# Patient Record
Sex: Female | Born: 1990 | Race: White | Hispanic: No | Marital: Married | Smoking: Former smoker
Health system: Southern US, Community
[De-identification: ages and names within clinical notes are randomized; demographics above are authoritative.]

## PROBLEM LIST (undated history)

## (undated) ENCOUNTER — Inpatient Hospital Stay: Payer: Self-pay

## (undated) DIAGNOSIS — F419 Anxiety disorder, unspecified: Secondary | ICD-10-CM

## (undated) DIAGNOSIS — E079 Disorder of thyroid, unspecified: Secondary | ICD-10-CM

## (undated) DIAGNOSIS — F32A Depression, unspecified: Secondary | ICD-10-CM

## (undated) DIAGNOSIS — O1490 Unspecified pre-eclampsia, unspecified trimester: Secondary | ICD-10-CM

## (undated) DIAGNOSIS — F329 Major depressive disorder, single episode, unspecified: Secondary | ICD-10-CM

## (undated) HISTORY — DX: Depression, unspecified: F32.A

---
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
## 2009-01-09 ENCOUNTER — Emergency Department: Payer: Self-pay | Admitting: Emergency Medicine

## 2011-04-07 ENCOUNTER — Emergency Department (HOSPITAL_COMMUNITY): Payer: Self-pay

## 2011-04-07 ENCOUNTER — Emergency Department (HOSPITAL_COMMUNITY)
Admission: EM | Admit: 2011-04-07 | Discharge: 2011-04-07 | Disposition: A | Discharge status: Home / Self Care | Payer: Self-pay | Attending: Emergency Medicine | Admitting: Emergency Medicine

## 2011-04-07 DIAGNOSIS — M25519 Pain in unspecified shoulder: Secondary | ICD-10-CM | POA: Insufficient documentation

## 2011-04-07 DIAGNOSIS — M542 Cervicalgia: Secondary | ICD-10-CM | POA: Insufficient documentation

## 2011-04-07 DIAGNOSIS — W07XXXA Fall from chair, initial encounter: Secondary | ICD-10-CM | POA: Insufficient documentation

## 2011-04-07 DIAGNOSIS — S40029A Contusion of unspecified upper arm, initial encounter: Secondary | ICD-10-CM | POA: Insufficient documentation

## 2011-04-07 DIAGNOSIS — M79609 Pain in unspecified limb: Secondary | ICD-10-CM | POA: Insufficient documentation

## 2011-04-07 DIAGNOSIS — IMO0002 Reserved for concepts with insufficient information to code with codable children: Secondary | ICD-10-CM | POA: Insufficient documentation

## 2011-04-07 DIAGNOSIS — Y929 Unspecified place or not applicable: Secondary | ICD-10-CM | POA: Insufficient documentation

## 2011-04-07 DIAGNOSIS — F17200 Nicotine dependence, unspecified, uncomplicated: Secondary | ICD-10-CM | POA: Insufficient documentation

## 2012-06-24 ENCOUNTER — Observation Stay: Payer: Self-pay | Admitting: Obstetrics and Gynecology

## 2012-06-24 LAB — URINALYSIS, COMPLETE
Bacteria: NONE SEEN
Bacteria: NONE SEEN
Bilirubin,UR: NEGATIVE
Blood: NEGATIVE
Glucose,UR: NEGATIVE mg/dL (ref 0–75)
Glucose,UR: >=500 mg/dL (ref 0–75)
Leukocyte Esterase: NEGATIVE
Leukocyte Esterase: NEGATIVE
Nitrite: NEGATIVE
Ph: 8 (ref 4.5–8.0)
Ph: 8 (ref 4.5–8.0)
RBC,UR: <1 /HPF (ref 0–5)
Specific Gravity: 1.012 (ref 1.003–1.030)
Squamous Epithelial: NONE SEEN
WBC UR: 1 /HPF (ref 0–5)

## 2012-06-24 LAB — BASIC METABOLIC PANEL
Anion Gap: 10 (ref 7–16)
Calcium, Total: 8.5 mg/dL (ref 8.5–10.1)
Co2: 21 mmol/L (ref 21–32)
EGFR (African American): >60
Osmolality: 273 (ref 275–301)
Sodium: 139 mmol/L (ref 136–145)

## 2012-08-26 ENCOUNTER — Observation Stay: Payer: Self-pay | Admitting: Obstetrics and Gynecology

## 2012-08-26 LAB — URINALYSIS, COMPLETE
Glucose,UR: 50 mg/dL (ref 0–75)
Nitrite: NEGATIVE
RBC,UR: 1 /HPF (ref 0–5)
Specific Gravity: 1.005 (ref 1.003–1.030)
WBC UR: 8 /HPF (ref 0–5)

## 2012-08-26 LAB — CBC WITH DIFFERENTIAL/PLATELET
Basophil %: 0.1 %
Eosinophil #: 0.2 10*3/uL (ref 0.0–0.7)
Eosinophil %: 1.5 %
MCH: 29.2 pg (ref 26.0–34.0)
MCHC: 34.1 g/dL (ref 32.0–36.0)
MCV: 86 fL (ref 80–100)
Neutrophil #: 7.7 10*3/uL — ABNORMAL HIGH (ref 1.4–6.5)
Neutrophil %: 69.8 %
Platelet: 343 10*3/uL (ref 150–440)
RBC: 3.7 10*6/uL — ABNORMAL LOW (ref 3.80–5.20)
RDW: 14.7 % — ABNORMAL HIGH (ref 11.5–14.5)

## 2012-10-01 ENCOUNTER — Observation Stay: Payer: Self-pay

## 2012-10-02 ENCOUNTER — Inpatient Hospital Stay: Payer: Self-pay | Admitting: Obstetrics and Gynecology

## 2012-10-02 LAB — CBC WITH DIFFERENTIAL/PLATELET
Eosinophil %: 1.2 %
HCT: 40.3 % (ref 35.0–47.0)
HGB: 13.9 g/dL (ref 12.0–16.0)
Lymphocyte #: 3 10*3/uL (ref 1.0–3.6)
MCH: 29.7 pg (ref 26.0–34.0)
MCV: 86 fL (ref 80–100)
Monocyte #: 1.2 x10 3/mm — ABNORMAL HIGH (ref 0.2–0.9)
Monocyte %: 8 %
Neutrophil #: 10 10*3/uL — ABNORMAL HIGH (ref 1.4–6.5)
Platelet: 332 10*3/uL (ref 150–440)
RBC: 4.69 10*6/uL (ref 3.80–5.20)
RDW: 15.3 % — ABNORMAL HIGH (ref 11.5–14.5)
WBC: 14.3 10*3/uL — ABNORMAL HIGH (ref 3.6–11.0)

## 2012-10-03 LAB — HEMATOCRIT: HCT: 33.2 % — ABNORMAL LOW (ref 35.0–47.0)

## 2012-12-28 ENCOUNTER — Emergency Department: Payer: Self-pay | Admitting: Internal Medicine

## 2015-05-10 NOTE — H&P (Signed)
L&D Evaluation:  History:   HPI 24 yo G1P0 @ 23.6wks EDC 10/16/12 by LMP presents from the office with nausea, vomiting and diarrhea for 2 days.  Does not note an inciting event.  No sick contacts.  Has not been able to tolerate po for 2 days.  Ketonuria in the office.  Has not vomited since admission.  Has been receiving antiemetics and fluids since admission.  Has had 2 episodes of diarrhea.  Pregnancy otherwise uncomplicated.    Presents with nausea/vomiting, diarrhea    Patient's Medical History No Chronic Illness    Patient's Surgical History Wisdom teeth    Medications Pre Natal Vitamins    Allergies NKDA    Social History none    Family History DM   ROS:   ROS per HPI, all others neg   Exam:   Vital Signs stable    Urine Protein Neg on UA    General no apparent distress    Mental Status clear    Chest clear    Heart normal sinus rhythm    Abdomen gravid, non-tender    Pelvic Deferred    Mebranes Intact    FHT 150s baseline    Ucx absent    Skin dry   Impression:   Impression 23.6wk IUP, probable viral gastroenteritis   Plan:   Plan fluids    Comments 1)  Antiemetics.  Awaiting collection of another UA for evaluation of ketones.  Will po challenge.  If able to tolerate and ketones cleared, will d/c home with antiemetics. 2)  Pt understands that only time with viral illnesses will make symptoms better.   Electronic Signatures: Senaida LangeWeaver-Lee, Kammie Scioli (MD)  (Signed 25-Jun-13 20:32)  Authored: L&D Evaluation   Last Updated: 25-Jun-13 20:32 by Senaida LangeWeaver-Lee, Sosaia Pittinger (MD)

## 2015-05-10 NOTE — H&P (Signed)
L&D Evaluation:  History:   HPI 24 yo G1P0 at 37 weeks 6 days presents to L&D with c/o ctxs. EDD 10/16/12. Had intercourse last night and ctx's started after that. No LOF, [facility], no significant events during pregnancy.    Presents with contractions    Patient's Medical History No Chronic Illness    Patient's Surgical History none    Medications Pre Natal Vitamins    Allergies NKDA    Social History none    Family History Non-Contributory   ROS:   ROS All systems were reviewed.  HEENT, CNS, GI, GU, Respiratory, CV, Renal and Musculoskeletal systems were found to be normal.   Exam:   Vital Signs stable    General no apparent distress    Mental Status clear    Abdomen gravid, tender with contractions    Estimated Fetal Weight Average for gestational age    Pelvic no external lesions, 1/50/-3    Mebranes Intact    FHT normal rate with no decels    Ucx regular, 2-5   Impression:   Impression contractions at 37+ weeks   Plan:   Plan EFM/NST, monitor contractions and for cervical change, discharge, IM demerol and phenergan, rest, dc home    Comments cervix unchanged after several checks, pt tearful and in pain, had discussion about labor ctx's vs false labor. pt verbalized understanding. Will give pain meds and phenergan to go home with. has appt in 2 days at office   Electronic Signatures: Shella Maximutnam, Shyam Dawson (CNM)  (Signed 03-Oct-13 05:45)  Authored: L&D Evaluation   Last Updated: 03-Oct-13 05:45 by Shella MaximPutnam, Hinata Diener (CNM)

## 2015-05-10 NOTE — H&P (Signed)
L&D Evaluation:  History:   HPI 24 yo G1P0 @ 32wks 5d days gestation by LMP derived EDC of 10/16/12 presents with contractions and abdominal pain.  No fevers, no chills, no nausea, no emesis. +FM, no LOF, no VB    Presents with contractions    Patient's Medical History No Chronic Illness    Patient's Surgical History Wisdom teeth    Medications Pre Natal Vitamins    Allergies NKDA    Social History none    Family History DM   Exam:   Vital Signs stable    Urine Protein trace    General no apparent distress    Mental Status clear    Heart normal sinus rhythm    Estimated Fetal Weight Average for gestational age    Back no CVAT    Edema no edema    Reflexes 1+    Pelvic no external lesions, cervix closed and thick    Mebranes Intact    FHT normal rate with no decels    FHT Description irritability    Ucx absent   Impression:   Impression Preterm contraction   Plan:   Plan EFM/NST, monitor contractions and for cervical change    Comments - reactive NST - No change over prolonged monitoring - UA and CBC negative - Pain improved after IV hydration - Has follow up tomorrow    Follow Up Appointment already scheduled   Electronic Signatures: Lorrene ReidStaebler, Rhenda Oregon M (MD)  (Signed 27-Aug-13 17:45)  Authored: L&D Evaluation   Last Updated: 27-Aug-13 17:45 by Lorrene ReidStaebler, Lukisha Procida M (MD)

## 2015-05-10 NOTE — H&P (Signed)
L&D Evaluation:  History:   HPI 24 yo G1P0 at 2538 weeks presents to L&D with SROM. EDD 10/16/12. Has had ctx's off an on for a few days, was here yesterday morning with no cervical change, rec'd pain meds and sent home to rest.  [facility], no significant events during pregnancy.    Presents with contractions    Patient's Medical History No Chronic Illness    Patient's Surgical History none    Medications Pre Natal Vitamins    Allergies NKDA    Social History none    Family History Non-Contributory   ROS:   ROS All systems were reviewed.  HEENT, CNS, GI, GU, Respiratory, CV, Renal and Musculoskeletal systems were found to be normal.   Exam:   Vital Signs stable    General no apparent distress    Mental Status clear    Abdomen gravid, tender with contractions    Estimated Fetal Weight Average for gestational age    Pelvic no external lesions, 1.5/50/-2 per RN    Mebranes Ruptured    Description clear    FHT normal rate with no decels    Ucx regular, 2-5   Impression:   Impression early labor, SROM 38 weeks   Plan:   Plan EFM/NST, antibiotics for GBBS prophylaxis, admit for labor, will check for cervical change in a few hours, may need Pitocin   Electronic Signatures: Shella Maximutnam, Jyrah Blye (CNM)  (Signed 03-Oct-13 05:44)  Authored: L&D Evaluation   Last Updated: 03-Oct-13 05:44 by Shella MaximPutnam, Lydiah Pong (CNM)

## 2015-08-11 ENCOUNTER — Encounter (HOSPITAL_COMMUNITY): Payer: Self-pay | Admitting: Emergency Medicine

## 2015-08-11 ENCOUNTER — Emergency Department (HOSPITAL_COMMUNITY): Payer: Self-pay

## 2015-08-11 ENCOUNTER — Emergency Department (HOSPITAL_COMMUNITY)
Admission: EM | Admit: 2015-08-11 | Discharge: 2015-08-11 | Disposition: A | Discharge status: Home / Self Care | Payer: Self-pay | Attending: Emergency Medicine | Admitting: Emergency Medicine

## 2015-08-11 DIAGNOSIS — M549 Dorsalgia, unspecified: Secondary | ICD-10-CM | POA: Insufficient documentation

## 2015-08-11 DIAGNOSIS — S2232XA Fracture of one rib, left side, initial encounter for closed fracture: Secondary | ICD-10-CM | POA: Insufficient documentation

## 2015-08-11 DIAGNOSIS — R51 Headache: Secondary | ICD-10-CM | POA: Insufficient documentation

## 2015-08-11 DIAGNOSIS — X58XXXA Exposure to other specified factors, initial encounter: Secondary | ICD-10-CM | POA: Insufficient documentation

## 2015-08-11 DIAGNOSIS — M542 Cervicalgia: Secondary | ICD-10-CM | POA: Insufficient documentation

## 2015-08-11 DIAGNOSIS — R110 Nausea: Secondary | ICD-10-CM | POA: Insufficient documentation

## 2015-08-11 DIAGNOSIS — Y9389 Activity, other specified: Secondary | ICD-10-CM | POA: Insufficient documentation

## 2015-08-11 DIAGNOSIS — Y9289 Other specified places as the place of occurrence of the external cause: Secondary | ICD-10-CM | POA: Insufficient documentation

## 2015-08-11 DIAGNOSIS — M25512 Pain in left shoulder: Secondary | ICD-10-CM | POA: Insufficient documentation

## 2015-08-11 DIAGNOSIS — Y999 Unspecified external cause status: Secondary | ICD-10-CM | POA: Insufficient documentation

## 2015-08-11 DIAGNOSIS — R630 Anorexia: Secondary | ICD-10-CM | POA: Insufficient documentation

## 2015-08-11 DIAGNOSIS — R0602 Shortness of breath: Secondary | ICD-10-CM | POA: Insufficient documentation

## 2015-08-11 DIAGNOSIS — R0781 Pleurodynia: Secondary | ICD-10-CM

## 2015-08-11 DIAGNOSIS — R05 Cough: Secondary | ICD-10-CM | POA: Insufficient documentation

## 2015-08-11 MED ORDER — METHOCARBAMOL 500 MG PO TABS: 500.0000 mg | ORAL_TABLET | Freq: Two times a day (BID) | ORAL | Status: DC | PRN

## 2015-08-11 MED ORDER — OXYCODONE-ACETAMINOPHEN 5-325 MG PO TABS: 2.0000 | ORAL_TABLET | ORAL | Status: DC | PRN

## 2015-08-11 MED ORDER — IBUPROFEN 800 MG PO TABS: 800.0000 mg | ORAL_TABLET | Freq: Three times a day (TID) | ORAL | Status: DC

## 2015-08-11 MED ORDER — OXYCODONE-ACETAMINOPHEN 5-325 MG PO TABS
2.0000 | ORAL_TABLET | Freq: Once | ORAL | Status: AC
  Administered 2015-08-11: 2 via ORAL
  Filled 2015-08-11: qty 2

## 2015-08-11 NOTE — ED Notes (Signed)
Transported to X-ray

## 2015-08-11 NOTE — ED Provider Notes (Signed)
CSN: [id number]     Arrival date & time 08/11/15  4098 History   First MD Initiated Contact with Patient 08/11/15 0735     Chief Complaint  Patient presents with  . Rib Injury    The history is provided by the patient. No language interpreter was used.     Carolyn Bennett is a 24 y.o. female with no significant PMH who presents to the ED with left rib pain. She reports she was at the gym lifting weights last Wednesday when she felt "a popping sensation" in her left ribcage. She states her pain is constant and has progressively worsened over the past week. She reports her pain radiates to her neck, back, and chest. She states movement and deep inspiration exacerbate her pain. She has tried heat, ice, and ibuprofen with no symptom relief. She states she has not been able to eat or sleep due to pain. She reports shortness of breath and states she has developed a cough productive of dark sputum. She denies recent illness. She denies fever, chills, palpitations, leg swelling, recent travel or immobility, history of DVT, history of malignancy, estrogen use, tobacco use.    History reviewed. No pertinent past medical history. History reviewed. No pertinent past surgical history. No family history on file. Social History  Substance Use Topics  . Smoking status: Never Smoker   . Smokeless tobacco: None  . Alcohol Use: No   OB History    No data available      Review of Systems  Constitutional: Positive for activity change and appetite change. Negative for fever and chills.       Reports she has been unable to eat and sleep due to pain.  HENT: Negative for congestion and rhinorrhea.   Respiratory: Positive for cough and shortness of breath.        Reports cough productive of dark sputum.  Cardiovascular: Positive for chest pain. Negative for palpitations and leg swelling.       Reports left sided chest wall pain.  Gastrointestinal: Positive for nausea. Negative for vomiting, abdominal  pain, diarrhea, constipation and abdominal distention.       Reports nausea, which she attributes to pain.  Genitourinary: Negative for dysuria, urgency and frequency.  Musculoskeletal: Positive for myalgias, back pain, arthralgias and neck pain.       Reports left neck pain, left back pain, left rib pain.  Skin: Negative for color change, pallor, rash and wound.  Neurological: Positive for headaches. Negative for dizziness, weakness, light-headedness and numbness.      Allergies  Review of patient's allergies indicates no known allergies.  Home Medications   Prior to Admission medications   Medication Sig Start Date End Date Taking? Authorizing Provider  ibuprofen (ADVIL,MOTRIN) 800 MG tablet Take 1 tablet (800 mg total) by mouth 3 (three) times daily. 08/11/15   Mady Gemma, PA-C  methocarbamol (ROBAXIN) 500 MG tablet Take 1 tablet (500 mg total) by mouth 2 (two) times daily as needed for muscle spasms. 08/11/15   Mady Gemma, PA-C  oxyCODONE-acetaminophen (PERCOCET/ROXICET) 5-325 MG per tablet Take 2 tablets by mouth every 4 (four) hours as needed for severe pain. 08/11/15   Mady Gemma, PA-C    BP 131/61 mmHg  Pulse 70  Temp(Src) 98.1 F (36.7 C) (Oral)  Resp 18  SpO2 97%  LMP 07/21/2015 (Exact Date) Physical Exam  Constitutional: She is oriented to person, place, and time. She appears well-developed and well-nourished.  In moderate distress  due to pain.  HENT:  Head: Normocephalic and atraumatic.  Right Ear: External ear normal.  Left Ear: External ear normal.  Mouth/Throat: Oropharynx is clear and moist and mucous membranes are normal.  Tenderness to palpation over left trapezius. No midline tenderness, stepoff, or deformity.   Eyes: Conjunctivae are normal. Pupils are equal, round, and reactive to light.  Neck: Normal range of motion. Neck supple. Muscular tenderness present. No spinous process tenderness present.  Cardiovascular: Normal rate,  regular rhythm, normal heart sounds and intact distal pulses.   Pulmonary/Chest: Effort normal and breath sounds normal. No respiratory distress. She has no wheezes. She has no rales. She exhibits tenderness and bony tenderness. She exhibits no crepitus and no deformity.  Tenderness to palpation of left chest wall and left lower ribs.   Abdominal: Soft. She exhibits no distension and no mass. There is no tenderness. There is no rebound and no guarding.  Musculoskeletal: She exhibits tenderness. She exhibits no edema.  Tenderness to palpation over left thoracic paraspinal muscles. No midline tenderness, stepoff, or deformity. Decreased range of motion of left upper extremity due to pain in left chest wall.  Neurological: She is alert and oriented to person, place, and time. No cranial nerve deficit.  Skin: Skin is warm and dry. No rash noted. No erythema. No pallor.  Psychiatric: She has a normal mood and affect. Her behavior is normal. Judgment and thought content normal.  Nursing note and vitals reviewed.   ED Course  Procedures (including critical care time)  Labs Review Labs Reviewed - No data to display  Imaging Review Dg Ribs Unilateral W/chest Left  08/11/2015   CLINICAL DATA:  Left rib pain after an injury lifting weights.  EXAM: LEFT RIBS AND CHEST - 3+ VIEW  COMPARISON:  None.  FINDINGS: There is a slightly displaced fracture anterior aspect of the left seventh rib just lateral to the costochondral junction. The other ribs are normal. No pneumothorax or lung contusion. The lungs are clear. Heart size and vascularity are normal.  IMPRESSION: Acute fracture of the anterior aspect of the left seventh rib.   Electronically Signed   By: Francene Boyers M.D.   On: 08/11/2015 08:43     EKG Interpretation None      MDM   Final diagnoses:  Rib pain on left side  Left rib fracture, closed, initial encounter    24 year old female presents with left rib pain since last Wednesday, which  occurred after lifting weights at the gym and feeling a "popping sensation" in her left lower ribs.   Patient reports radiation of pain to neck and back. Left trapezius and left thoracic paraspinal muscles tender to palpation. No midline tenderness, stepoff, or deformity. Decreased range of motion of left upper extremity due to pain. Patient is able to ambulate, though states is painful. No bowel or bladder incontinence or saddle anesthesia. No focal neuro deficits. No evidence for cauda equina. No fever, h/o malignancy, IVDU.   Patient reports headache to the left side of her head. She is afebrile and has no nuchal rigidity or changes in vision. No focal neuro deficits. No evidence of acute intracranial process or meningitis.   Chest pain reproducible with palpation of left chest wall. Reports shortness of breath and cough productive of dark sputum. Patient is afebrile. No tachypnea noted in the ED. O2 sat 97% on RA. PERC negative. No evidence of PE. Chest x-ray negative for infection. Doubt pneumonia.   Pain controlled in the  ED with percocet. Chest x-ray demonstrates slightly displaced acute fracture of the anterior aspect of the left seventh rib, likely due to trauma from lifting weights. No pneumothorax or lung contusion. No evidence of pathologic fracture. Neck and back pain likely related to muscle spasm due to patient's significant rib pain. Patient to be discharged home with ibuprofen, robaxin, and percocet, and can continue to use heat and ice for symptom relief. Instructed on incentive spirometer use. Return precautions discussed and work note with heavy lifting precautions given. Patient to follow-up with PCP this week.  BP 131/61 mmHg  Pulse 70  Temp(Src) 98.1 F (36.7 C) (Oral)  Resp 18  SpO2 97%  LMP 07/21/2015 (Exact Date)   Mady Gemma, PA-C 08/11/15 4540  Mady Gemma, PA-C 08/11/15 1208  Laurence Spates, MD 08/11/15 [phone]

## 2015-08-11 NOTE — Discharge Instructions (Signed)
1. Medications: percocet, ibuprofen, robaxin, usual home medications 2. Treatment: rest, drink plenty of fluids, use incentive spirometer 3. Follow Up: please followup with your primary doctor this week for discussion of your diagnoses and further evaluation after today's visit; if you do not have a primary care doctor use the resource guide provided to find one; please return to the ER for fever, chills, worsening pain, worsening shortness of breath  Rib Fracture A rib fracture is a break or crack in one of the bones of the ribs. The ribs are a group of long, curved bones that wrap around your chest and attach to your spine. They protect your lungs and other organs in the chest cavity. A broken or cracked rib is often painful, but most do not cause other problems. Most rib fractures heal on their own over time. However, rib fractures can be more serious if multiple ribs are broken or if broken ribs move out of place and push against other structures. CAUSES   A direct blow to the chest. For example, this could happen during contact sports, a car accident, or a fall against a hard object.  Repetitive movements with high force, such as pitching a baseball or having severe coughing spells. SYMPTOMS   Pain when you breathe in or cough.  Pain when someone presses on the injured area. DIAGNOSIS  Your caregiver will perform a physical exam. Various imaging tests may be ordered to confirm the diagnosis and to look for related injuries. These tests may include a chest X-ray, computed tomography (CT), magnetic resonance imaging (MRI), or a bone scan. TREATMENT  Rib fractures usually heal on their own in 1-3 months. The longer healing period is often associated with a continued cough or other aggravating activities. During the healing period, pain control is very important. Medication is usually given to control pain. Hospitalization or surgery may be needed for more severe injuries, such as those in which  multiple ribs are broken or the ribs have moved out of place.  HOME CARE INSTRUCTIONS   Avoid strenuous activity and any activities or movements that cause pain. Be careful during activities and avoid bumping the injured rib.  Gradually increase activity as directed by your caregiver.  Only take over-the-counter or prescription medications as directed by your caregiver. Do not take other medications without asking your caregiver first.  Apply ice to the injured area for the first 1-2 days after you have been treated or as directed by your caregiver. Applying ice helps to reduce inflammation and pain.  Put ice in a plastic bag.  Place a towel between your skin and the bag.   Leave the ice on for 15-20 minutes at a time, every 2 hours while you are awake.  Perform deep breathing as directed by your caregiver. This will help prevent pneumonia, which is a common complication of a broken rib. Your caregiver may instruct you to:  Take deep breaths several times a day.  Try to cough several times a day, holding a pillow against the injured area.  Use a device called an incentive spirometer to practice deep breathing several times a day.  Drink enough fluids to keep your urine clear or pale yellow. This will help you avoid constipation.   Do not wear a rib belt or binder. These restrict breathing, which can lead to pneumonia.  SEEK IMMEDIATE MEDICAL CARE IF:   You have a fever.   You have difficulty breathing or shortness of breath.   You develop  a continual cough, or you cough up thick or bloody sputum.  You feel sick to your stomach (nausea), throw up (vomit), or have abdominal pain.   You have worsening pain not controlled with medications.  MAKE SURE YOU:  Understand these instructions.  Will watch your condition.  Will get help right away if you are not doing well or get worse. Document Released: 12/17/2005 Document Revised: 08/19/2013 Document Reviewed:  02/18/2013 [facility] Patient Information 2015 [place], [place]. This information is not intended to replace advice given to you by your health care provider. Make sure you discuss any questions you have with your health care provider.   Emergency Department Resource Guide 1) Find a Doctor and Pay Out of Pocket Although you won't have to find out who is covered by your insurance plan, it is a good idea to ask around and get recommendations. You will then need to call the office and see if the doctor you have chosen will accept you as a new patient and what types of options they offer for patients who are self-pay. Some doctors offer discounts or will set up payment plans for their patients who do not have insurance, but you will need to ask so you aren't surprised when you get to your appointment.  2) Contact Your Local Health Department Not all health departments have doctors that can see patients for sick visits, but many do, so it is worth a call to see if yours does. If you don't know where your local health department is, you can check in your phone book. The CDC also has a tool to help you locate your state's health department, and many state websites also have listings of all of their local health departments.  3) Find a Walk-in Clinic If your illness is not likely to be very severe or complicated, you may want to try a walk in clinic. These are popping up all over the country in pharmacies, drugstores, and shopping centers. They're usually staffed by nurse practitioners or physician assistants that have been trained to treat common illnesses and complaints. They're usually fairly quick and inexpensive. However, if you have serious medical issues or chronic medical problems, these are probably not your best option.  No Primary Care Doctor: - Call Health Connect at  [phone] - they can help you locate a primary care doctor that  accepts your insurance, provides certain services, etc. - Physician  Referral Service- [phone]  Chronic Pain Problems: Organization         Address  Phone   Notes  [facility]  [phone] Patients need to be referred by their primary care doctor.   Medication Assistance: Organization         Address  Phone   Notes  [facility] Medication [facility] [address] [place] [place], [place] [address] [phone] --Must be a resident of [facility] -- Must have NO insurance coverage whatsoever (no Medicaid/ Medicare, etc.) -- The pt. MUST have a primary care doctor that directs their care regularly and follows them in the community   [facility]ist  [phone]   [employer]  [phone]    Agencies that provide inexpensive medical care: Organization         Address  Phone   Notes  [facility]  [phone]   [facility]    [phone]   [facility] [address] [place], [place] [address] [phone]  [phone]   [facility] [address] [place] [phone]   Planned Parenthood    [phone]   [facility]    [phone]   [facility]  [address], [place] Phone:  [phone], Fax:  [phone] Hours of Operation:  9 am - 6 pm, M-F.  Also accepts Medicaid/Medicare and self-pay.  [facility]  [address], [place] Phone: [phone], Fax: [phone]. Hours of Operation:  8:30 am - 5:30 pm, M-F.  Also accepts Medicaid and self-pay.  [facility] [place] [address], [place] Phone: [phone]   [facility] [address] Natasha Bence [place], [place] [phone], Ext. [phone] Mondays & Thursdays: 7-9 AM.  First 15 patients are seen on a first come, first serve basis.    Medicaid-accepting [facility] Providers:  Organization         Address  Phone   Notes  [facility] [address], [place] [phone] Also accepts self-pay patients.  [facility] [address] Laurell Josephs [place], [place]  [phone]   [facility] [address], [place] [phone]   [facility] [address], [place] [phone]   Renaye Rakers [address], [place]   [phone] Only accepts [place] Access [place] patients after they have their name applied to their card.   Self-Pay (no insurance) in [facility]:  Organization         Address  Phone   Notes  Sickle Cell Patients, [facility] Internal Medicine [address] [place], [place] [phone]   [facility] [address] [place], [place] [phone]   [facility] [place]  [address], [place] [phone]   [facility]/Dr. Osei-Bonsu  [address], [place] or [address], [place] [phone] Phone number for both [place] and [place] locations is the same.  [facility]gent [facility] and [facility] [place] [facility] [address] [place] [phone]   [facility] [address] [place] or [address] [phone]   [facility] [address] [place] [phone], phone; [phone], fax Sees patients 1st and 3rd Saturday of every month.  Must not qualify for public or private insurance (i.e. Medicaid, Medicare, [place] Health Choice, Veterans' Benefits)  Household income should be no more than 200% of the poverty level The clinic cannot treat you if you are pregnant or think you are pregnant  Sexually transmitted diseases are not treated at the clinic.    Dental Care: Organization         Address  Phone  Notes  [facility] Department of [facility] [address] [place], [place] [phone] Accepts children up to age 61 who are enrolled  in [place] or [place] Health Choice; pregnant women with a Medicaid card; and children who have applied for Medicaid or [place] Health Choice, but were declined, whose parents can pay a reduced fee at time of service.  [facility] Department of [facility]  [address], [place] [phone] Accepts children up to age 70 who are enrolled in [place] or [place] Health Choice; pregnant women with a  Medicaid card; and children who have applied for Medicaid or [place] Health Choice, but were declined, whose parents can pay a reduced fee at time of service.  [facility]  [address] [place], [place] [phone] Patients are seen by appointment only. Walk-ins are not accepted. [facility] will see patients 13 years of age and older. Monday - Tuesday (8am-5pm) Most Wednesdays (8:30-5pm) $30 per visit, cash only  [facility]  [address], [facility] [phone] Patients are seen by appointment only. Walk-ins are not accepted. [facility] will see patients 14 years of age and older. One Wednesday Evening (Monthly: Volunteer Based).  $30 per visit, cash only  [employer]  [phone] for adults; Children under age 73, call Graduate Pediatric Dentistry at [phone]. Children aged 74-14, please call [phone] to request a pediatric application.  Dental services are provided in all areas of dental care including fillings, crowns and bridges, complete and partial dentures, implants, gum treatment, root canals, and extractions. Preventive care is also provided. Treatment is provided to both adults and children. Patients are selected via a lottery and there is often a waiting list.   [facility] [address] [place]  [phone] [email or web address]   [facility] [address] [place], [place] [phone], Ext. [phone] Second and Fourth Thursday of each month, opens at  6:30 AM; Clinic ends at 9 AM.  Patients are seen on a first-come first-served basis, and a limited number are seen during each clinic.   [facility]  [address] Ether Griffins [place], [place] [phone]   Eligibility Requirements You must have lived in [place], [place], or [place] counties for at least the last three months.   You cannot be eligible for state or federal sponsored [place], including [employer], [place], or [employer].   You generally cannot be eligible for healthcare insurance through your employer.    How to apply: Eligibility screenings are held every Tuesday and Wednesday afternoon from 1:00 pm until 4:00 pm. You do not need an appointment for the interview!  [facility] [address] [place], [place] [phone]   [facility] Health Department  [phone]   [facility] Health Department  [phone]   [facility] Health Department  [phone]    Behavioral Health Resources in the Community: Intensive Outpatient Programs Organization         Address  Phone  Notes  [facility] Services [address], [place], [place] [phone]   [facility] [address], [place], [place] [phone]   [facility] [address], [place], [place]  [phone]   [facility] Mental Health [address],  [place], [place] [phone] or [phone]   Substance Abuse Resources Organization         Address  Phone  Notes  [facility] Drug [facility]  [phone]   [facility]  [phone]   [facility] [place]  [phone]   Floydene Flock  [phone]   Residential & Outpatient Substance Abuse Program  [phone]   Psychological Services Organization         Address  Phone  Notes  [facility] Behavior[facility] Health  [phone]   [facility]  [phone]   [facility] Mental Health [address] [place]  [phone] or [phone]    Mobile Crisis Teams Organization  Address  Phone  Notes  [facility], Mobile Crisis Care Unit  [phone]   [facility]  [address] [place], [place]   [facility] [address], [place] [phone]    Self-Help/Support Groups Organization         Address  Phone             Notes  [place]. of [place] - variety of support groups  [place] Call for more information  Narcotics Anonymous (NA), [facility] [address], [employer] [place]  2 meetings at this location   Special educational needs teacher         Address  Phone  Notes  [facility] [place],    [place]  [phone]   [facility]  [address] [place] [phone], [place], [place]   [place], [place] [phone] Admissions: 8am-3pm M-F  Incentives Substance [place] [phone],    [place], [place] [phone]   [facility] [address] [place], [place], [place]   [facility] [address]  [place], [place]   [facility] - Intensive Outpatient [address] Kristeen Mans [address], [place], [place]   [facility] [place]  [employer] [place], [place] [phone] or [phone]   [facility] ([facility]) [address] [place], [place] Accepts Medicaid  [facility] [address]  [place] [phone])[phone] Substance Abuse/Addiction Treatment   [facility] Organization         Address  Phone  Notes  [facility]  [phone]   Domenic Schwab, PhD [address] Arlis [place], [place]   [phone] or [phone]   [place], [place] [phone]   [facility]  [address], [place], [place] [phone] Insurance/Medicaid/sponsorship through [facility] and Families [address]                                    [place], [place] [phone] [place] [address], [place] [phone]    Dr. Adele Schilder  [phone]   [facility] Dept. 1) [address] [place] 2) [place] 3)  [place] [address], [place] [phone]  [phone]   [place] [phone] or [phone] (After Hours)

## 2015-08-11 NOTE — ED Notes (Signed)
Pt reports that while lifting weights last week she felt a pop in her left lower ribcage. Pt reports the pain has increased in the past two days and she has been having more pain with breathing. Pt also reports coughing up dark sputum. Pt alert x4. NAD at this time.

## 2016-01-26 DIAGNOSIS — J3501 Chronic tonsillitis: Secondary | ICD-10-CM | POA: Insufficient documentation

## 2016-10-29 ENCOUNTER — Emergency Department
Admission: EM | Admit: 2016-10-29 | Discharge: 2016-10-29 | Disposition: A | Discharge status: Home / Self Care | Payer: Medicaid Other | Attending: Emergency Medicine | Admitting: Emergency Medicine

## 2016-10-29 ENCOUNTER — Encounter: Payer: Self-pay | Admitting: Emergency Medicine

## 2016-10-29 ENCOUNTER — Emergency Department: Payer: Medicaid Other

## 2016-10-29 DIAGNOSIS — R1011 Right upper quadrant pain: Secondary | ICD-10-CM

## 2016-10-29 DIAGNOSIS — E876 Hypokalemia: Secondary | ICD-10-CM | POA: Diagnosis not present

## 2016-10-29 DIAGNOSIS — O99281 Endocrine, nutritional and metabolic diseases complicating pregnancy, first trimester: Secondary | ICD-10-CM | POA: Insufficient documentation

## 2016-10-29 DIAGNOSIS — R1013 Epigastric pain: Secondary | ICD-10-CM | POA: Insufficient documentation

## 2016-10-29 DIAGNOSIS — Z3A08 8 weeks gestation of pregnancy: Secondary | ICD-10-CM | POA: Insufficient documentation

## 2016-10-29 DIAGNOSIS — R197 Diarrhea, unspecified: Secondary | ICD-10-CM | POA: Diagnosis not present

## 2016-10-29 DIAGNOSIS — O26891 Other specified pregnancy related conditions, first trimester: Secondary | ICD-10-CM | POA: Diagnosis not present

## 2016-10-29 DIAGNOSIS — Z791 Long term (current) use of non-steroidal anti-inflammatories (NSAID): Secondary | ICD-10-CM | POA: Diagnosis not present

## 2016-10-29 DIAGNOSIS — O99611 Diseases of the digestive system complicating pregnancy, first trimester: Secondary | ICD-10-CM | POA: Diagnosis not present

## 2016-10-29 DIAGNOSIS — E871 Hypo-osmolality and hyponatremia: Secondary | ICD-10-CM | POA: Insufficient documentation

## 2016-10-29 DIAGNOSIS — O219 Vomiting of pregnancy, unspecified: Secondary | ICD-10-CM | POA: Diagnosis present

## 2016-10-29 DIAGNOSIS — N898 Other specified noninflammatory disorders of vagina: Secondary | ICD-10-CM | POA: Insufficient documentation

## 2016-10-29 DIAGNOSIS — O210 Mild hyperemesis gravidarum: Secondary | ICD-10-CM | POA: Insufficient documentation

## 2016-10-29 LAB — URINALYSIS COMPLETE WITH MICROSCOPIC (ARMC ONLY)
BILIRUBIN URINE: NEGATIVE
Bacteria, UA: NONE SEEN
GLUCOSE, UA: NEGATIVE mg/dL
HGB URINE DIPSTICK: NEGATIVE
LEUKOCYTES UA: NEGATIVE
Nitrite: NEGATIVE
Protein, ur: NEGATIVE mg/dL
Specific Gravity, Urine: 1.014 (ref 1.005–1.030)
pH: 6 (ref 5.0–8.0)

## 2016-10-29 LAB — BASIC METABOLIC PANEL
ANION GAP: 9 (ref 5–15)
BUN: <5 mg/dL — ABNORMAL LOW (ref 6–20)
CALCIUM: 9.4 mg/dL (ref 8.9–10.3)
CHLORIDE: 101 mmol/L (ref 101–111)
CO2: 21 mmol/L — AB (ref 22–32)
Creatinine, Ser: 0.58 mg/dL (ref 0.44–1.00)
GFR calc non Af Amer: >60 mL/min (ref >60)
GLUCOSE: 103 mg/dL — AB (ref 65–99)
POTASSIUM: 3.1 mmol/L — AB (ref 3.5–5.1)
Sodium: 131 mmol/L — ABNORMAL LOW (ref 135–145)

## 2016-10-29 LAB — OB RESULTS CONSOLE VARICELLA ZOSTER ANTIBODY, IGG: Varicella: IMMUNE

## 2016-10-29 LAB — CBC WITH DIFFERENTIAL/PLATELET
BASOS ABS: 0 10*3/uL (ref 0–0.1)
BASOS PCT: 0 %
Eosinophils Absolute: 0.1 10*3/uL (ref 0–0.7)
Eosinophils Relative: 1 %
HEMATOCRIT: 40.3 % (ref 35.0–47.0)
HEMOGLOBIN: 13.9 g/dL (ref 12.0–16.0)
LYMPHS PCT: 17 %
Lymphs Abs: 1.8 10*3/uL (ref 1.0–3.6)
MCH: 29.7 pg (ref 26.0–34.0)
MCHC: 34.5 g/dL (ref 32.0–36.0)
MCV: 86 fL (ref 80.0–100.0)
MONO ABS: 0.6 10*3/uL (ref 0.2–0.9)
Monocytes Relative: 6 %
NEUTROS ABS: 7.9 10*3/uL — AB (ref 1.4–6.5)
NEUTROS PCT: 76 %
Platelets: 288 10*3/uL (ref 150–440)
RBC: 4.68 MIL/uL (ref 3.80–5.20)
RDW: 12.4 % (ref 11.5–14.5)
WBC: 10.4 10*3/uL (ref 3.6–11.0)

## 2016-10-29 LAB — INFLUENZA PANEL BY PCR (TYPE A & B)
H1N1FLUPCR: NOT DETECTED
INFLAPCR: NEGATIVE
INFLBPCR: NEGATIVE

## 2016-10-29 LAB — CHLAMYDIA/NGC RT PCR (ARMC ONLY)
CHLAMYDIA TR: NOT DETECTED
N GONORRHOEAE: NOT DETECTED

## 2016-10-29 LAB — OB RESULTS CONSOLE RUBELLA ANTIBODY, IGM: RUBELLA: IMMUNE

## 2016-10-29 LAB — OB RESULTS CONSOLE HEPATITIS B SURFACE ANTIGEN: Hepatitis B Surface Ag: NEGATIVE

## 2016-10-29 LAB — HCG, QUANTITATIVE, PREGNANCY: hCG, Beta Chain, Quant, S: 136299 m[IU]/mL — ABNORMAL HIGH (ref <5)

## 2016-10-29 LAB — WET PREP, GENITAL
CLUE CELLS WET PREP: NONE SEEN
Sperm: NONE SEEN
Trich, Wet Prep: NONE SEEN
Yeast Wet Prep HPF POC: NONE SEEN

## 2016-10-29 LAB — OB RESULTS CONSOLE HIV ANTIBODY (ROUTINE TESTING): HIV: NONREACTIVE

## 2016-10-29 MED ORDER — POTASSIUM CHLORIDE CRYS ER 20 MEQ PO TBCR
40.0000 meq | EXTENDED_RELEASE_TABLET | Freq: Once | ORAL | Status: AC
  Administered 2016-10-29: 40 meq via ORAL

## 2016-10-29 MED ORDER — GI COCKTAIL ~~LOC~~
30.0000 mL | Freq: Once | ORAL | Status: AC
  Administered 2016-10-29: 30 mL via ORAL

## 2016-10-29 MED ORDER — SODIUM CHLORIDE 0.9 % IV BOLUS (SEPSIS)
1000.0000 mL | Freq: Once | INTRAVENOUS | Status: AC
  Administered 2016-10-29: 1000 mL via INTRAVENOUS

## 2016-10-29 MED ORDER — PROMETHAZINE HCL 25 MG RE SUPP: 25.0000 mg | Freq: Four times a day (QID) | RECTAL | 0 refills | Status: DC | PRN

## 2016-10-29 MED ORDER — SODIUM CHLORIDE 0.9 % IV BOLUS (SEPSIS): 1000.0000 mL | Freq: Once | INTRAVENOUS | Status: DC

## 2016-10-29 MED ORDER — GI COCKTAIL ~~LOC~~
ORAL | Status: AC
  Administered 2016-10-29: 30 mL via ORAL
  Filled 2016-10-29: qty 30

## 2016-10-29 MED ORDER — ONDANSETRON HCL 4 MG/2ML IJ SOLN
INTRAMUSCULAR | Status: DC
Start: 2016-10-29 — End: 2016-10-30
  Filled 2016-10-29: qty 2

## 2016-10-29 MED ORDER — ONDANSETRON HCL 4 MG/2ML IJ SOLN
4.0000 mg | Freq: Once | INTRAMUSCULAR | Status: AC
  Administered 2016-10-29: 4 mg via INTRAVENOUS

## 2016-10-29 MED ORDER — POTASSIUM CHLORIDE CRYS ER 20 MEQ PO TBCR
EXTENDED_RELEASE_TABLET | ORAL | Status: AC
  Administered 2016-10-29: 40 meq via ORAL
  Filled 2016-10-29: qty 2

## 2016-10-29 MED ORDER — ONDANSETRON 4 MG PO TBDP: 4.0000 mg | ORAL_TABLET | Freq: Three times a day (TID) | ORAL | 0 refills | Status: DC | PRN

## 2016-10-29 NOTE — ED Triage Notes (Signed)
C/O emesis x 2-3 weeks.  This morning c/o diarrhea and abdominal cramping.  Patient is [redacted] weeks pregnant.  Denies vaginal bleeding.  P2 G 1

## 2016-10-29 NOTE — ED Notes (Signed)
Pt offered oral fluid for PO challenge.

## 2016-10-29 NOTE — ED Provider Notes (Addendum)
[facility] Emergency Department Provider Note  ____________________________________________  Time seen: Approximately 8:36 PM  I have reviewed the triage vital signs and the nursing notes.   HISTORY  Chief Complaint Emesis During Pregnancy    HPI Carolyn Bennett is a 25 y.o. female G2P1 approximately [redacted] weeks pregnant presenting with diarrhea. The patient reports that for the entirety of the pregnancy she has had nausea and vomiting, which has been worse over the last several days. She is unable to keep down any food. She is not seen an obstetrician, had an ultrasound, or tried any antiemetics. This morning she woke up with epigastric discomfort as well as multiple episodes of nonbloody diarrhea. She also reports an increase in her vaginal discharge. No fevers, chills, known sick contacts.   History reviewed. No pertinent past medical history.  There are no active problems to display for this patient.   History reviewed. No pertinent surgical history.  Current Outpatient Rx  . Order #: [id number] Class: Print  . Order #: [id number] Class: Print  . Order #: [id number] Class: Print  . Order #: [id number] Class: Print  . Order #: [id number] Class: Print    Allergies Review of patient's allergies indicates no known allergies.  No family history on file.  Social History Social History  Substance Use Topics  . Smoking status: Never Smoker  . Smokeless tobacco: Never Used  . Alcohol use No    Review of Systems Constitutional: No fever/chills.No lightheadedness or syncope. Eyes: No visual changes. ENT: No sore throat. No congestion or rhinorrhea. Cardiovascular: Denies chest pain. Denies palpitations. Respiratory: Denies shortness of breath.  No cough. Gastrointestinal: As of epigastric abdominal pain.  Positive nausea, positive vomiting.  Positive diarrhea.  No constipation. Genitourinary: Negative for dysuria. No vaginal bleeding. Positive increase in  vaginal discharge. Musculoskeletal: Negative for back pain. Skin: Negative for rash. Neurological: Negative for headaches. No focal numbness, tingling or weakness.   10-point ROS otherwise negative.  ____________________________________________   PHYSICAL EXAM:  VITAL SIGNS: ED Triage Vitals  Enc Vitals Group     BP 10/29/16 1642 (!) 152/85     Pulse Rate 10/29/16 1642 98     Resp 10/29/16 1641 15     Temp 10/29/16 1641 98.8 F (37.1 C)     Temp Source 10/29/16 1641 Oral     SpO2 10/29/16 1642 100 %     Weight 10/29/16 1642 160 lb (72.6 kg)     Height 10/29/16 1642 5\' 4"  (1.626 m)     Head Circumference --      Peak Flow --      Pain Score 10/29/16 1642 4     Pain Loc --      Pain Edu? --      Excl. in GC? --     Constitutional: Alert and oriented. Well appearing and in no acute distress. Answers questions appropriately. Eyes: Conjunctivae are normal.  EOMI. No scleral icterus. Head: Atraumatic. Nose: No congestion/rhinnorhea. Mouth/Throat: Mucous membranes are Dry.  Neck: No stridor.  Supple.   Cardiovascular: Normal rate, regular rhythm. No murmurs, rubs or gallops.  Respiratory: Normal respiratory effort.  No accessory muscle use or retractions. Lungs CTAB.  No wheezes, rales or ronchi. Gastrointestinal: Soft and nondistended.  To palpation in the epigastrium greater than the right upper quadrant. Negative Murphy sign. No guarding or rebound.  No peritoneal signs. Genitourinary: Normal-appearing external genitalia without lesions. Normal vaginal exam with physiologic discharge, normal-appearing cervix, normal vaginal wall tissue. Bimanual exam is negative for  CMT, adnexal tenderness to palpation, no palpable masses. Musculoskeletal: No LE edema.  Neurologic:  A&Ox3.  Speech is clear.  Face and smile are symmetric.  EOMI.  Moves all extremities well. Skin:  Skin is warm, dry and intact. No rash noted. Psychiatric: Mood and affect are normal. Speech and behavior are  normal.  Normal judgement.  ____________________________________________   LABS (all labs ordered are listed, but only abnormal results are displayed)  Labs Reviewed  WET PREP, GENITAL - Abnormal; Notable for the following:       Result Value   WBC, Wet Prep HPF POC RARE (*)    All other components within normal limits  CBC WITH DIFFERENTIAL/PLATELET - Abnormal; Notable for the following:    Neutro Abs 7.9 (*)    All other components within normal limits  BASIC METABOLIC PANEL - Abnormal; Notable for the following:    Sodium 131 (*)    Potassium 3.1 (*)    CO2 21 (*)    Glucose, Bld 103 (*)    BUN <5 (*)    All other components within normal limits  HCG, QUANTITATIVE, PREGNANCY - Abnormal; Notable for the following:    hCG, Beta Chain, Quant, S 136,299 (*)    All other components within normal limits  CHLAMYDIA/NGC RT PCR ([facility] ONLY)  INFLUENZA PANEL BY PCR (TYPE A & B, H1N1)  URINALYSIS COMPLETEWITH MICROSCOPIC ([facility] ONLY)   ____________________________________________  EKG  Not indicated ____________________________________________  RADIOLOGY  US Ob Comp Less 14 Wks  Result Date: 10/29/2016 CLINICAL DATA:  General abdominal pain with cramping, nausea and vomiting EXAM: OBSTETRIC <14 WK [place] AND TRANSVAGINAL OB US TECHNIQUE: Both transabdominal and transvaginal ultrasound examinations were performed for complete evaluation of the gestation as well as the maternal uterus, adnexal regions, and pelvic cul-de-sac. Transvaginal technique was performed to assess early pregnancy. COMPARISON:  None. FINDINGS: Intrauterine gestational sac: Single gestational sac identified. Yolk sac:  Visualized Embryo:  Visualized Cardiac Activity: Visualized Heart Rate: 175  bpm CRL:  2.0 cm 8 w   4 d                  [place] EDC: 06/06/2017 Subchorionic hemorrhage: Small to moderate subchorionic hemorrhage along the inferior portion of the gestational sac. Maternal uterus/adnexae: Uterus  otherwise unremarkable. The uterus measures 5.4 by 8.6 x 6.8 cm. The bilateral ovaries are unremarkable. The right ovary measures 2.8 x 1.5 by 2.2 cm. The left ovary measures 3.6 x 2.1 by 3.3 cm. There is a probable corpus luteal cyst in the left ovary measuring 2.6 cm. Trace amount of free fluid in the cul-de-sac. IMPRESSION: 1. Single intrauterine gestation with fetal cardiac activity. 2. Small to moderate subchorionic hemorrhage along the inferior portion of the gestational sac. 3. Probable left ovarian corpus luteal cyst 4. Trace amount of free fluid in the cul-de-sac Electronically Signed   By: Jasmine Pang M.D.   On: 10/29/2016 19:05   US Ob Transvaginal  Result Date: 10/29/2016 CLINICAL DATA:  General abdominal pain with cramping, nausea and vomiting EXAM: OBSTETRIC <14 WK [place] AND TRANSVAGINAL OB US TECHNIQUE: Both transabdominal and transvaginal ultrasound examinations were performed for complete evaluation of the gestation as well as the maternal uterus, adnexal regions, and pelvic cul-de-sac. Transvaginal technique was performed to assess early pregnancy. COMPARISON:  None. FINDINGS: Intrauterine gestational sac: Single gestational sac identified. Yolk sac:  Visualized Embryo:  Visualized Cardiac Activity: Visualized Heart Rate: 175  bpm CRL:  2.0 cm 8 w  4 d                  [place] EDC: 06/06/2017 Subchorionic hemorrhage: Small to moderate subchorionic hemorrhage along the inferior portion of the gestational sac. Maternal uterus/adnexae: Uterus otherwise unremarkable. The uterus measures 5.4 by 8.6 x 6.8 cm. The bilateral ovaries are unremarkable. The right ovary measures 2.8 x 1.5 by 2.2 cm. The left ovary measures 3.6 x 2.1 by 3.3 cm. There is a probable corpus luteal cyst in the left ovary measuring 2.6 cm. Trace amount of free fluid in the cul-de-sac. IMPRESSION: 1. Single intrauterine gestation with fetal cardiac activity. 2. Small to moderate subchorionic hemorrhage along the inferior portion of  the gestational sac. 3. Probable left ovarian corpus luteal cyst 4. Trace amount of free fluid in the cul-de-sac Electronically Signed   By: Jasmine Pang M.D.   On: 10/29/2016 19:05   US Abdomen Limited Ruq  Result Date: 10/29/2016 CLINICAL DATA:  Right upper quadrant pain with nausea and vomiting EXAM: US ABDOMEN LIMITED - RIGHT UPPER QUADRANT COMPARISON:  None. FINDINGS: Gallbladder: No gallstones or wall thickening visualized. No sonographic Murphy sign noted by sonographer. Common bile duct: Diameter: 1.8 mm Liver: No focal lesion identified. Within normal limits in parenchymal echogenicity. IMPRESSION: Negative right upper quadrant abdominal ultrasound Electronically Signed   By: Jasmine Pang M.D.   On: 10/29/2016 22:14    ____________________________________________   PROCEDURES  Procedure(s) performed: None  Procedures  Critical Care performed: No ____________________________________________   INITIAL IMPRESSION / ASSESSMENT AND PLAN / ED COURSE  Pertinent labs & imaging results that were available during my care of the patient were reviewed by me and considered in my medical decision making (see chart for details).  25 y.o. G2P1 approximately [redacted] weeks pregnant presenting with multiple weeks of nausea and vomiting, worse over the last several days, now with epigastric pain and multiple episodes of diarrhea. Overall, the patient is nontoxic but does have dry mucous membranes and lab studies showing hyponatremia and hypokalemia which are consistent with her nausea vomiting and diarrhea. Ultrasound from triage shows a normal IUP with good fetal heart tones. No plan to evaluate the patient for a vaginal infection, and treat her epigastric pain which may be from recurrent nausea and vomiting, irritation of the gastric lining. The patient does have some mild pain in the right upper quadrant, so I'll also get an ultrasound to evaluate for gallbladder disease. Plan to reevaluate the patient  for final disposition.  ----------------------------------------- 10:34 PM on 10/29/2016 -----------------------------------------  The patient's workup in the emergency department is consistent with hyperemesis gravidarum, epigastric pain, and diarrhea. The patient's pelvic ultrasound shows a intrauterine pregnancy with normal fetal heart tones, and her right upper quadrant AND does not show any gallbladder disease. She is mildly hyponatremic and hypokalemic, which she will have rechecked with her OB/GYN. She does not have any evidence of infection in her vagina.  We are currently doing a by mouth challenge with liquids, and if she is able to tolerate liquid, plan to discharge her home. She will call the [facility] office tomorrow morning for follow-up appointment. She and her husband understand return precautions as well as follow-up instructions.   ____________________________________________  FINAL CLINICAL IMPRESSION(S) / ED DIAGNOSES  Final diagnoses:  Right upper quadrant pain  Hyperemesis gravidarum  Diarrhea, unspecified type  Epigastric abdominal pain  Hyponatremia  Hypokalemia    Clinical Course      NEW MEDICATIONS STARTED DURING THIS VISIT:  New Prescriptions  ONDANSETRON (ZOFRAN ODT) 4 MG DISINTEGRATING TABLET    Take 1 tablet (4 mg total) by mouth every 8 (eight) hours as needed for nausea or vomiting.   PROMETHAZINE (PHENERGAN) 25 MG SUPPOSITORY    Place 1 suppository (25 mg total) rectally every 6 (six) hours as needed for nausea.      Rockne MenghiniAnne-Caroline Merelyn Klump, MD 10/29/16 [id number]    Rockne MenghiniAnne-Caroline Mariadel Mruk, MD 10/29/16 [id number]

## 2016-10-29 NOTE — Discharge Instructions (Addendum)
Please take a clear liquid diet for the next 24 hours, then advance to a bland BRAT diet as tolerated. You may take Zofran or Phenergan for nausea and vomiting.  Please make an appointment to establish an OB/GYN; please continue your prenatal vitamins. Please have your doctor follow up the results of your urine test today.  Return to the emergency department if you develop lightheadedness or fainting, fever, severe pain, vaginal bleeding, inability to keep down fluids, or any other symptoms concerning to you.

## 2017-03-27 ENCOUNTER — Encounter: Payer: Self-pay | Admitting: *Deleted

## 2017-03-27 ENCOUNTER — Observation Stay
Admission: EM | Admit: 2017-03-27 | Discharge: 2017-03-27 | Disposition: A | Discharge status: Home / Self Care | Payer: Medicaid Other | Attending: Obstetrics and Gynecology | Admitting: Obstetrics and Gynecology

## 2017-03-27 DIAGNOSIS — O26899 Other specified pregnancy related conditions, unspecified trimester: Secondary | ICD-10-CM | POA: Diagnosis present

## 2017-03-27 DIAGNOSIS — Z3483 Encounter for supervision of other normal pregnancy, third trimester: Principal | ICD-10-CM | POA: Insufficient documentation

## 2017-03-27 DIAGNOSIS — R102 Pelvic and perineal pain: Secondary | ICD-10-CM | POA: Diagnosis not present

## 2017-03-27 NOTE — Progress Notes (Signed)
Patient ID: Carolyn Bennett, female   DOB: 03/14/1991, 26 y.o.   MRN: [id number] Carolyn Bennett 10/25/1991 G2 P1 5072w3d presents for pelvic pressure  noLOF , no vaginal bleeding , O;BP 132/66 (BP Location: Left Arm)   Pulse 94   Temp 98 F (36.7 C) (Oral)   Resp 18   LMP 08/19/2016 (Approximate)  ABDsoft NT  CX closed/ 50% NSTreactive NST, no ctx . Mild uterine irritability  Labs: ua neg A: pelvic pressure , reassuring fetal monitoring . No evidence of PTL  P:d/c home with precautions

## 2017-03-27 NOTE — Discharge Summary (Signed)
Pt d/c'd to home in stable condition. Given preterm labor precautions, verbalized understanding. Follow up care reviewed.

## 2017-03-27 NOTE — OB Triage Note (Signed)
G2P1 [facility] pressure which began Monday and has stayed consistent. Provider sent her over to evaluate contractions and monitor baby. No LOF, VB, +FM.

## 2017-03-27 NOTE — Discharge Summary (Signed)
  Suzy Bouchardhomas J Khayman Kirsch, MD  Obstetrics    [] Hide copied text [] Hover for attribution information Patient ID: Teresa CoombsCrystal R Holleman, female   DOB: 09/23/1991, 26 y.o.   MRN: [id number] Paylin R Holleman 12/13/1991 G2 P1 4458w3d presents for pelvic pressure  noLOF , no vaginal bleeding , O;BP 132/66 (BP Location: Left Arm)   Pulse 94   Temp 98 F (36.7 C) (Oral)   Resp 18   LMP 08/19/2016 (Approximate)  ABDsoft NT  CX closed/ 50% NSTreactive NST, no ctx . Mild uterine irritability  Labs: ua neg A: pelvic pressure , reassuring fetal monitoring . No evidence of PTL  P:d/c home with precautions      Electronically signed by Suzy Bouchardhomas J Bernardine Langworthy, MD at 03/27/2017 12:02 PM

## 2017-04-29 ENCOUNTER — Observation Stay
Admission: EM | Admit: 2017-04-29 | Discharge: 2017-04-29 | Disposition: A | Discharge status: Home / Self Care | Payer: Medicaid Other | Attending: Obstetrics and Gynecology | Admitting: Obstetrics and Gynecology

## 2017-04-29 DIAGNOSIS — O26893 Other specified pregnancy related conditions, third trimester: Secondary | ICD-10-CM | POA: Diagnosis not present

## 2017-04-29 DIAGNOSIS — R51 Headache: Secondary | ICD-10-CM | POA: Diagnosis not present

## 2017-04-29 DIAGNOSIS — R600 Localized edema: Secondary | ICD-10-CM | POA: Diagnosis not present

## 2017-04-29 DIAGNOSIS — Z3A35 35 weeks gestation of pregnancy: Secondary | ICD-10-CM | POA: Diagnosis not present

## 2017-04-29 DIAGNOSIS — M545 Low back pain: Secondary | ICD-10-CM | POA: Diagnosis not present

## 2017-04-29 LAB — COMPREHENSIVE METABOLIC PANEL
ALT: 12 U/L — ABNORMAL LOW (ref 14–54)
AST: 18 U/L (ref 15–41)
Albumin: 2.8 g/dL — ABNORMAL LOW (ref 3.5–5.0)
Alkaline Phosphatase: 108 U/L (ref 38–126)
Anion gap: 12 (ref 5–15)
BILIRUBIN TOTAL: 0.6 mg/dL (ref 0.3–1.2)
CO2: 19 mmol/L — ABNORMAL LOW (ref 22–32)
CREATININE: 0.39 mg/dL — AB (ref 0.44–1.00)
Calcium: 8.9 mg/dL (ref 8.9–10.3)
Chloride: 113 mmol/L — ABNORMAL HIGH (ref 101–111)
GFR calc Af Amer: >60 mL/min (ref >60)
Glucose, Bld: 81 mg/dL (ref 65–99)
POTASSIUM: 3.3 mmol/L — AB (ref 3.5–5.1)
Sodium: 144 mmol/L (ref 135–145)
TOTAL PROTEIN: 6.3 g/dL — AB (ref 6.5–8.1)

## 2017-04-29 LAB — CBC
HEMATOCRIT: 35.9 % (ref 35.0–47.0)
Hemoglobin: 12 g/dL (ref 12.0–16.0)
MCH: 28.3 pg (ref 26.0–34.0)
MCHC: 33.4 g/dL (ref 32.0–36.0)
MCV: 84.9 fL (ref 80.0–100.0)
PLATELETS: 284 10*3/uL (ref 150–440)
RBC: 4.23 MIL/uL (ref 3.80–5.20)
RDW: 14.3 % (ref 11.5–14.5)
WBC: 10.7 10*3/uL (ref 3.6–11.0)

## 2017-04-29 LAB — PROTEIN / CREATININE RATIO, URINE
Creatinine, Urine: 101 mg/dL
Protein Creatinine Ratio: 0.2 mg/mg{Cre} — ABNORMAL HIGH (ref 0.00–0.15)
TOTAL PROTEIN, URINE: 20 mg/dL

## 2017-04-29 MED ORDER — ACETAMINOPHEN 325 MG PO TABS
325.0000 mg | ORAL_TABLET | Freq: Four times a day (QID) | ORAL | Status: DC | PRN
  Administered 2017-04-29: 325 mg via ORAL

## 2017-04-29 MED ORDER — ACETAMINOPHEN 325 MG PO TABS
ORAL_TABLET | ORAL | Status: AC
  Filled 2017-04-29: qty 1

## 2017-04-29 NOTE — OB Triage Provider Note (Addendum)
TRIAGE VISIT with NST   Melaine R Holleman is a 26 y.o. G2P1001. She is at [redacted]w[redacted]d gestation.  Indication: headache and lower extr swelling, low back pain, wrist pain in right hand.  S: Resting comfortably. no CTX, no VB. Active fetal movement. Denies  SOB, RUQ pain, visual changes. O:  BP 123/68   Pulse 94   Temp 98.2 F (36.8 C) (Oral)   Resp 18   Ht  (1.6 m)   Wt 203 lb (92.1 kg)   LMP 08/19/2016 (Approximate)   BMI 35.96 kg/m  Results for orders placed or performed during the hospital encounter of 04/29/17 (from the past 48 hour(s))  Comprehensive metabolic panel   Collection Time: 04/29/17  4:06 PM  Result Value Ref Range   Sodium 144 135 - 145 mmol/L   Potassium 3.3 (L) 3.5 - 5.1 mmol/L   Chloride 113 (H) 101 - 111 mmol/L   CO2 19 (L) 22 - 32 mmol/L   Glucose, Bld 81 65 - 99 mg/dL   BUN <5 (L) 6 - 20 mg/dL   Creatinine, Ser 1.61 (L) 0.44 - 1.00 mg/dL   Calcium 8.9 8.9 - 09.6 mg/dL   Total Protein 6.3 (L) 6.5 - 8.1 g/dL   Albumin 2.8 (L) 3.5 - 5.0 g/dL   AST 18 15 - 41 U/L   ALT 12 (L) 14 - 54 U/L   Alkaline Phosphatase 108 38 - 126 U/L   Total Bilirubin 0.6 0.3 - 1.2 mg/dL   GFR calc non Af Amer >60 >60 mL/min   GFR calc Af Amer >60 >60 mL/min   Anion gap 12 5 - 15  CBC   Collection Time: 04/29/17  4:06 PM  Result Value Ref Range   WBC 10.7 3.6 - 11.0 K/uL   RBC 4.23 3.80 - 5.20 MIL/uL   Hemoglobin 12.0 12.0 - 16.0 g/dL   HCT 04.5 40.9 - 81.1 %   MCV 84.9 80.0 - 100.0 fL   MCH 28.3 26.0 - 34.0 pg   MCHC 33.4 32.0 - 36.0 g/dL   RDW 91.4 78.2 - 95.6 %   Platelets 284 150 - 440 K/uL  Protein / creatinine ratio, urine   Collection Time: 04/29/17  5:47 PM  Result Value Ref Range   Creatinine, Urine 101 mg/dL   Total Protein, Urine 20 mg/dL   Protein Creatinine Ratio 0.20 (H) 0.00 - 0.15 mg/mg[Cre]     Gen: NAD, AAOx3      Abd: FNTTP      Ext: Non-tender, Nonedmeatous   FHT: 140, mod var, +accels, no decels TOCO: quiet SVE:   deferred  NST:  Category I strip, see detailed evaluation above  A/P:  26 y.o. G2P1001 [redacted]w[redacted]d with headache and swelling, new hand numbness and pain.                        Labor: not present.   R/o PreEclampsia: labs normal and BP negative  Likely dx carpal tunnel; behavioral modifications recommended  Discomforts of pregnancy: behavioral modifications recommended  Fetal Wellbeing: Reassuring Cat 1 tracing.  D/c home stable, precautions reviewed, follow-up as scheduled.

## 2017-04-29 NOTE — OB Triage Note (Signed)
Pt G2P1 [redacted]w[redacted]d complains of swelling in both feet for two days. Pt states headache nd rates pain 8/10. Pt states Tylenol has helped with previous headaches, but she ran out of Tylenol. Pt states her right hand is hurting/numb. Pt states pain 10/10. Pt states it started 3 days ago and the pain is keeping her up at night. Pt states nothing relieves pain in hand. Pt denies ctx, leaking of fluid, and states + FM. VSS. Monitors applied and assessing.

## 2017-05-02 NOTE — Discharge Summary (Signed)
TRIAGE VISIT with NST   Carolyn Bennett is a 26 y.o. G2P1001. She is at [redacted]w[redacted]d gestation.  Indication: headache and lower extr swelling, low back pain, wrist pain in right hand.  S: Resting comfortably. no CTX, no VB. Active fetal movement. Denies  SOB, RUQ pain, visual changes. O:  BP 123/68   Pulse 94   Temp 98.2 F (36.8 C) (Oral)   Resp 18   Ht 5' 3" (1.6 m)   Wt 203 lb (92.1 kg)   LMP 08/19/2016 (Approximate)   BMI 35.96 kg/m  Results for orders placed or performed during the hospital encounter of 04/29/17 (from the past 48 hour(s))  Comprehensive metabolic panel   Collection Time: 04/29/17  4:06 PM  Result Value Ref Range   Sodium 144 135 - 145 mmol/L   Potassium 3.3 (L) 3.5 - 5.1 mmol/L   Chloride 113 (H) 101 - 111 mmol/L   CO2 19 (L) 22 - 32 mmol/L   Glucose, Bld 81 65 - 99 mg/dL   BUN <5 (L) 6 - 20 mg/dL   Creatinine, Ser 0.39 (L) 0.44 - 1.00 mg/dL   Calcium 8.9 8.9 - 10.3 mg/dL   Total Protein 6.3 (L) 6.5 - 8.1 g/dL   Albumin 2.8 (L) 3.5 - 5.0 g/dL   AST 18 15 - 41 U/L   ALT 12 (L) 14 - 54 U/L   Alkaline Phosphatase 108 38 - 126 U/L   Total Bilirubin 0.6 0.3 - 1.2 mg/dL   GFR calc non Af Amer >60 >60 mL/min   GFR calc Af Amer >60 >60 mL/min   Anion gap 12 5 - 15  CBC   Collection Time: 04/29/17  4:06 PM  Result Value Ref Range   WBC 10.7 3.6 - 11.0 K/uL   RBC 4.23 3.80 - 5.20 MIL/uL   Hemoglobin 12.0 12.0 - 16.0 g/dL   HCT 35.9 35.0 - 47.0 %   MCV 84.9 80.0 - 100.0 fL   MCH 28.3 26.0 - 34.0 pg   MCHC 33.4 32.0 - 36.0 g/dL   RDW 14.3 11.5 - 14.5 %   Platelets 284 150 - 440 K/uL  Protein / creatinine ratio, urine   Collection Time: 04/29/17  5:47 PM  Result Value Ref Range   Creatinine, Urine 101 mg/dL   Total Protein, Urine 20 mg/dL   Protein Creatinine Ratio 0.20 (H) 0.00 - 0.15 mg/mg[Cre]     Gen: NAD, AAOx3      Abd: FNTTP      Ext: Non-tender, Nonedmeatous   FHT: 140, mod var, +accels, no decels TOCO: quiet SVE:   deferred  NST:  Category I strip, see detailed evaluation above  A/P:  26 y.o. G2P1001 [redacted]w[redacted]d with headache and swelling, new hand numbness and pain.                        Labor: not present.   R/o PreEclampsia: labs normal and BP negative  Likely dx carpal tunnel; behavioral modifications recommended  Discomforts of pregnancy: behavioral modifications recommended  Fetal Wellbeing: Reassuring Cat 1 tracing.  D/c home stable, precautions reviewed, follow-up as scheduled.       labs normal and BP negative  Likely dx carpal tunnel; behavioral modifications recommended  Discomforts of pregnancy: behavioral modifications recommended  Fetal Wellbeing: Reassuring Cat 1 tracing.  D/c home stable, precautions reviewed, follow-up as scheduled.

## 2017-05-08 LAB — OB RESULTS CONSOLE GC/CHLAMYDIA
Chlamydia: NEGATIVE
GC PROBE AMP, GENITAL: NEGATIVE

## 2017-05-08 LAB — OB RESULTS CONSOLE GBS: STREP GROUP B AG: NEGATIVE

## 2017-05-20 ENCOUNTER — Inpatient Hospital Stay
Admission: EM | Admit: 2017-05-20 | Discharge: 2017-05-25 | DRG: 765 | Disposition: A | Discharge status: Home / Self Care | Payer: Medicaid Other | Attending: Obstetrics & Gynecology | Admitting: Obstetrics & Gynecology

## 2017-05-20 DIAGNOSIS — O9081 Anemia of the puerperium: Secondary | ICD-10-CM | POA: Diagnosis not present

## 2017-05-20 DIAGNOSIS — O1494 Unspecified pre-eclampsia, complicating childbirth: Secondary | ICD-10-CM | POA: Diagnosis present

## 2017-05-20 DIAGNOSIS — O99214 Obesity complicating childbirth: Secondary | ICD-10-CM | POA: Diagnosis present

## 2017-05-20 DIAGNOSIS — E669 Obesity, unspecified: Secondary | ICD-10-CM | POA: Diagnosis present

## 2017-05-20 DIAGNOSIS — O1404 Mild to moderate pre-eclampsia, complicating childbirth: Principal | ICD-10-CM | POA: Diagnosis present

## 2017-05-20 DIAGNOSIS — Z3A38 38 weeks gestation of pregnancy: Secondary | ICD-10-CM | POA: Diagnosis not present

## 2017-05-20 DIAGNOSIS — Z6832 Body mass index (BMI) 32.0-32.9, adult: Secondary | ICD-10-CM | POA: Diagnosis not present

## 2017-05-20 DIAGNOSIS — O358XX0 Maternal care for other (suspected) fetal abnormality and damage, not applicable or unspecified: Secondary | ICD-10-CM | POA: Diagnosis present

## 2017-05-20 DIAGNOSIS — O1490 Unspecified pre-eclampsia, unspecified trimester: Secondary | ICD-10-CM | POA: Diagnosis present

## 2017-05-20 DIAGNOSIS — Z6837 Body mass index (BMI) 37.0-37.9, adult: Secondary | ICD-10-CM

## 2017-05-20 LAB — TYPE AND SCREEN
ABO/RH(D): O POS
Antibody Screen: NEGATIVE

## 2017-05-20 LAB — CBC
HEMATOCRIT: 36.5 % (ref 35.0–47.0)
Hemoglobin: 12.4 g/dL (ref 12.0–16.0)
MCH: 29.1 pg (ref 26.0–34.0)
MCHC: 34.1 g/dL (ref 32.0–36.0)
MCV: 85.3 fL (ref 80.0–100.0)
Platelets: 276 10*3/uL (ref 150–440)
RBC: 4.28 MIL/uL (ref 3.80–5.20)
RDW: 15.3 % — AB (ref 11.5–14.5)
WBC: 11.2 10*3/uL — AB (ref 3.6–11.0)

## 2017-05-20 LAB — PROTEIN / CREATININE RATIO, URINE
Creatinine, Urine: 24 mg/dL
Protein Creatinine Ratio: 0.42 mg/mg{Cre} — ABNORMAL HIGH (ref 0.00–0.15)
Total Protein, Urine: 10 mg/dL

## 2017-05-20 MED ORDER — ACETAMINOPHEN 325 MG PO TABS: 650.0000 mg | ORAL_TABLET | ORAL | Status: DC | PRN

## 2017-05-20 MED ORDER — SOD CITRATE-CITRIC ACID 500-334 MG/5ML PO SOLN: 30.0000 mL | ORAL | Status: DC | PRN

## 2017-05-20 MED ORDER — AMMONIA AROMATIC IN INHA
RESPIRATORY_TRACT | Status: AC
  Filled 2017-05-20: qty 10

## 2017-05-20 MED ORDER — MISOPROSTOL 25 MCG QUARTER TABLET
25.0000 ug | ORAL_TABLET | Freq: Once | ORAL | Status: AC
  Administered 2017-05-20: 25 ug via VAGINAL

## 2017-05-20 MED ORDER — LACTATED RINGERS IV SOLN
INTRAVENOUS | Status: DC
  Administered 2017-05-20 – 2017-05-21 (×3): via INTRAVENOUS

## 2017-05-20 MED ORDER — MISOPROSTOL 200 MCG PO TABS
ORAL_TABLET | ORAL | Status: AC
  Filled 2017-05-20: qty 4

## 2017-05-20 MED ORDER — METOCLOPRAMIDE HCL 5 MG/ML IJ SOLN
10.0000 mg | Freq: Four times a day (QID) | INTRAMUSCULAR | Status: DC | PRN
  Administered 2017-05-21 (×2): 10 mg via INTRAVENOUS
  Filled 2017-05-20 (×2): qty 2

## 2017-05-20 MED ORDER — FAMOTIDINE IN NACL 20-0.9 MG/50ML-% IV SOLN: 20.0000 mg | Freq: Two times a day (BID) | INTRAVENOUS | Status: DC

## 2017-05-20 MED ORDER — OXYTOCIN 40 UNITS IN LACTATED RINGERS INFUSION - SIMPLE MED
2.5000 [IU]/h | INTRAVENOUS | Status: DC
  Filled 2017-05-20: qty 1000

## 2017-05-20 MED ORDER — MAGNESIUM SULFATE BOLUS VIA INFUSION
4.0000 g | Freq: Once | INTRAVENOUS | Status: DC
  Filled 2017-05-20: qty 500

## 2017-05-20 MED ORDER — LACTATED RINGERS IV SOLN: 500.0000 mL | INTRAVENOUS | Status: DC | PRN

## 2017-05-20 MED ORDER — ONDANSETRON HCL 4 MG/2ML IJ SOLN
4.0000 mg | Freq: Four times a day (QID) | INTRAMUSCULAR | Status: DC | PRN
  Administered 2017-05-21 – 2017-05-22 (×2): 4 mg via INTRAVENOUS
  Filled 2017-05-20 (×2): qty 2

## 2017-05-20 MED ORDER — OXYTOCIN BOLUS FROM INFUSION: 500.0000 mL | Freq: Once | INTRAVENOUS | Status: DC

## 2017-05-20 MED ORDER — HYDRALAZINE HCL 20 MG/ML IJ SOLN
10.0000 mg | Freq: Once | INTRAMUSCULAR | Status: DC | PRN
  Filled 2017-05-20: qty 0.5

## 2017-05-20 MED ORDER — MAGNESIUM SULFATE 50 % IJ SOLN: 2.0000 g/h | INTRAVENOUS | Status: DC

## 2017-05-20 MED ORDER — FAMOTIDINE 20 MG PO TABS
20.0000 mg | ORAL_TABLET | Freq: Two times a day (BID) | ORAL | Status: DC
  Administered 2017-05-20 – 2017-05-21 (×2): 20 mg via ORAL
  Filled 2017-05-20: qty 1

## 2017-05-20 MED ORDER — BUTORPHANOL TARTRATE 2 MG/ML IJ SOLN
1.0000 mg | INTRAMUSCULAR | Status: DC | PRN
  Administered 2017-05-20: 1 mg via INTRAVENOUS
  Filled 2017-05-20: qty 2

## 2017-05-20 MED ORDER — TERBUTALINE SULFATE 1 MG/ML IJ SOLN
0.2500 mg | Freq: Once | INTRAMUSCULAR | Status: DC | PRN
  Filled 2017-05-20: qty 1

## 2017-05-20 MED ORDER — FAMOTIDINE 20 MG PO TABS
ORAL_TABLET | ORAL | Status: AC
  Administered 2017-05-20: 20 mg via ORAL
  Filled 2017-05-20: qty 1

## 2017-05-20 MED ORDER — LIDOCAINE HCL (PF) 1 % IJ SOLN
INTRAMUSCULAR | Status: AC
  Filled 2017-05-20: qty 30

## 2017-05-20 MED ORDER — MISOPROSTOL 25 MCG QUARTER TABLET
25.0000 ug | ORAL_TABLET | ORAL | Status: DC | PRN
  Administered 2017-05-20 – 2017-05-21 (×3): 25 ug via ORAL
  Filled 2017-05-20: qty 2
  Filled 2017-05-20 (×2): qty 1
  Filled 2017-05-20: qty 2

## 2017-05-20 MED ORDER — OXYTOCIN 40 UNITS IN LACTATED RINGERS INFUSION - SIMPLE MED
1.0000 m[IU]/min | INTRAVENOUS | Status: DC
  Administered 2017-05-21: 2 m[IU]/min via INTRAVENOUS

## 2017-05-20 MED ORDER — ACETAMINOPHEN 500 MG PO TABS
1000.0000 mg | ORAL_TABLET | Freq: Four times a day (QID) | ORAL | Status: DC | PRN
  Administered 2017-05-21: 1000 mg via ORAL
  Filled 2017-05-20 (×3): qty 2

## 2017-05-20 MED ORDER — SODIUM CHLORIDE FLUSH 0.9 % IV SOLN
INTRAVENOUS | Status: AC
  Filled 2017-05-20: qty 10

## 2017-05-20 MED ORDER — LIDOCAINE HCL (PF) 1 % IJ SOLN
30.0000 mL | INTRAMUSCULAR | Status: AC | PRN
  Administered 2017-05-21: 4 mL via SUBCUTANEOUS

## 2017-05-20 MED ORDER — LABETALOL HCL 5 MG/ML IV SOLN
20.0000 mg | INTRAVENOUS | Status: DC | PRN
  Filled 2017-05-20: qty 16

## 2017-05-20 NOTE — H&P (Signed)
OB History & Physical   History of Present Illness:  Chief Complaint:   HPI:  Carolyn Bennett is a 26 y.o. G2P1001 female at 11w1ddated by LMP of 08/26/16 consistent with 19wk [place] 06/01/17.  She presents to L&D from the office after being diagnosed with preeclampsia.  +FM, no CTX, no LOF, no VB Denies: visual changes, SOB, or RUQ/epigastric pain Admits to headache which DOES respond to tylenol, has one currently last tylenol dose was at 8am.  Pregnancy Issues: 1. Preeclampsia at term 2. Obesity BMI 32 3. Fetal Pericardial effusion s/p workup and fetal echo with negative findings by MFM. 4. Resolved low-lying placenta  Maternal Medical History:  History reviewed. No pertinent past medical history.  denies  History reviewed. No pertinent surgical history.  Wisdom Teeth  No Known Allergies  Prior to Admission medications   Medication Sig Start Date End Date Taking? Authorizing Provider  acetaminophen (TYLENOL) 500 MG tablet Take 500 mg by mouth every 6 (six) hours as needed.    [provider]  ibuprofen (ADVIL,MOTRIN) 800 MG tablet Take 1 tablet (800 mg total) by mouth 3 (three) times daily. Patient not taking: Reported on 04/29/2017 08/11/15   WMarella Chimes PA-C  methocarbamol (ROBAXIN) 500 MG tablet Take 1 tablet (500 mg total) by mouth 2 (two) times daily as needed for muscle spasms. Patient not taking: Reported on 04/29/2017 08/11/15   WMarella Chimes PA-C  ondansetron (ZOFRAN ODT) 4 MG disintegrating tablet Take 1 tablet (4 mg total) by mouth every 8 (eight) hours as needed for nausea or vomiting. Patient not taking: Reported on 04/29/2017 10/29/16   NEula Listen MD  oxyCODONE-acetaminophen (PERCOCET/ROXICET) 5-325 MG per tablet Take 2 tablets by mouth every 4 (four) hours as needed for severe pain. Patient not taking: Reported on 04/29/2017 08/11/15   WMarella Chimes PA-C  Prenatal Vit-Fe Fumarate-FA (MULTIVITAMIN-PRENATAL) 27-0.8  MG TABS tablet Take 1 tablet by mouth daily at 12 noon.    [provider]  promethazine (PHENERGAN) 25 MG suppository Place 1 suppository (25 mg total) rectally every 6 (six) hours as needed for nausea. Patient not taking: Reported on 04/29/2017 10/29/16 10/29/17  NEula Listen MD     Prenatal care site: [facility]   Social History: She  reports that she has never smoked. She has never used smokeless tobacco. She reports that she does not drink alcohol or use drugs.  Family History: family history includes Cancer in her father; Diabetes in her father. uncle melantoma  Review of Systems: A full review of systems was performed and negative except as noted in the HPI.     Physical Exam:  Vital Signs: BP 128/80   Pulse (!) 103   Temp 98.3 F (36.8 C) (Oral)   Resp 18   Ht 5' 3"  (1.6 m)   Wt 95.3 kg (210 lb)   LMP 08/19/2016 (Approximate)   BMI 37.20 kg/m  General: no acute distress.  HEENT: normocephalic, atraumatic Heart: regular rate & rhythm.  No murmurs/rubs/gallops Lungs: clear to auscultation bilaterally, normal respiratory effort Abdomen: soft, gravid, non-tender;  EFW: 7.4 Pelvic:   External: Normal external female genitalia  Cervix: Dilation: 3 / Effacement (%): 40 / Station: -3    Extremities: non-tender, symmetric, 1+ edema bilaterally.  DTRs: 2+ Neurologic: Alert & oriented x 3.    Results for orders placed or performed during the hospital encounter of 05/20/17 (from the past 24 hour(s))  CBC  Status: Abnormal   Collection Time: 05/20/17  7:06 PM  Result Value Ref Range   WBC 11.2 (H) 3.6 - 11.0 K/uL   RBC 4.28 3.80 - 5.20 MIL/uL   Hemoglobin 12.4 12.0 - 16.0 g/dL   HCT 36.5 35.0 - 47.0 %   MCV 85.3 80.0 - 100.0 fL   MCH 29.1 26.0 - 34.0 pg   MCHC 34.1 32.0 - 36.0 g/dL   RDW 15.3 (H) 11.5 - 14.5 %   Platelets 276 150 - 440 K/uL  Type and screen Luke     Status: None (Preliminary  result)   Collection Time: 05/20/17  7:06 PM  Result Value Ref Range   ABO/RH(D) PENDING    Antibody Screen PENDING    Sample Expiration 05/23/2017     Pertinent Results:  Prenatal Labs: Blood type/Rh O+  Antibody screen neg  Rubella Immune  Varicella Immune  RPR NR  HBsAg Neg  HIV NR  GC neg  Chlamydia neg  Genetic screening negative  1 hour GTT 102  3 hour GTT --  GBS negative   FHT:  160 mod no accels no decels TOCO: q2-84mn SVE:  Dilation: 3 / Effacement (%): 448/ Station: -3    Cephalic by leopolds    Assessment:  Carolyn Bennett is a 26y.o. G2P1001 female at 380w1dith newly diagnosed preeclapmsia.   Plan:  1. Admit to Labor & Delivery 2. CBC, T&S, Clrs, IVF 3. GBS  Negative - no ppx indicated 4. Consents obtained. 5. Continuous efm/toco 6. Will check cervix and assess for need for cytotec vs pitocin vs AROM.  7. Preeclampsia:  Met criteria with BP + >300 protein creatinine ratio.  Has mild pressures intermittently.  Will NOT start magnesium sulfate unless has persistent severe-range pressures OR develops symptoms.  Tylenol then PRN Reglan for HA.     ----- ChLarey DaysMD Attending Obstetrician and Gynecologist [facility]

## 2017-05-21 ENCOUNTER — Inpatient Hospital Stay: Payer: Medicaid Other | Admitting: Anesthesiology

## 2017-05-21 MED ORDER — SODIUM CHLORIDE 0.9 % IV SOLN
INTRAVENOUS | Status: DC | PRN
  Administered 2017-05-21 – 2017-05-22 (×7): 5 mL via EPIDURAL

## 2017-05-21 MED ORDER — MISOPROSTOL 25 MCG QUARTER TABLET
50.0000 ug | ORAL_TABLET | Freq: Once | ORAL | Status: AC
  Administered 2017-05-21: 50 ug via ORAL

## 2017-05-21 MED ORDER — FENTANYL 2.5 MCG/ML W/ROPIVACAINE 0.2% IN NS 100 ML EPIDURAL INFUSION (ARMC-ANES)
EPIDURAL | Status: AC
  Filled 2017-05-21: qty 100

## 2017-05-21 MED ORDER — BUPIVACAINE HCL (PF) 0.25 % IJ SOLN
INTRAMUSCULAR | Status: DC | PRN
  Administered 2017-05-21: 5 mL via EPIDURAL
  Administered 2017-05-21 (×2): 4 mL via EPIDURAL
  Administered 2017-05-22: 3 mL via EPIDURAL
  Administered 2017-05-22: 5 mL via EPIDURAL

## 2017-05-21 MED ORDER — LIDOCAINE-EPINEPHRINE (PF) 1.5 %-1:200000 IJ SOLN
INTRAMUSCULAR | Status: DC | PRN
  Administered 2017-05-21: 4 mL via EPIDURAL
  Administered 2017-05-21: 3 mL via PERINEURAL
  Administered 2017-05-21: 5 mL via EPIDURAL
  Administered 2017-05-22: 3 mL via PERINEURAL

## 2017-05-21 MED ORDER — LACTATED RINGERS IV SOLN: 500.0000 mL | Freq: Once | INTRAVENOUS | Status: DC

## 2017-05-21 MED ORDER — FENTANYL 2.5 MCG/ML W/ROPIVACAINE 0.2% IN NS 100 ML EPIDURAL INFUSION (ARMC-ANES)
10.0000 mL/h | EPIDURAL | Status: DC
  Filled 2017-05-21 (×3): qty 100

## 2017-05-21 MED ORDER — EPHEDRINE 5 MG/ML INJ: 10.0000 mg | INTRAVENOUS | Status: DC | PRN

## 2017-05-21 MED ORDER — LIDOCAINE HCL (PF) 2 % IJ SOLN
INTRAMUSCULAR | Status: DC | PRN
  Administered 2017-05-21: 3 mL
  Administered 2017-05-21: 8 mL via INTRADERMAL
  Administered 2017-05-21 – 2017-05-22 (×2): 1 mL via INTRADERMAL

## 2017-05-21 MED ORDER — DIPHENHYDRAMINE HCL 50 MG/ML IJ SOLN: 12.5000 mg | INTRAMUSCULAR | Status: DC | PRN

## 2017-05-21 MED ORDER — FENTANYL 2.5 MCG/ML W/ROPIVACAINE 0.2% IN NS 100 ML EPIDURAL INFUSION (ARMC-ANES)
EPIDURAL | Status: DC | PRN
Start: 2017-05-21 — End: 2017-05-22
  Administered 2017-05-21: 10 mL/h via EPIDURAL

## 2017-05-21 NOTE — Anesthesia Procedure Notes (Signed)
Epidural Patient location during procedure: OB Start time: 05/21/2017 8:20 PM End time: 05/21/2017 8:24 PM  Staffing Anesthesiologist: Margorie JohnPISCITELLO, Arjan Strohm K Performed: anesthesiologist   Preanesthetic Checklist Completed: patient identified, site marked, surgical consent, pre-op evaluation, timeout performed, IV checked, risks and benefits discussed and monitors and equipment checked  Epidural Patient position: sitting Prep: Betadine Patient monitoring: heart rate, continuous pulse ox and blood pressure Approach: midline Location: L3-L4 Injection technique: LOR saline  Needle:  Needle type: Tuohy  Needle gauge: 17 G Needle length: 9 cm and 9 Needle insertion depth: 6 cm Catheter type: closed end flexible Catheter size: 19 Gauge Catheter at skin depth: 11 cm Test dose: negative and 1.5% lidocaine with Epi 1:200 K  Assessment Sensory level: T10 Events: blood not aspirated, injection not painful, no injection resistance, negative IV test and no paresthesia  Additional Notes Pt. Evaluated and documentation done after procedure finished. Patient identified. Risks/Benefits/Options discussed with patient including but not limited to bleeding, infection, nerve damage, paralysis, failed block, incomplete pain control, headache, blood pressure changes, nausea, vomiting, reactions to medication both or allergic, itching and postpartum back pain. Confirmed with bedside nurse the patient's most recent platelet count. Confirmed with patient that they are not currently taking any anticoagulation, have any bleeding history or any family history of bleeding disorders. Patient expressed understanding and wished to proceed. All questions were answered. Sterile technique was used throughout the entire procedure. Please see nursing notes for vital signs. Test dose was given through epidural catheter and negative prior to continuing to dose epidural or start infusion. Warning signs of high block given to  the patient including shortness of breath, tingling/numbness in hands, complete motor block, or any concerning symptoms with instructions to call for help. Patient was given instructions on fall risk and not to get out of bed. All questions and concerns addressed with instructions to call with any issues or inadequate analgesia.   Patient tolerated the insertion well without immediate complications.Reason for block:procedure for pain

## 2017-05-21 NOTE — Anesthesia Preprocedure Evaluation (Addendum)
Anesthesia Evaluation  Patient identified by MRN, date of birth, ID band Patient awake    Reviewed: Allergy & Precautions, H&P , NPO status , Patient's Chart, lab work & pertinent test results, reviewed documented beta blocker date and time   Airway Mallampati: II  TM Distance: >3 FB Neck ROM: full    Dental no notable dental hx. (+) Teeth Intact   Pulmonary neg pulmonary ROS, Current Smoker,    Pulmonary exam normal breath sounds clear to auscultation       Cardiovascular Exercise Tolerance: Good hypertension, On Medications negative cardio ROS   Rhythm:regular Rate:Normal     Neuro/Psych negative neurological ROS  negative psych ROS   GI/Hepatic negative GI ROS, Neg liver ROS,   Endo/Other  negative endocrine ROSdiabetes  Renal/GU      Musculoskeletal   Abdominal   Peds  Hematology negative hematology ROS (+)   Anesthesia Other Findings Patient with multiple failed epidurals, now presenting for urgent c-section.  As long as patient and baby are stable plan for epidural removal and placement of a spinal.  Reproductive/Obstetrics (+) Pregnancy                            Anesthesia Physical Anesthesia Plan  ASA: III  Anesthesia Plan: Spinal   Post-op Pain Management:    Induction:   Airway Management Planned:   Additional Equipment:   Intra-op Plan:   Post-operative Plan:   Informed Consent: I have reviewed the patients History and Physical, chart, labs and discussed the procedure including the risks, benefits and alternatives for the proposed anesthesia with the patient or authorized representative who has indicated his/her understanding and acceptance.     Plan Discussed with:   Anesthesia Plan Comments:       Anesthesia Quick Evaluation

## 2017-05-21 NOTE — Anesthesia Procedure Notes (Signed)
Epidural Patient location during procedure: OB  Staffing Performed: anesthesiologist   Preanesthetic Checklist Completed: patient identified, site marked, surgical consent, pre-op evaluation, timeout performed, IV checked, risks and benefits discussed and monitors and equipment checked  Epidural Patient position: sitting Prep: Betadine Patient monitoring: heart rate, continuous pulse ox and blood pressure Approach: midline Location: L4-L5 Injection technique: LOR saline  Needle:  Needle type: Tuohy  Needle gauge: 17 G Needle length: 9 cm and 9 Needle insertion depth: 5 cm Catheter type: closed end flexible Catheter size: 19 Gauge Catheter at skin depth: 11 cm Test dose: negative and 1.5% lidocaine with Epi 1:200 K  Assessment Sensory level: T10 Events: blood not aspirated, injection not painful, no injection resistance, negative IV test and no paresthesia  Additional Notes   Patient tolerated the insertion well without complications.-SATD -IVTD. No paresthesia. Refer to [facility] nursing for VS and dosingReason for block:procedure for pain     

## 2017-05-21 NOTE — Anesthesia Procedure Notes (Signed)
Epidural Patient location during procedure: OB Start time: 05/21/2017 2:10 PM End time: 05/21/2017 2:35 PM  Staffing Anesthesiologist: Yves DillARROLL, PAUL Resident/CRNA: Malva CoganBEANE, Matisse Roskelley Performed: resident/CRNA   Preanesthetic Checklist Completed: patient identified, site marked, surgical consent, pre-op evaluation, timeout performed, IV checked, risks and benefits discussed and monitors and equipment checked  Epidural Patient position: sitting Prep: Betadine Patient monitoring: heart rate, continuous pulse ox and blood pressure Approach: midline Location: L4-L5 Injection technique: LOR saline  Needle:  Needle type: Tuohy  Needle gauge: 17 G Needle length: 9 cm and 9 Needle insertion depth: 5 cm Catheter type: closed end flexible Catheter size: 19 Gauge Catheter at skin depth: 11 cm Test dose: negative and 1.5% lidocaine with Epi 1:200 K  Assessment Sensory level: T10 Events: blood not aspirated, injection not painful, no injection resistance, negative IV test and no paresthesia  Additional Notes Pt. Evaluated and documentation done after procedure finished. Patient identified. Risks/Benefits/Options discussed with patient including but not limited to bleeding, infection, nerve damage, paralysis, failed block, incomplete pain control, headache, blood pressure changes, nausea, vomiting, reactions to medication both or allergic, itching and postpartum back pain. Confirmed with bedside nurse the patient's most recent platelet count. Confirmed with patient that they are not currently taking any anticoagulation, have any bleeding history or any family history of bleeding disorders. Patient expressed understanding and wished to proceed. All questions were answered. Sterile technique was used throughout the entire procedure. Please see nursing notes for vital signs. Test dose was given through epidural catheter and negative prior to continuing to dose epidural or start infusion. Warning signs  of high block given to the patient including shortness of breath, tingling/numbness in hands, complete motor block, or any concerning symptoms with instructions to call for help. Patient was given instructions on fall risk and not to get out of bed. All questions and concerns addressed with instructions to call with any issues or inadequate analgesia.   Patient tolerated the insertion well without immediate complications.Reason for block:procedure for pain

## 2017-05-22 ENCOUNTER — Encounter: Payer: Self-pay | Admitting: Anesthesiology

## 2017-05-22 ENCOUNTER — Encounter
Admission: EM | Disposition: A | Discharge status: Home / Self Care | Payer: Self-pay | Source: Home / Self Care | Attending: Obstetrics & Gynecology

## 2017-05-22 HISTORY — PX: VAGINAL SEPTOPLASTY: SHX5390

## 2017-05-22 LAB — RPR: RPR Ser Ql: NONREACTIVE

## 2017-05-22 SURGERY — Surgical Case
Anesthesia: Epidural | Wound class: Clean Contaminated

## 2017-05-22 MED ORDER — MIDAZOLAM HCL 2 MG/2ML IJ SOLN
INTRAMUSCULAR | Status: DC | PRN
  Administered 2017-05-22: 2 mg via INTRAVENOUS

## 2017-05-22 MED ORDER — SIMETHICONE 80 MG PO CHEW
80.0000 mg | CHEWABLE_TABLET | ORAL | Status: DC
  Administered 2017-05-24: 80 mg via ORAL
  Filled 2017-05-22 (×2): qty 1

## 2017-05-22 MED ORDER — ALBUTEROL SULFATE HFA 108 (90 BASE) MCG/ACT IN AERS
INHALATION_SPRAY | RESPIRATORY_TRACT | Status: DC | PRN
  Administered 2017-05-22: 6 via RESPIRATORY_TRACT

## 2017-05-22 MED ORDER — SIMETHICONE 80 MG PO CHEW: 80.0000 mg | CHEWABLE_TABLET | ORAL | Status: DC | PRN

## 2017-05-22 MED ORDER — OXYTOCIN 40 UNITS IN LACTATED RINGERS INFUSION - SIMPLE MED
2.5000 [IU]/h | INTRAVENOUS | Status: AC
  Administered 2017-05-22: 2.5 [IU]/h via INTRAVENOUS
  Filled 2017-05-22: qty 1000

## 2017-05-22 MED ORDER — COCONUT OIL OIL: 1.0000 "application " | TOPICAL_OIL | Status: DC | PRN

## 2017-05-22 MED ORDER — FENTANYL CITRATE (PF) 100 MCG/2ML IJ SOLN
25.0000 ug | INTRAMUSCULAR | Status: DC | PRN
  Administered 2017-05-22 (×3): 50 ug via INTRAVENOUS

## 2017-05-22 MED ORDER — MEASLES, MUMPS & RUBELLA VAC ~~LOC~~ INJ
0.5000 mL | INJECTION | Freq: Once | SUBCUTANEOUS | Status: DC
  Filled 2017-05-22: qty 0.5

## 2017-05-22 MED ORDER — METHYLERGONOVINE MALEATE 0.2 MG/ML IJ SOLN
INTRAMUSCULAR | Status: AC
  Filled 2017-05-22: qty 1

## 2017-05-22 MED ORDER — ONDANSETRON HCL 4 MG/2ML IJ SOLN: 4.0000 mg | Freq: Once | INTRAMUSCULAR | Status: DC | PRN

## 2017-05-22 MED ORDER — FENTANYL CITRATE (PF) 100 MCG/2ML IJ SOLN
INTRAMUSCULAR | Status: DC | PRN
  Administered 2017-05-22 (×2): 25 ug via INTRAVENOUS
  Administered 2017-05-22: 100 ug via INTRAVENOUS

## 2017-05-22 MED ORDER — OXYCODONE HCL 5 MG PO TABS
5.0000 mg | ORAL_TABLET | ORAL | Status: DC | PRN
  Administered 2017-05-22 – 2017-05-23 (×5): 5 mg via ORAL
  Filled 2017-05-22 (×5): qty 1

## 2017-05-22 MED ORDER — BUPIVACAINE HCL (PF) 0.25 % IJ SOLN
INTRAMUSCULAR | Status: DC | PRN
  Administered 2017-05-22: 30 mL

## 2017-05-22 MED ORDER — HYDROMORPHONE HCL 1 MG/ML IJ SOLN
INTRAMUSCULAR | Status: AC
  Administered 2017-05-22: 1 mg via INTRAVENOUS
  Filled 2017-05-22: qty 1

## 2017-05-22 MED ORDER — SODIUM CHLORIDE 0.9 % IV SOLN
INTRAVENOUS | Status: DC | PRN
  Administered 2017-05-22: 60 mL

## 2017-05-22 MED ORDER — PROPOFOL 10 MG/ML IV BOLUS
INTRAVENOUS | Status: DC | PRN
  Administered 2017-05-22: 200 mg via INTRAVENOUS

## 2017-05-22 MED ORDER — DEXAMETHASONE SODIUM PHOSPHATE 10 MG/ML IJ SOLN
INTRAMUSCULAR | Status: DC | PRN
  Administered 2017-05-22: 10 mg via INTRAVENOUS

## 2017-05-22 MED ORDER — CARBOPROST TROMETHAMINE 250 MCG/ML IM SOLN
INTRAMUSCULAR | Status: AC
  Filled 2017-05-22: qty 1

## 2017-05-22 MED ORDER — HYDROMORPHONE HCL 1 MG/ML IJ SOLN: 1.0000 mg | INTRAMUSCULAR | Status: DC | PRN

## 2017-05-22 MED ORDER — SUCCINYLCHOLINE CHLORIDE 20 MG/ML IJ SOLN
INTRAMUSCULAR | Status: DC | PRN
  Administered 2017-05-22: 200 mg via INTRAVENOUS

## 2017-05-22 MED ORDER — FENTANYL CITRATE (PF) 100 MCG/2ML IJ SOLN
INTRAMUSCULAR | Status: AC
  Administered 2017-05-22: 50 ug via INTRAVENOUS
  Filled 2017-05-22: qty 2

## 2017-05-22 MED ORDER — NEOSTIGMINE METHYLSULFATE 10 MG/10ML IV SOLN
INTRAVENOUS | Status: DC | PRN
  Administered 2017-05-22: 3 mg via INTRAVENOUS

## 2017-05-22 MED ORDER — FENTANYL CITRATE (PF) 100 MCG/2ML IJ SOLN
50.0000 ug | Freq: Once | INTRAMUSCULAR | Status: AC
  Administered 2017-05-22: 50 ug via INTRAVENOUS

## 2017-05-22 MED ORDER — PHENYLEPHRINE 40 MCG/ML (10ML) SYRINGE FOR IV PUSH (FOR BLOOD PRESSURE SUPPORT): 80.0000 ug | PREFILLED_SYRINGE | INTRAVENOUS | Status: DC | PRN

## 2017-05-22 MED ORDER — FENTANYL CITRATE (PF) 250 MCG/5ML IJ SOLN
INTRAMUSCULAR | Status: AC
  Filled 2017-05-22: qty 5

## 2017-05-22 MED ORDER — PRENATAL MULTIVITAMIN CH
1.0000 | ORAL_TABLET | Freq: Every day | ORAL | Status: DC
  Administered 2017-05-23 – 2017-05-25 (×3): 1 via ORAL
  Filled 2017-05-22 (×5): qty 1

## 2017-05-22 MED ORDER — ROCURONIUM BROMIDE 100 MG/10ML IV SOLN
INTRAVENOUS | Status: DC | PRN
  Administered 2017-05-22: 20 mg via INTRAVENOUS

## 2017-05-22 MED ORDER — GLYCOPYRROLATE 0.2 MG/ML IJ SOLN
INTRAMUSCULAR | Status: DC | PRN
  Administered 2017-05-22: 0.4 mg via INTRAVENOUS

## 2017-05-22 MED ORDER — BUPIVACAINE LIPOSOME 1.3 % IJ SUSP
INTRAMUSCULAR | Status: AC
  Filled 2017-05-22: qty 20

## 2017-05-22 MED ORDER — DIBUCAINE 1 % RE OINT: 1.0000 "application " | TOPICAL_OINTMENT | RECTAL | Status: DC | PRN

## 2017-05-22 MED ORDER — DIPHENHYDRAMINE HCL 25 MG PO CAPS: 25.0000 mg | ORAL_CAPSULE | Freq: Four times a day (QID) | ORAL | Status: DC | PRN

## 2017-05-22 MED ORDER — BUPIVACAINE LIPOSOME 1.3 % IJ SUSP: 20.0000 mL | Freq: Once | INTRAMUSCULAR | Status: DC

## 2017-05-22 MED ORDER — FLEET ENEMA 7-19 GM/118ML RE ENEM: 1.0000 | ENEMA | Freq: Every day | RECTAL | Status: DC | PRN

## 2017-05-22 MED ORDER — SIMETHICONE 80 MG PO CHEW
80.0000 mg | CHEWABLE_TABLET | Freq: Three times a day (TID) | ORAL | Status: DC
  Administered 2017-05-22 – 2017-05-25 (×7): 80 mg via ORAL
  Filled 2017-05-22 (×7): qty 1

## 2017-05-22 MED ORDER — OXYTOCIN 40 UNITS IN LACTATED RINGERS INFUSION - SIMPLE MED
INTRAVENOUS | Status: DC | PRN
  Administered 2017-05-22: 1000 mL via INTRAVENOUS
  Administered 2017-05-22: 200 mL via INTRAVENOUS

## 2017-05-22 MED ORDER — WITCH HAZEL-GLYCERIN EX PADS: 1.0000 "application " | MEDICATED_PAD | CUTANEOUS | Status: DC | PRN

## 2017-05-22 MED ORDER — ONDANSETRON HCL 4 MG/2ML IJ SOLN
INTRAMUSCULAR | Status: DC | PRN
  Administered 2017-05-22: 4 mg via INTRAVENOUS

## 2017-05-22 MED ORDER — MIDAZOLAM HCL 2 MG/2ML IJ SOLN
INTRAMUSCULAR | Status: AC
  Filled 2017-05-22: qty 2

## 2017-05-22 MED ORDER — BISACODYL 10 MG RE SUPP
10.0000 mg | Freq: Every day | RECTAL | Status: DC | PRN
  Administered 2017-05-24: 10 mg via RECTAL
  Filled 2017-05-22: qty 1

## 2017-05-22 MED ORDER — DEXTROSE 5 % IV SOLN
500.0000 mg | INTRAVENOUS | Status: DC
  Administered 2017-05-22: 500 mg via INTRAVENOUS
  Filled 2017-05-22 (×2): qty 500

## 2017-05-22 MED ORDER — ONDANSETRON 4 MG PO TBDP: 4.0000 mg | ORAL_TABLET | Freq: Four times a day (QID) | ORAL | Status: DC | PRN

## 2017-05-22 MED ORDER — LACTATED RINGERS IV SOLN
INTRAVENOUS | Status: DC
  Administered 2017-05-23: 06:00:00 via INTRAVENOUS

## 2017-05-22 MED ORDER — HYDROMORPHONE HCL 1 MG/ML IJ SOLN
1.0000 mg | INTRAMUSCULAR | Status: DC | PRN
  Administered 2017-05-22: 1 mg via INTRAVENOUS

## 2017-05-22 MED ORDER — CEFAZOLIN SODIUM-DEXTROSE 2-4 GM/100ML-% IV SOLN
2.0000 g | Freq: Four times a day (QID) | INTRAVENOUS | Status: AC
  Administered 2017-05-22 – 2017-05-23 (×4): 2 g via INTRAVENOUS
  Filled 2017-05-22 (×5): qty 100

## 2017-05-22 MED ORDER — MENTHOL 3 MG MT LOZG
1.0000 | LOZENGE | OROMUCOSAL | Status: DC | PRN
  Administered 2017-05-23 – 2017-05-24 (×5): 3 mg via ORAL
  Filled 2017-05-22 (×5): qty 9

## 2017-05-22 MED ORDER — SENNOSIDES-DOCUSATE SODIUM 8.6-50 MG PO TABS
2.0000 | ORAL_TABLET | ORAL | Status: DC
  Administered 2017-05-22 – 2017-05-25 (×3): 2 via ORAL
  Filled 2017-05-22 (×3): qty 2

## 2017-05-22 MED ORDER — IBUPROFEN 600 MG PO TABS
600.0000 mg | ORAL_TABLET | Freq: Four times a day (QID) | ORAL | Status: AC
  Administered 2017-05-22 – 2017-05-25 (×12): 600 mg via ORAL
  Filled 2017-05-22 (×13): qty 1

## 2017-05-22 MED ORDER — LIDOCAINE HCL (PF) 1 % IJ SOLN
INTRAMUSCULAR | Status: AC
  Filled 2017-05-22: qty 30

## 2017-05-22 MED ORDER — PHENYLEPHRINE HCL 10 MG/ML IJ SOLN
INTRAMUSCULAR | Status: DC | PRN
  Administered 2017-05-22: 100 ug via INTRAVENOUS
  Administered 2017-05-22 (×3): 200 ug via INTRAVENOUS

## 2017-05-22 MED ORDER — ACETAMINOPHEN 500 MG PO TABS
1000.0000 mg | ORAL_TABLET | Freq: Three times a day (TID) | ORAL | Status: AC
  Administered 2017-05-22 – 2017-05-23 (×4): 1000 mg via ORAL
  Filled 2017-05-22 (×4): qty 2

## 2017-05-22 MED ORDER — ALBUTEROL SULFATE HFA 108 (90 BASE) MCG/ACT IN AERS
INHALATION_SPRAY | RESPIRATORY_TRACT | Status: AC
  Filled 2017-05-22: qty 6.7

## 2017-05-22 MED ORDER — SOD CITRATE-CITRIC ACID 500-334 MG/5ML PO SOLN
ORAL | Status: AC
  Administered 2017-05-22: 30 mL
  Filled 2017-05-22: qty 15

## 2017-05-22 MED ORDER — CEFAZOLIN SODIUM-DEXTROSE 2-4 GM/100ML-% IV SOLN
2.0000 g | Freq: Once | INTRAVENOUS | Status: AC
  Administered 2017-05-22: 2 g via INTRAVENOUS
  Filled 2017-05-22: qty 100

## 2017-05-22 MED ORDER — LIDOCAINE HCL (CARDIAC) 20 MG/ML IV SOLN: INTRAVENOUS | Status: DC | PRN

## 2017-05-22 MED ORDER — SODIUM CHLORIDE 0.9 % IJ SOLN
INTRAMUSCULAR | Status: AC
  Filled 2017-05-22: qty 50

## 2017-05-22 MED ORDER — BUPIVACAINE HCL (PF) 0.5 % IJ SOLN
INTRAMUSCULAR | Status: AC
  Filled 2017-05-22: qty 30

## 2017-05-22 MED ORDER — HYDROMORPHONE HCL 1 MG/ML IJ SOLN
INTRAMUSCULAR | Status: AC
  Filled 2017-05-22: qty 1

## 2017-05-22 MED ORDER — TETANUS-DIPHTH-ACELL PERTUSSIS 5-2.5-18.5 LF-MCG/0.5 IM SUSP
0.5000 mL | Freq: Once | INTRAMUSCULAR | Status: AC
  Administered 2017-05-25: 0.5 mL via INTRAMUSCULAR
  Filled 2017-05-22: qty 0.5

## 2017-05-22 SURGICAL SUPPLY — 29 items
BARRIER ADHS 3X4 INTERCEED (GAUZE/BANDAGES/DRESSINGS) ×2 IMPLANT
BRR ADH 4X3 ABS CNTRL BYND (GAUZE/BANDAGES/DRESSINGS) ×2
CANISTER SUCT 3000ML PPV (MISCELLANEOUS) ×4 IMPLANT
CATH KIT ON-Q SILVERSOAK 5 (CATHETERS) ×2 IMPLANT
CATH KIT ON-Q SILVERSOAK 5IN (CATHETERS) ×4 IMPLANT
CHLORAPREP W/TINT 26ML (MISCELLANEOUS) ×4 IMPLANT
DRSG TELFA 3X8 NADH (GAUZE/BANDAGES/DRESSINGS) ×4 IMPLANT
ELECT REM PT RETURN 9FT ADLT (ELECTROSURGICAL) ×4
ELECTRODE REM PT RTRN 9FT ADLT (ELECTROSURGICAL) ×2 IMPLANT
GAUZE SPONGE 4X4 12PLY STRL (GAUZE/BANDAGES/DRESSINGS) ×4 IMPLANT
GOWN STRL REUS W/ TWL LRG LVL3 (GOWN DISPOSABLE) ×6 IMPLANT
GOWN STRL REUS W/TWL LRG LVL3 (GOWN DISPOSABLE) ×12
NS IRRIG 1000ML POUR BTL (IV SOLUTION) ×4 IMPLANT
PAD DRESSING TELFA 3X8 NADH (GAUZE/BANDAGES/DRESSINGS) ×2 IMPLANT
PAD OB MATERNITY 4.3X12.25 (PERSONAL CARE ITEMS) ×4 IMPLANT
PAD PREP 24X41 OB/GYN DISP (PERSONAL CARE ITEMS) ×4 IMPLANT
SPONGE LAP 18X18 5 PK (GAUZE/BANDAGES/DRESSINGS) ×2 IMPLANT
SUT MNCRL 4-0 (SUTURE)
SUT MNCRL 4-0 27XMFL (SUTURE)
SUT PDS AB 1 TP1 96 (SUTURE) ×4 IMPLANT
SUT PLAIN 2 0 XLH (SUTURE) ×4 IMPLANT
SUT PLAIN GUT 2-0 30 C14 SG823 (SUTURE) ×4
SUT VIC AB 0 CT1 36 (SUTURE) ×12 IMPLANT
SUT VIC AB 0 CTX 36 (SUTURE) ×4
SUT VIC AB 0 CTX36XBRD ANBCTRL (SUTURE) ×1 IMPLANT
SUT VIC AB 3-0 SH 27 (SUTURE) ×4
SUT VIC AB 3-0 SH 27X BRD (SUTURE) ×2 IMPLANT
SUTURE MNCRL 4-0 27XMF (SUTURE) IMPLANT
SUTURE PLN GUT2-0 30 C14 SG823 (SUTURE) ×2 IMPLANT

## 2017-05-22 NOTE — Progress Notes (Deleted)
Day of Surgery Subjective: Pain is adequately controlled. No SOB or CP. Resting quietly.   Objective: Vital signs in last 24 hours: Temp:  [97.8 F (36.6 C)-101.9 F (38.8 C)] 97.8 F (36.6 C) (05/23 1926) Pulse Rate:  [64-150] 103 (05/23 1926) Resp:  [11-26] 18 (05/23 1838) BP: (97-189)/(53-112) 125/61 (05/23 1926) SpO2:  [92 %-100 %] 97 % (05/23 1838)  Intake/Output  Intake/Output Summary (Last 24 hours) at 05/22/17 1950 Last data filed at 05/22/17 1746  Gross per 24 hour  Intake             1906 ml  Output             3350 ml  Net            -1444 ml    Physical Exam:  General: Alert and oriented. CV: RRR, +flow murmur Lungs: Clear bilaterally. GI: Soft, Nondistended. Incisions: Clean and dry. Urine: Clear, Foley in place Extremities: Nontender, no erythema, no edema.  Assessment/Plan: POD# 0 s/p dx lap, TAH/BS, repair enterotomy. New heart murmur- check CBC. Vitals stable, not tachy.  1) Encourage Incentive spirometry 2) Clears overnight. General surgery direct diet. 3) Continue oral pain medication, overnight PCA, 4) D/C Foley in am if clinically indicated 5) OOB to chair for 4 hours this afternoon   [name], MD   LOS: 2 days   [name] 05/22/2017, 7:50 PM  Patient ID: Carolyn Bennett, female   DOB: 09/05/1991, 26 y.o.   MRN: [id number]

## 2017-05-22 NOTE — Progress Notes (Signed)
Intrapartum note  S:  Feeling intense pain with contractions, "i can't do this"  O: BP (!) 147/89   Pulse (!) 123   Temp 98.8 F (37.1 C) (Oral)   Resp 20   Ht 5\' 3"  (1.6 m)   Wt 95.3 kg (210 lb)   LMP 08/19/2016 (Approximate)   SpO2 96%   BMI 37.20 kg/m   SVE: 7cm, 70%, -1 TOCO: q1-243min FHT: 125 mod + accels +short decels  A/P:  26yo G2P1001 @ 38.3 with IOL for preeclampsia  1. IUP: category 2 strip due to recent shallow/short variables.   2. IOL: on pitocin, in a good pattern an progressing.  3. Has had THREE epidurals placed, which work for a short time and then stop working.  Dr. Randa NgoPiscitello has offered to place another one and patient has so far refused.  Her support family and friends have been encouraged to help her through the remaining part of labor.  Have offered spinal as well.  TBD if she decides to proceed.  ----- Ranae Plumberhelsea Ward, MD Attending Obstetrician and Gynecologist [facility], Department of OB/GYN [facility]

## 2017-05-22 NOTE — Anesthesia Post-op Follow-up Note (Cosign Needed)
Anesthesia QCDR form completed.        

## 2017-05-22 NOTE — Op Note (Addendum)
Cesarean Section Procedure Note  Date of procedure: 05/22/2017   Pre-operative Diagnosis: Intrauterine pregnancy at 123w3d; iol for mild preE; arrest in second stage of labor; Cat II strip with deep variables.  Post-operative Diagnosis: same, delivered. True knot in umbilical cord. OP positioning. Small vaginal laceration from prior attempted vacuum. Uterine atony requiring Hemabate and pitocin.  Procedure: Primary Low Transverse Cesarean Section through Pfannenstiel incision; repair of vaginal laceration  Surgeon: Christeen DouglasBethany Zakeya Junker, MD  Assistant(s):  CST; Irven EasterlyJoey Stevens, PA-S  Anesthesia: General endotracheal anesthesia. Failed epidural in labor  Anesthesiologist: No responsible provider has been recorded for the case. Anesthesiologist: Yevette EdwardsAdams, James G, MD; Yves Dillarroll, Paul, MD; Lenard SimmerKarenz, Andrew, MD; Piscitello, Cleda MccreedyJoseph K, MD CRNA: Malva CoganBeane, Catherine, CRNA; Michaele OfferSavage, Kasey, CRNA  Estimated Blood Loss:  800 ml         Drains: none         Total IV Fluids: see anesthesia report  Urine Output: 275ml         Specimens: cord gas, cord blood venous         Complications:  None; patient tolerated the procedure well.         Disposition: PACU - hemodynamically stable.         Condition: stable  Findings:  A female infant in cephalic OP presentation. Amniotic fluid - Clear  Birth weight 3629 g.  Apgars of 8 and 9 at one and five minutes respectively.  Intact placenta with a three-vessel cord.  Grossly normal uterus, tubes and ovaries bilaterally. NO intraabdominal adhesions were noted.  True umbilical cord knot at the umbilical insertion. Maternal fever to 101.9 prior to surgery.  Baby arterial ph: 7.09 Indications: failed induction, failure to progress: arrest of descent, malpresentation: OP, non-reassuring fetal status and mild PreE  Procedure Details  26yo G2P1001 at 38+3wks, iol for PreEclampsia without severe features, now on iol Day #3, fully dilated, unable to push, baby at zero  station and Cat II strip with deep variable decels. Maternal fever and tachycardia. Attempted high vacuum by Dr. Elesa MassedWard abandoned due to vaginal tissue interference with one pull and one pop off, no descent. Small but bleeding right high vaginal laceration noted in LDR and repaired after c/s.   The patient was taken to the operating room and provided with adequate anesthesia as noted above.  She was prepped and draped in the normal sterile fashion in the dorsal supine position with a leftward tilt. A time-out was taken to ensure the correct patient would have the correct procedure, that her allergies were identified, and that antibiotics had been given as listed above. A Pfannensteil skin incision was made with the scalpel and carried through to the underlying layer of fascia.  The fascia was incised in the midline and the incision extended laterally with lateral traction. The fascia was retracted in cephalad/caudad fashion bluntly.  The underlying rectus muscles were dissected off with blunt retraction.  The rectus muscles were then separated in the midline, and the peritoneum identified, and entered bluntly. The peritoneal incision was extended superiorly and inferiorly with good visualization of the bladder.  The bladder blade was then inserted and the vesicouterine peritoneum identified.   A low transverse uterine incision was made with the scalpel above the bladder reflection and extended in a cephalad/caudal fashion bluntly.  The bladder blade was removed and the infant's head delivered atraumatically followed by the shoulders and body.  The mouth and nose were suctioned with the bulb, the cord clamped and cut, and the infant  handed off to waiting pediatricians. Cord gases were sent. A large true knot was noted very close to the umbilical insertion.   The placenta was then removed with gentle traction on the cord and uterine massage.  The uterus was then exteriorized and cleared of all clots and debris.   The uterine incision was repaired with 0 Vicryll in a running locked fashion.  A second layer of the same suture was used in an  imbricating fashion to obtain hemostasis. The posterior cul-de-sac was cleared of all clots. The uterus was returned to the abdominal cavity.  The uterine incision was reinspected and found to be hemostatic. The fascia was elevated and the rectus muscles and fascial surface were found to be hemostatic.  The fascia was closed with 0-Vicryl in a running fashion.  The subcutaneous tissues were then irrigated and Bovie electrosurgery was used to complete hemostasis.   The subcutaneous tissue was closed with 2-0 vicryl in running fashion. The skin was closed with  4-0 Monocryl in a subcuticular fashion and covered with Steri-strips and routine pressure dressing.   20ml (in 30 of 0.5% bupivicaine and 50ml of NSS) of liposomal bupivicaine placed in the fascial and skin lines.  After dressing the abdominal incision, attention was turned to the vagina. A small but bleeding right high vaginal laceration was noted and was repaired hemostatically with a figure of 8 stitch. No other bleeding noted.  Sponge, lap, needle, and instrument counts were correct times two.   The patient tolerated the procedure well and was taken to the recovery room in stable condition.  Christeen Douglas, MD 05/22/2017

## 2017-05-22 NOTE — Progress Notes (Signed)
Day of Surgery pLTCS Subjective: Pain is adequately controlled. No SOB or CP. Resting quietly.   Objective: Vital signs in last 24 hours: Temp:  [97.8 F (36.6 C)-101.9 F (38.8 C)] 97.8 F (36.6 C) (05/23 1926) Pulse Rate:  [64-150] 103 (05/23 1926) Resp:  [11-26] 18 (05/23 1838) BP: (97-189)/(53-112) 125/61 (05/23 1926) SpO2:  [92 %-100 %] 97 % (05/23 1838)  Intake/Output  Intake/Output Summary (Last 24 hours) at 05/22/17 1954 Last data filed at 05/22/17 1746  Gross per 24 hour  Intake             1906 ml  Output             3350 ml  Net            -1444 ml    Physical Exam:  General: Alert and oriented. CV: RRR Lungs: Clear bilaterally. GI: Soft, Nondistended. Incisions: Clean and dry. Urine: Clear, Foley in place Extremities: Nontender, no erythema, no edema.  Assessment/Plan: POD# 0 s/p pLTCS, vaginal lac repair.  1) Encourage Incentive spirometry 2) Advance diet as tolerated, but may do clears overnight 3) Continue oral pain medication, with IV rescue as needed 4) D/C Foley in am 5) OOB to chair for 4 hours this afternoon  Christeen DouglasBethany Daejon Lich, MD   LOS: 2 days   Christeen DouglasBethany Brandice Busser 05/22/2017, 7:54 PM  Patient ID: Carolyn Coombsrystal Carolyn Bennett, female   DOB: 12/07/1991, 26 y.o.   MRN: [id number]

## 2017-05-22 NOTE — Progress Notes (Signed)
26yo G2P1001 at 38+3wks, iol for PreEclampsia without severe features, now on iol Day #3, fully dilated, unable to push, baby at zero station and Cat II strip. Maternal fever and tachycardia. Attempted high vacuum abandoned due to vaginal tissue interference with one pull and one pop off, no descent.  I have spoken with patient and given arrest in second stage, fetal tachy, recommend proceed to primary c/s. Pt requesting same. Will give azithro and ancef. Pt requesting general anesthesia as no improvement in pain control over last 2 days with anesthesia.  The risks of cesarean section discussed with the patient included but were not limited to: bleeding which may require transfusion or reoperation; infection which may require antibiotics; injury to bowel, bladder, ureters or other surrounding organs; injury to the fetus; need for additional procedures including hysterectomy in the event of a life-threatening hemorrhage; placental abnormalities wth subsequent pregnancies, incisional problems, thromboembolic phenomenon and other postoperative/anesthesia complications. The patient concurred with the proposed plan, giving informed written consent for the procedure.   Patient has been NPO since yesterday she will remain NPO for procedure. Anesthesia and OR aware. Preoperative prophylactic antibiotics and SCDs ordered on call to the OR.  To OR when ready.

## 2017-05-22 NOTE — Anesthesia Procedure Notes (Signed)
Epidural Patient location during procedure: OB Start time: 05/22/2017 3:50 AM End time: 05/22/2017 3:52 AM  Staffing Anesthesiologist: Margorie JohnPISCITELLO, JOSEPH K Performed: anesthesiologist   Preanesthetic Checklist Completed: patient identified, site marked, surgical consent, pre-op evaluation, timeout performed, IV checked, risks and benefits discussed and monitors and equipment checked  Epidural Patient position: sitting Prep: Betadine Patient monitoring: heart rate, continuous pulse ox and blood pressure Approach: midline Location: L3-L4 Injection technique: LOR air  Needle:  Needle type: Tuohy  Needle gauge: 17 G Needle length: 9 cm and 9 Needle insertion depth: 8 cm Catheter type: closed end flexible Catheter size: 19 Gauge Catheter at skin depth: 13 cm Test dose: negative and 1.5% lidocaine with Epi 1:200 K  Assessment Sensory level: T10 Events: blood not aspirated, injection not painful, no injection resistance, negative IV test and no paresthesia  Additional Notes CSE attempted but unable to advance spinal needle through epidural needle and no CSF through spinal needle, so epidural placed with normal test dose and bolus.  Pt. Evaluated and documentation done after procedure finished. Patient identified. Risks/Benefits/Options discussed with patient including but not limited to bleeding, infection, nerve damage, paralysis, failed block, incomplete pain control, headache, blood pressure changes, nausea, vomiting, reactions to medication both or allergic, itching and postpartum back pain. Confirmed with bedside nurse the patient's most recent platelet count. Confirmed with patient that they are not currently taking any anticoagulation, have any bleeding history or any family history of bleeding disorders. Patient expressed understanding and wished to proceed. All questions were answered. Sterile technique was used throughout the entire procedure. Please see nursing notes for vital  signs. Test dose was given through epidural catheter and negative prior to continuing to dose epidural or start infusion. Warning signs of high block given to the patient including shortness of breath, tingling/numbness in hands, complete motor block, or any concerning symptoms with instructions to call for help. Patient was given instructions on fall risk and not to get out of bed. All questions and concerns addressed with instructions to call with any issues or inadequate analgesia.   Patient tolerated the insertion well without immediate complications.Reason for block:procedure for pain

## 2017-05-22 NOTE — Transfer of Care (Signed)
Immediate Anesthesia Transfer of Care Note  Patient: Carolyn Bennett  Procedure(s) Performed: Procedure(s): STAT CESAREAN SECTION (N/A) VAGINAL Laceration repair  Patient Location: PACU  Anesthesia Type:General  Level of Consciousness: awake and alert   Airway & Oxygen Therapy: Patient Spontanous Breathing and Patient connected to nasal cannula oxygen  Post-op Assessment: Report given to RN and Post -op Vital signs reviewed and stable  Post vital signs: Reviewed and stable  Last Vitals:  Vitals:   05/22/17 1200 05/22/17 1230  BP:    Pulse:    Resp:    Temp: 36.6 C (!) 38.8 C    Last Pain:  Vitals:   05/22/17 1230  TempSrc: Axillary  PainSc:       Patients Stated Pain Goal: 0 (05/22/17 1200)  Complications: No apparent anesthesia complications

## 2017-05-22 NOTE — Anesthesia Procedure Notes (Signed)
Procedure Name: Intubation Performed by: Malva CoganBEANE, Takiera Mayo Pre-anesthesia Checklist: Patient identified, Patient being monitored, Timeout performed, Emergency Drugs available and Suction available Patient Re-evaluated:Patient Re-evaluated prior to inductionOxygen Delivery Method: Circle system utilized Preoxygenation: Pre-oxygenation with 100% oxygen Intubation Type: IV induction, Rapid sequence and Cricoid Pressure applied Laryngoscope Size: 3 and Glidescope Grade View: Grade I Tube type: Oral Tube size: 7.0 mm Number of attempts: 1 Airway Equipment and Method: Video-laryngoscopy and Rigid stylet Placement Confirmation: ETT inserted through vocal cords under direct vision,  positive ETCO2 and breath sounds checked- equal and bilateral Secured at: 21 cm Tube secured with: Tape Dental Injury: Teeth and Oropharynx as per pre-operative assessment

## 2017-05-23 ENCOUNTER — Encounter: Payer: Self-pay | Admitting: Obstetrics and Gynecology

## 2017-05-23 LAB — CBC
HCT: 24.1 % — ABNORMAL LOW (ref 35.0–47.0)
HEMOGLOBIN: 8.1 g/dL — AB (ref 12.0–16.0)
MCH: 28.4 pg (ref 26.0–34.0)
MCHC: 33.5 g/dL (ref 32.0–36.0)
MCV: 84.9 fL (ref 80.0–100.0)
Platelets: 200 10*3/uL (ref 150–440)
RBC: 2.84 MIL/uL — AB (ref 3.80–5.20)
RDW: 15.5 % — ABNORMAL HIGH (ref 11.5–14.5)
WBC: 14.1 10*3/uL — ABNORMAL HIGH (ref 3.6–11.0)

## 2017-05-23 MED ORDER — MENTHOL 3 MG MT LOZG
1.0000 | LOZENGE | OROMUCOSAL | Status: DC | PRN
  Administered 2017-05-24: 3 mg via ORAL

## 2017-05-23 MED ORDER — OXYCODONE-ACETAMINOPHEN 5-325 MG PO TABS
2.0000 | ORAL_TABLET | ORAL | Status: DC | PRN
  Administered 2017-05-23 – 2017-05-25 (×11): 2 via ORAL
  Filled 2017-05-23 (×11): qty 2

## 2017-05-23 NOTE — Progress Notes (Addendum)
  Subjective:   "I feel fine"   Objective:  Blood pressure 120/63, pulse 99, temperature 98.2 F (36.8 C), temperature source Oral, resp. rate 18, height 5\' 3"  (1.6 m), weight 210 lb (95.3 kg), last menstrual period 08/19/2016, SpO2 98 %, unknown if currently breastfeeding.  General: NAD, A,A&O x 3 Heart: S1S2, RRR. No M/R/G. Pulmonary: no increased work of breathing, Resp reg and non-labored.  Abdomen: non-distended, non-tender, fundus firm at level of umbilicus -2 Incision: C/D/I Extremities: no edema, no erythema, no tenderness  Results for orders placed or performed during the hospital encounter of 05/20/17 (from the past 72 hour(s))  CBC     Status: Abnormal   Collection Time: 05/20/17  7:06 PM  Result Value Ref Range   WBC 11.2 (H) 3.6 - 11.0 K/uL   RBC 4.28 3.80 - 5.20 MIL/uL   Hemoglobin 12.4 12.0 - 16.0 g/dL   HCT 40.936.5 81.135.0 - 91.447.0 %   MCV 85.3 80.0 - 100.0 fL   MCH 29.1 26.0 - 34.0 pg   MCHC 34.1 32.0 - 36.0 g/dL   RDW 78.215.3 (H) 95.611.5 - 21.314.5 %   Platelets 276 150 - 440 K/uL  Type and screen [facility]     Status: None   Collection Time: 05/20/17  7:06 PM  Result Value Ref Range   ABO/RH(D) O POS    Antibody Screen NEG    Sample Expiration 05/23/2017   RPR     Status: None   Collection Time: 05/20/17  7:06 PM  Result Value Ref Range   RPR Ser Ql Non Reactive Non Reactive    Comment: (NOTE) Performed At: [facility] [address] [place], [place] [id number] Mila HomerHancock William F MD [id number]   Protein / creatinine ratio, urine     Status: Abnormal   Collection Time: 05/20/17  7:06 PM  Result Value Ref Range   Creatinine, Urine 24 mg/dL   Total Protein, Urine 10 mg/dL    Comment: NO NORMAL RANGE ESTABLISHED FOR THIS TEST   Protein Creatinine Ratio 0.42 (H) 0.00 - 0.15 mg/mg[Cre]  CBC     Status: Abnormal   Collection Time: 05/23/17  5:41 AM  Result Value Ref Range   WBC 14.1 (H) 3.6 - 11.0 K/uL   RBC 2.84 (L) 3.80 - 5.20 MIL/uL    Hemoglobin 8.1 (L) 12.0 - 16.0 g/dL   HCT 01.024.1 (L) 27.235.0 - 53.647.0 %   MCV 84.9 80.0 - 100.0 fL   MCH 28.4 26.0 - 34.0 pg   MCHC 33.5 32.0 - 36.0 g/dL   RDW 64.415.5 (H) 03.411.5 - 74.214.5 %   Platelets 200 150 - 440 K/uL     Assessment:   26 y.o. G2P2001 postoperativeday # 1   Plan:  1) Acute blood loss anemia - hemodynamically stable and asymptomatic - po ferrous sulfate  2) --/--/O POS (05/21 1906) / Immune (10/30 0000) / Varicella Immune  3) TDAP status: not found in records at 27 or 29 weeks as given   4) Disposition: continue PP care  5) Continue antibiotics till 2pm today and then dc IV

## 2017-05-23 NOTE — Anesthesia Postprocedure Evaluation (Addendum)
Anesthesia Post Note  Patient: Langston R Holleman  Procedure(s) Performed: Procedure(s) (LRB): STAT CESAREAN SECTION (N/A) VAGINAL Laceration repair  Patient location during evaluation: Mother Baby Anesthesia Type: General Level of consciousness: awake and alert and oriented Pain management: pain level controlled Vital Signs Assessment: post-procedure vital signs reviewed and stable Respiratory status: respiratory function stable Cardiovascular status: blood pressure returned to baseline Postop Assessment: no headache, no backache, epidural receding, patient able to bend at knees, no signs of nausea or vomiting and adequate PO intake Anesthetic complications: no     Last Vitals:  Vitals:   05/22/17 2353 05/23/17 0317  BP: 140/71 (!) 114/52  Pulse: (!) 106 100  Resp:  18  Temp: 37 C 36.7 C    Last Pain:  Vitals:   05/23/17 0420  TempSrc:   PainSc: Asleep                 Clydene PughBeane, Harini Dearmond D

## 2017-05-23 NOTE — Addendum Note (Signed)
Addendum  created 05/23/17 0725 by Malva CoganBeane, Dalayla Aldredge, CRNA   Sign clinical note

## 2017-05-24 NOTE — Progress Notes (Signed)
Subjective: Postpartum Day 2: Cesarean Delivery Patient reports no problems   Objective: Vital signs in last 24 hours: Temp:  [97.9 F (36.6 C)-98.5 F (36.9 C)] 97.9 F (36.6 C) (05/25 0801) Pulse Rate:  [87-109] 87 (05/25 0801) Resp:  [14-20] 14 (05/24 1939) BP: (103-132)/(53-73) 113/63 (05/25 0801) SpO2:  [98 %] 98 % (05/24 1939)  Physical Exam:  General: alert and cooperative Lochia: appropriate Uterine Fundus: firm Incision: healing well DVT Evaluation: No evidence of DVT seen on physical exam.   Recent Labs  05/23/17 0541  HGB 8.1*  HCT 24.1*    Assessment/Plan: Status post Cesarean section. Doing well postoperatively.  Continue current care.  Ihor Austinhomas J Merrit Friesen 05/24/2017, 8:36 AM

## 2017-05-24 NOTE — Care Management Note (Signed)
Case Management Note  Patient Details  Name: Carolyn CoombsCrystal R Bennett MRN: [id number] Date of Birth: 12/08/1991  Subjective/Objective:       Delivered per C/section 05/22/17  At [redacted] weeks gestation. Medicaid is listed as insurance.  Requested information about WIC.            Action/Plan:  Will give hard copy for contact information for [facility].  WIC is arranged through [facility]   Expected Discharge Date:                   Expected Discharge Plan:     In-House Referral:   yes  Discharge planning Services     Post Acute Care Choice:    Choice offered to:     DME Arranged:    DME Agency:     HH Arranged:    HH Agency:     Status of Service:     If discussed at [employer] Meetings, dates discussed:    Additional Comments:  Gwenette GreetBrenda S Shadi Larner, RN MSN CCM Care management [phone] 05/24/2017, 1:33 PM

## 2017-05-24 NOTE — Plan of Care (Signed)
Problem: Activity: Goal: Risk for activity intolerance will decrease Outcome: Progressing Up adlib in room, encourage to walk in hall  Problem: Bowel/Gastric: Goal: Will not experience complications related to bowel motility Outcome: Progressing Enc to walk in hall. Suppository given

## 2017-05-25 MED ORDER — MEDROXYPROGESTERONE ACETATE 150 MG/ML IM SUSP: 150.0000 mg | INTRAMUSCULAR | 3 refills | Status: DC

## 2017-05-25 MED ORDER — OXYCODONE-ACETAMINOPHEN 5-325 MG PO TABS: 2.0000 | ORAL_TABLET | ORAL | 0 refills | Status: DC | PRN

## 2017-05-25 MED ORDER — MEDROXYPROGESTERONE ACETATE 150 MG/ML IM SUSP
150.0000 mg | INTRAMUSCULAR | Status: DC
  Administered 2017-05-25: 150 mg via INTRAMUSCULAR
  Filled 2017-05-25: qty 1

## 2017-05-25 NOTE — Progress Notes (Signed)
Dc instructions given, follow up given. rx given. Verbalizes understanding. Dc home

## 2017-05-25 NOTE — Discharge Summary (Signed)
Obstetric Discharge Summary Reason for Admission: preeclampsia  Prenatal Procedures: fetal pericardial effusion  Intrapartum Procedures: cesarean: low cervical, transverse Postpartum Procedures: none Complications-Operative and Postpartum: none Hemoglobin  Date Value Ref Range Status  05/23/2017 8.1 (L) 12.0 - 16.0 g/dL Final   HGB  Date Value Ref Range Status  10/02/2012 13.9 12.0 - 16.0 g/dL Final   HCT  Date Value Ref Range Status  05/23/2017 24.1 (L) 35.0 - 47.0 % Final  10/03/2012 33.2 (L) 35.0 - 47.0 % Final    Physical Exam:  General: alert and cooperative Lochia: appropriate Uterine Fundus: firm Incision: healing well DVT Evaluation: No evidence of DVT seen on physical exam.  Discharge Diagnoses: Term Pregnancy-delivered and Preelampsia  Discharge Information: Date: 05/25/2017 Activity: pelvic rest Diet: routine Medications: Ibuprofen and Percocet, depo provera  Condition: stable Instructions: refer to practice specific booklet Discharge to: home   Newborn Data: Live born female  Birth Weight: 8 lb (3629 g) APGAR: 8, 9  Home with mother.  Ihor Austinhomas J Nikaya Nasby 05/25/2017, 1:12 PM

## 2017-05-30 NOTE — Addendum Note (Signed)
Addendum  created 05/30/17 1642 by Malva CoganBeane, Ramere Downs, CRNA   Anesthesia Event edited

## 2018-10-14 DIAGNOSIS — R739 Hyperglycemia, unspecified: Secondary | ICD-10-CM | POA: Insufficient documentation

## 2018-10-14 DIAGNOSIS — E785 Hyperlipidemia, unspecified: Secondary | ICD-10-CM | POA: Insufficient documentation

## 2018-10-14 DIAGNOSIS — E039 Hypothyroidism, unspecified: Secondary | ICD-10-CM | POA: Insufficient documentation

## 2020-01-19 ENCOUNTER — Encounter: Payer: Self-pay | Admitting: Emergency Medicine

## 2020-01-19 ENCOUNTER — Emergency Department
Admission: EM | Admit: 2020-01-19 | Discharge: 2020-01-19 | Disposition: A | Discharge status: Home / Self Care | Payer: Medicaid Other | Attending: Student | Admitting: Student

## 2020-01-19 ENCOUNTER — Other Ambulatory Visit: Payer: Self-pay

## 2020-01-19 ENCOUNTER — Emergency Department: Payer: Medicaid Other

## 2020-01-19 DIAGNOSIS — R0789 Other chest pain: Secondary | ICD-10-CM | POA: Insufficient documentation

## 2020-01-19 DIAGNOSIS — L749 Eccrine sweat disorder, unspecified: Secondary | ICD-10-CM | POA: Insufficient documentation

## 2020-01-19 DIAGNOSIS — F439 Reaction to severe stress, unspecified: Secondary | ICD-10-CM | POA: Insufficient documentation

## 2020-01-19 DIAGNOSIS — R079 Chest pain, unspecified: Secondary | ICD-10-CM | POA: Diagnosis present

## 2020-01-19 DIAGNOSIS — R61 Generalized hyperhidrosis: Secondary | ICD-10-CM

## 2020-01-19 HISTORY — DX: Disorder of thyroid, unspecified: E07.9

## 2020-01-19 HISTORY — DX: Anxiety disorder, unspecified: F41.9

## 2020-01-19 HISTORY — DX: Unspecified pre-eclampsia, unspecified trimester: O14.90

## 2020-01-19 LAB — BASIC METABOLIC PANEL
Anion gap: 12 (ref 5–15)
BUN: 8 mg/dL (ref 6–20)
CO2: 20 mmol/L — ABNORMAL LOW (ref 22–32)
Calcium: 9.5 mg/dL (ref 8.9–10.3)
Chloride: 108 mmol/L (ref 98–111)
Creatinine, Ser: 0.68 mg/dL (ref 0.44–1.00)
GFR calc Af Amer: >60 mL/min (ref >60)
GFR calc non Af Amer: >60 mL/min (ref >60)
Glucose, Bld: 103 mg/dL — ABNORMAL HIGH (ref 70–99)
Potassium: 4 mmol/L (ref 3.5–5.1)
Sodium: 140 mmol/L (ref 135–145)

## 2020-01-19 LAB — TROPONIN I (HIGH SENSITIVITY): Troponin I (High Sensitivity): <2 ng/L (ref <18)

## 2020-01-19 LAB — CBC
HCT: 44.1 % (ref 36.0–46.0)
Hemoglobin: 14.4 g/dL (ref 12.0–15.0)
MCH: 28.8 pg (ref 26.0–34.0)
MCHC: 32.7 g/dL (ref 30.0–36.0)
MCV: 88.2 fL (ref 80.0–100.0)
Platelets: 343 10*3/uL (ref 150–400)
RBC: 5 MIL/uL (ref 3.87–5.11)
RDW: 11.9 % (ref 11.5–15.5)
WBC: 6.9 10*3/uL (ref 4.0–10.5)
nRBC: 0 % (ref 0.0–0.2)

## 2020-01-19 LAB — POCT PREGNANCY, URINE: Preg Test, Ur: NEGATIVE

## 2020-01-19 MED ORDER — KETOROLAC TROMETHAMINE 30 MG/ML IJ SOLN
30.0000 mg | Freq: Once | INTRAMUSCULAR | Status: AC
  Administered 2020-01-19: 15:00:00 30 mg via INTRAMUSCULAR
  Filled 2020-01-19: qty 1

## 2020-01-19 MED ORDER — LORAZEPAM 0.5 MG PO TABS
0.5000 mg | ORAL_TABLET | Freq: Once | ORAL | Status: AC
  Administered 2020-01-19: 0.5 mg via ORAL
  Filled 2020-01-19: qty 1

## 2020-01-19 NOTE — ED Triage Notes (Addendum)
Patient reports intermittent chest pain for the past several days. States today pain became worse and more consistent. Patient reports sharp pain and heaviness to center of chest accompanied by sweating and blurred vision. Denies SOB or dizziness. Reports history of anxiety but states this pain is different.   Pt tearful in triage. Reports she has had worsening symptoms due to stress in her life for the last year.

## 2020-01-19 NOTE — ED Provider Notes (Signed)
[facility] Emergency Department Provider Note  ____________________________________________   First MD Initiated Contact with Patient 01/19/20 1424     (approximate)  I have reviewed the triage vital signs and the nursing notes.  History  Chief Complaint Chest Pain    HPI Carolyn Bennett is a 29 y.o. female with history of anxiety, thyroid disease who presents to the emergency department for chest discomfort, sweatiness, blurred vision, headache, anxiety.   Patient states that she has been arguing with her husband for about a month or so, but today it escalated to a more significant verbal disagreement.  Shortly after this argument she began to feel chest discomfort.  She describes this as feeling as though someone is punching her in the center of her chest.  Currently 3/10 in severity, without radiation, alleviating or aggravating components.  She states she is experienced associated blurry vision (now resolved), sweatiness, headache.  Since being in the waiting room she has been able to calm herself down, which has improved her symptoms.  She reports a history of anxiety with similar type symptoms, but never this severe.  She has a PCP appointment scheduled for next week to establish care and discuss some of the symptoms, but is not currently on any anxiety medications.   Past Medical Hx Past Medical History:  Diagnosis Date  . Anxiety   . Preeclampsia   . Thyroid disease     Problem List Patient Active Problem List   Diagnosis Date Noted  . Preeclampsia 05/20/2017  . Indication for care in labor and delivery, antepartum 04/29/2017  . Pelvic pressure in pregnancy 03/27/2017    Past Surgical Hx Past Surgical History:  Procedure Laterality Date  . CESAREAN SECTION N/A 05/22/2017   Procedure: STAT CESAREAN SECTION;  Surgeon: Benjaman Kindler, MD;  Location: [facility] ORS;  Service: Obstetrics;  Laterality: N/A;  . VAGINAL SEPTOPLASTY  05/22/2017   Procedure: VAGINAL Laceration repair;  Surgeon: Benjaman Kindler, MD;  Location: [facility] ORS;  Service: Obstetrics;;    Medications Prior to Admission medications   Medication Sig Start Date End Date Taking? Authorizing Provider  medroxyPROGESTERone (DEPO-PROVERA) 150 MG/ML injection Inject 1 mL (150 mg total) into the muscle every 3 (three) months. 05/25/17   Schermerhorn, Gwen Her, MD    Allergies Patient has no known allergies.  Family Hx Family History  Problem Relation Age of Onset  . Diabetes Father   . Cancer Father     Social Hx Social History   Tobacco Use  . Smoking status: Never Smoker  . Smokeless tobacco: Never Used  Substance Use Topics  . Alcohol use: No  . Drug use: No     Review of Systems  Constitutional: Negative for fever, chills. Eyes: Negative for visual changes. ENT: Negative for sore throat. Cardiovascular: + for chest pain. Respiratory: Negative for shortness of breath. Gastrointestinal: Negative for nausea, vomiting.  Genitourinary: Negative for dysuria. Musculoskeletal: Negative for leg swelling. Skin: Negative for rash. + sweatiness Neurological: + for for headaches.   Physical Exam  Vital Signs: ED Triage Vitals  Enc Vitals Group     BP 01/19/20 1145 (!) 164/81     Pulse Rate 01/19/20 1145 100     Resp 01/19/20 1145 (!) 22     Temp 01/19/20 1145 98.4 F (36.9 C)     Temp Source 01/19/20 1145 Oral     SpO2 01/19/20 1145 100 %     Weight 01/19/20 1146 170 lb (77.1 kg)  Height 01/19/20 1146 5\' 4"  (1.626 m)     Head Circumference --      Peak Flow --      Pain Score 01/19/20 1145 3     Pain Loc --      Pain Edu? --      Excl. in GC? --     Constitutional: Alert and oriented.  Head: Normocephalic. Atraumatic. Eyes: Conjunctivae clear. Sclera anicteric. Nose: No congestion. No rhinorrhea. Mouth/Throat: Wearing mask.  Neck: No stridor.   Cardiovascular: Normal rate, regular rhythm. Extremities well perfused. Respiratory:  Normal respiratory effort.  Lungs CTAB. Gastrointestinal: Soft. Non-tender. Non-distended.  Musculoskeletal: No lower extremity edema. No deformities. Neurologic:  Normal speech and language. No gross focal neurologic deficits are appreciated.  Skin: Skin is warm, dry and intact. No rash noted. Psychiatric: Mood and affect are appropriate for situation.  EKG  Personally reviewed.   Rate: 103 Rhythm: sinus Axis: normal, borderline leftward Intervals: WNL No acute ischemic changes No evidence of WPW, Brugada, prolonged QTC No STEMI    Radiology  CXR: IMPRESSION:  No acute abnormality of the lungs.    Procedures  Procedure(s) performed (including critical care):  Procedures   Initial Impression / Assessment and Plan / ED Course  29 y.o. female who presents to the ED for symptoms as above, including chest discomfort, sweatiness, headache, in setting of high stress/anxiety.  Ddx: stress response, anxiety vs atypical ACS vs electrolyte abnormality vs pregnancy vs thyroid disease  Will obtain basic labs, EKG, symptom control and reassess.  Labs without actionable derangements.  EKG without acute ischemic changes, troponin negative.  Negative pregnancy.  Suspect likely anxiety/stress response.  Advised outpatient follow-up, patient states she has scheduled an appointment for next week to establish with a PCP and further discuss her symptoms, she will have her thyroid check at this appointment.  Advised adherence with this.  Given return precautions.  Patient is comfortable with plan and discharge at this time.  Final Clinical Impression(s) / ED Diagnosis  Final diagnoses:  Stress  Chest discomfort  Sweaty skin       Note:  This document was prepared using Dragon voice recognition software and may include unintentional dictation errors.   37., MD 01/19/20 [phone]

## 2020-01-19 NOTE — Discharge Instructions (Addendum)
Your CBC, BMP, troponin, and EKG here were reassuring and did not indicate that you need to stay in the hospital at this time.   Please follow up with your general doctor next week to review your ER visit and follow up on your symptoms.   Please return to the ER for any new or worsening symptoms.

## 2020-01-19 NOTE — ED Notes (Signed)
See triage note  Presents with several day hx of intermittent chest   States pian became worse today

## 2020-01-25 NOTE — Progress Notes (Signed)
Patient: Carolyn Bennett, Female    DOB: 04/28/1991, 29 y.o.   MRN: [id number] Visit Date: 01/25/2020  Today's Provider: Jairo BenMichelle Smith Flinchum, FNP   Chief Complaint  Patient presents with  . New Patient (Initial Visit)   Subjective:    New Patient Carolyn Bennett is a 29 y.o. female who presents today for health maintenance and establish care, patient was a previous patient at [facility] and states that her [facility] is at [facility] last pap was done in 2018 and patient reports was normal.  She feels fairly well, patient states that she was recently seen in the ED on 1/19 with complaints of chest pain. Patient reports that she has been under emotional stress and financial stress that has been affecting her daily life. Patient reports that in the past year she has had changed jobs twice, she reports that she suffered a loss ion her family when she lost her grandfather this past year. Patient reports since the loss of her grandfather she has noticed difficulty sleeping at night and has noticed at times her heart beats faster than normal when she is stressed or worried.   Patient reports that she has a history of hyperthyroid and has been off medication for over 1 year patient states that last time thyroid was checked her value was very low.    Patient also has concerns today of exposure to Influenza B, patient states that two weeks ago her son was diagnosed and she noticed between the dates of 01/22/20-01/24/20 her temperature at home ranged from 99.6-100.8, patient also reports recent onset of headaches. Patient states that she tested negative for Covid 19 on 01/22/20.  She reports she is not exercising. She reports she is sleeping poorly, patient reports difficulty falling and staying asleep and states that she has been sweating more at night time and is unsure if its related to her thyroid Patient reports that she has been taking otc melatonin with no  relief, patient lives with her grandmother and states that her grandmother reported same issues at night time and believes that it could be a sign of depression/anxiety since the loss of patients grandfather over 1 year ago.  She has no suicidal / homicidal thoughts or intents and denies any in the past.    She has a 30 year old son and 2 year daughter.She reports her husband is supportive.   She is on DepoVera regularly. Denies any chance of pregnancy. ER urine pregnancy   She stopped her thyroid medication years ago. She ran out of medication due to losing insurance. She has medicaid now.   She has [facility] a counselor at [facility] in past. She has never taken medication in the past. She has had increased stress.  She lost her grandpa in February 2020, and reports everything has gone down hill from there. She reports increased stressors. 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  Son has been sick with flu.  1/22 100.8 temp, and 1/23 and 1/24.     [facility] she went and covid test on 01/22/2020 was negative. She reports during middle of visit  after seen in ER for stress she had headache and then developed sore throat, low grade fever, sweating night. She is afebrile today. She reports she has congestion and tightness in her chest that has since the day after her emergency room visit. She had  chest pains that day at ER and  Ativan was given and she reports her symptoms resolved. Since that time congestion in chest has increased.  She has had no exposure covid she reports however her 34 year old son tested positive for Flu ( does not know strain) around 3 weeks ago was tested at pediatrician. Covid was negative for her son.   She has no suicidal / homicidal thoughts or intents and denies any in the past.    33 year old son and 2 year daughter.She reports her husband is supportive.   She is on DepoVera regularly.   She stopped her thyroid  medication years ago. She ran out of medication due to losing insurance. She has medicaid now.   Denies any cardiac family history.   Patient  denies any rash, no chest pain today , shortness of breath, nausea, vomiting, or diarrhea.   -----------------------------------------------------------------   Review of Systems  Constitutional: Positive for activity change, appetite change, chills, diaphoresis, fatigue and fever (none today). Negative for unexpected weight change.  HENT: Positive for congestion, postnasal drip, rhinorrhea and sore throat. Negative for dental problem, drooling, ear discharge, ear pain, facial swelling, hearing loss, mouth sores, nosebleeds, sinus pressure, sinus pain, sneezing, tinnitus, trouble swallowing and voice change.   Eyes: Negative.   Respiratory: Positive for cough (occasional ), chest tightness and wheezing. Negative for apnea, choking, shortness of breath and stridor.   Cardiovascular: Negative for chest pain, palpitations and leg swelling.  Gastrointestinal: Negative.   Endocrine: Negative for cold intolerance, heat intolerance, polydipsia, polyphagia and polyuria.       Stopped thyroid medication year ago almost 2 years ago due to loss of insurance.   Genitourinary: Negative.   Musculoskeletal: Negative.   Allergic/Immunologic: Negative for environmental allergies, food allergies and immunocompromised state.  Neurological: Positive for light-headedness and headaches.  Hematological: Negative for adenopathy. Does not bruise/bleed easily.  Psychiatric/Behavioral: Positive for decreased concentration and sleep disturbance. Negative for agitation, behavioral problems, confusion, dysphoric mood, hallucinations, self-injury and suicidal ideas. The patient is nervous/anxious. The patient is not hyperactive.   All other systems reviewed and are negative.   Social History She  reports that she has never smoked. She has never used smokeless tobacco. She  reports that she does not drink alcohol or use drugs. Social History   Socioeconomic History  . Marital status: Married    Spouse name: Not on file  . Number of children: Not on file  . Years of education: Not on file  . Highest education level: Not on file  Occupational History  . Not on file  Tobacco Use  . Smoking status: Never Smoker  . Smokeless tobacco: Never Used  Substance and Sexual Activity  . Alcohol use: No  . Drug use: No  . Sexual activity: Yes    Birth control/protection: Injection  Other Topics Concern  . Not on file  Social History Narrative  . Not on file   Social Determinants of Health   Financial Resource Strain:   . Difficulty of Paying Living Expenses: Not on file  Food Insecurity:   . Worried About Programme researcher, broadcasting/film/video in the Last Year: Not on file  . Ran Out of Food in the Last Year: Not on file  Transportation Needs:   . Lack of Transportation (Medical): Not on file  . Lack of Transportation (Non-Medical): Not on file  Physical Activity:   . Days of Exercise per Week: Not on file  . Minutes of  Exercise per Session: Not on file  Stress:   . Feeling of Stress : Not on file  Social Connections:   . Frequency of Communication with Friends and Family: Not on file  . Frequency of Social Gatherings with Friends and Family: Not on file  . Attends Religious Services: Not on file  . Active Member of Clubs or Organizations: Not on file  . Attends Club or Organization Meetings: Not on file  . BankerMarital Status: Not on file    Patient Active Problem List   Diagnosis Date Noted  . Preeclampsia 05/20/2017  . Indication for care in labor and delivery, antepartum 04/29/2017  . Pelvic pressure in pregnancy 03/27/2017    Past Surgical History:  Procedure Laterality Date  . CESAREAN SECTION N/A 05/22/2017   Procedure: STAT CESAREAN SECTION;  Surgeon: Christeen DouglasBeasley, Bethany, MD;  Location: [facility] ORS;  Service: Obstetrics;  Laterality: N/A;  . VAGINAL SEPTOPLASTY   05/22/2017   Procedure: VAGINAL Laceration repair;  Surgeon: Christeen DouglasBeasley, Bethany, MD;  Location: [facility] ORS;  Service: Obstetrics;;    Family History  Family Status  Relation Name Status  . Father  (Not Specified)   Her family history includes Cancer in her father; Diabetes in her father.     No Known Allergies  Previous Medications   MEDROXYPROGESTERONE (DEPO-PROVERA) 150 MG/ML INJECTION    Inject 1 mL (150 mg total) into the muscle every 3 (three) months.    Patient Care Team: Patient, No Pcp Per as PCP - General (General Practice)      Objective:   Vitals: Blood pressure 140/80, pulse 83, temperature (!) 96.8 F (36 C), temperature source temporal , resp. rate 16, height 5\' 4"  (1.626 m), weight 161 lb 6.4 oz (73.2 kg), last menstrual period 11/01/2019, SpO2 99 %, not currently breastfeeding.  Physical Exam Constitutional:      General: She is not in acute distress.    Appearance: Normal appearance. She is normal weight. She is not ill-appearing, toxic-appearing or diaphoretic.  HENT:     Head: Normocephalic and atraumatic.     Jaw: There is normal jaw occlusion.     Right Ear: Hearing, tympanic membrane, ear canal and external ear normal.     Left Ear: Hearing, tympanic membrane, ear canal and external ear normal.     Nose: Mucosal edema, congestion and rhinorrhea present. No nasal tenderness.     Mouth/Throat:     Mouth: Mucous membranes are moist.     Tongue: Tongue does not deviate from midline.     Pharynx: Posterior oropharyngeal erythema present. No pharyngeal swelling, oropharyngeal exudate or uvula swelling.     Tonsils: No tonsillar exudate or tonsillar abscesses.  Eyes:     Pupils: Pupils are equal, round, and reactive to light.  Neck:     Vascular: No carotid bruit.  Cardiovascular:     Rate and Rhythm: Normal rate and regular rhythm.     Pulses: Normal pulses.     Heart sounds: Normal heart sounds. No murmur. No friction rub. No gallop.   Pulmonary:      Effort: Pulmonary effort is normal. No respiratory distress.     Breath sounds: No stridor. Examination of the right-upper field reveals wheezing. Examination of the left-upper field reveals wheezing. Wheezing present. No decreased breath sounds, rhonchi or rales.     Comments: Bronchial congestion noted in room and on exam  Chest:     Chest wall: No mass, swelling, tenderness or crepitus.  Abdominal:  General: Bowel sounds are normal. There is no distension.     Palpations: Abdomen is soft. There is no mass.     Tenderness: There is no abdominal tenderness. There is no right CVA tenderness, left CVA tenderness, guarding or rebound.     Hernia: No hernia is present.  Musculoskeletal:        General: No swelling, tenderness, deformity or signs of injury. Normal range of motion.     Cervical back: Normal range of motion and neck supple. No rigidity or tenderness.     Right lower leg: No edema.     Left lower leg: No edema.  Lymphadenopathy:     Cervical: No cervical adenopathy.  Skin:    General: Skin is warm and dry.     Capillary Refill: Capillary refill takes less than 2 seconds.     Findings: No erythema or rash.  Neurological:     General: No focal deficit present.     Mental Status: She is alert and oriented to person, place, and time.     Cranial Nerves: No cranial nerve deficit.     Sensory: No sensory deficit.     Motor: No weakness.     Coordination: Coordination normal.     Gait: Gait normal.     Deep Tendon Reflexes: Reflexes normal.  Psychiatric:        Attention and Perception: Attention and perception normal.        Mood and Affect: Mood is anxious. Mood is not depressed or elated. Affect is not labile, blunt, flat, angry, tearful or inappropriate.        Speech: Speech normal.        Behavior: Behavior normal. Behavior is cooperative.        Thought Content: Thought content normal.        Cognition and Memory: Cognition normal.        Judgment: Judgment normal.        Depression Screen Depression screen [facility] 2/9 01/26/2020  Decreased Interest 2  Down, Depressed, Hopeless 2  PHQ - 2 Score 4  Altered sleeping 2  Tired, decreased energy 2  Change in appetite 0  Feeling bad or failure about yourself  0  Trouble concentrating 0  Moving slowly or fidgety/restless 0  Suicidal thoughts 0  PHQ-9 Score 8  Difficult doing work/chores Very difficult   GAD 7 : Generalized Anxiety Score 01/26/2020  Nervous, Anxious, on Edge 3  Control/stop worrying 3  Worry too much - different things 3  Trouble relaxing 3  Restless 3  Easily annoyed or irritable 3  Afraid - awful might happen 3  Total GAD 7 Score 21  Anxiety Difficulty Very difficult       Assessment & Plan:     Routine Health Maintenance and Physical Exam  Exercise Activities and Dietary recommendations Goals   None     Immunization History  Administered Date(s) Administered  . Tdap 05/25/2017    Discussed health benefits of physical activity, and encouraged her to engage in regular exercise appropriate for her age and condition.     1. Routine health maintenance Healthy lifestyle and exercise  discussed. Relaxation recommended.  - POCT urinalysis dipstick  2. Stress She has been under an increased amount of stress, with COVID-19 pandemic, and job insecurities.  She is in agreement to start Lexapro 10 mg daily, discussed side effects and black box warning and when to seek emergency medical attention or reach out to the office if  nonurgent.  She is advised that her underlying thyroid disease could be playing into some of her anxiety as well we will check that level today.  She stopped her thyroid medication 1 year ago she reports possibly almost 2 years ago.  - TSH - Comprehensive Metabolic Panel (CMET) - CBC with Differential/Platelet - VITAMIN D 25 Hydroxy (Vit-D Deficiency, Fractures) - Lipid Panel w/o Chol/HDL Ratio - Ambulatory referral to Psychiatry  3. Depression  with anxiety Given information in her handout for psychiatric resources, psychiatric referral for counseling was also given.  Walk-in information for [facility] clinic also given if needed.  We will also need to repeat GAD-7 and PHQ-9 at next visit since she will be starting Lexapro. - Ambulatory referral to Psychiatry  4. Wheezing Patient came to the office feeling to report respiratory symptoms to the staff, she is wheezing mildly on exam.  She has chest congestion. Prednisone Dosepak given for chest congestion and chest tightness.  She can use over-the-counter Mucinex.  Inhaler was also given and side effects were discussed and not to overuse.  Side effects of prednisone as well.  - DG Chest 2 View; Future  5. Fever and chills Fever and chills have resolved per patient over the last 2 days.  Check chest x-ray to rule out any pneumonia. - DG Chest 2 View; Future  6. Routine adult health maintenance Healthy lifestyle and diet commended, routine labs.  Review immunizations at next visit.  7. Upper respiratory tract infection, unspecified type Possible influenza given recent exposure to her son over the last 3 weeks, however she has had symptoms past the.  She received Tamiflu.  She will also go for repeat Covid testing today at [facility], was scheduled at 11 AM.  8. Exposure to influenza No flu testing available due to COVID-19 pandemic.  9. Suspected COVID-19 virus infection We will have testing today at 11 AM.  [facility] office protocol with patient and that we are not seeing respiratory symptoms in the clinic, visit follow-up will have to be virtual if she is still having respiratory symptoms.  Patient verbalized understanding.  Due to respiratory symptoms she will have labs and chest x ray done at [facility] today as well covid testing. Wear mask and inform staff of symptoms.   Meds ordered this encounter  Medications  . predniSONE (STERAPRED UNI-PAK 21 TAB) 10 MG (21) TBPK  tablet    Sig: PO: Take 6 tablets on day 1:Take 5 tablets day 2:Take 4 tablets day 3: Take 3 tablets day 4:Take 2 tablets day five: 5 Take 1 tablet day 6    Dispense:  21 tablet    Refill:  0  . albuterol (VENTOLIN HFA) 108 (90 Base) MCG/ACT inhaler    Sig: Inhale 1-2 puffs into the lungs every 6 (six) hours as needed for wheezing or shortness of breath.    Dispense:  18 g    Refill:  0  . escitalopram (LEXAPRO) 10 MG tablet    Sig: Take 1 tablet (10 mg total) by mouth daily.    Dispense:  30 tablet    Refill:  0   Orders Placed This Encounter  Procedures  . DG Chest 2 View  . TSH  . Comprehensive Metabolic Panel (CMET)  . CBC with Differential/Platelet  . VITAMIN D 25 Hydroxy (Vit-D Deficiency, Fractures)  . Lipid Panel w/o Chol/HDL Ratio  . Ambulatory referral to Psychiatry  . POCT urinalysis dipstick   Advised patient call the office or your  primary care doctor for an appointment if no improvement within 72 hours or if any symptoms change or worsen at any time  Advised ER or urgent Care if after hours or on weekend. Call 911 for emergency symptoms at any time.Patinet verbalized understanding of all instructions given/reviewed and treatment plan and has no further questions or concerns at this time.    Anxiety/depression follow-up in 1 month, sooner if needed.  The entirety of the information documented in the History of Present Illness, Review of Systems and Physical Exam were personally obtained by me. Portions of this information were initially documented by the  Certified Medical Assistant whose name is documented in [facility] and reviewed by me for thoroughness and accuracy.  I have personally performed the exam and reviewed the chart and it is accurate to the best of my knowledge.  Haematologist has been used and any errors in dictation or transcription are unintentional.  Kelby Aline. [facility]  [facility]   --------------------------------------------------------------------

## 2020-01-26 ENCOUNTER — Ambulatory Visit: Payer: Medicaid Other | Attending: Internal Medicine

## 2020-01-26 ENCOUNTER — Ambulatory Visit (INDEPENDENT_AMBULATORY_CARE_PROVIDER_SITE_OTHER): Payer: Medicaid Other | Admitting: Adult Health

## 2020-01-26 ENCOUNTER — Other Ambulatory Visit
Admission: RE | Admit: 2020-01-26 | Discharge: 2020-01-26 | Disposition: A | Discharge status: Home / Self Care | Payer: Medicaid Other | Source: Ambulatory Visit | Attending: Adult Health | Admitting: Adult Health

## 2020-01-26 ENCOUNTER — Telehealth: Payer: Self-pay | Admitting: Adult Health

## 2020-01-26 ENCOUNTER — Ambulatory Visit
Admission: RE | Admit: 2020-01-26 | Discharge: 2020-01-26 | Disposition: A | Discharge status: Home / Self Care | Payer: Medicaid Other | Source: Ambulatory Visit | Attending: Adult Health | Admitting: Adult Health

## 2020-01-26 ENCOUNTER — Encounter: Payer: Self-pay | Admitting: Adult Health

## 2020-01-26 ENCOUNTER — Other Ambulatory Visit: Payer: Self-pay

## 2020-01-26 VITALS — BP 140/80 | HR 83 | Temp 96.8°F | Resp 16 | Ht 64.0 in | Wt 161.4 lb

## 2020-01-26 DIAGNOSIS — Z0000 Encounter for general adult medical examination without abnormal findings: Secondary | ICD-10-CM | POA: Diagnosis not present

## 2020-01-26 DIAGNOSIS — F418 Other specified anxiety disorders: Secondary | ICD-10-CM | POA: Diagnosis not present

## 2020-01-26 DIAGNOSIS — R062 Wheezing: Secondary | ICD-10-CM | POA: Insufficient documentation

## 2020-01-26 DIAGNOSIS — J069 Acute upper respiratory infection, unspecified: Secondary | ICD-10-CM | POA: Insufficient documentation

## 2020-01-26 DIAGNOSIS — E039 Hypothyroidism, unspecified: Secondary | ICD-10-CM

## 2020-01-26 DIAGNOSIS — R509 Fever, unspecified: Secondary | ICD-10-CM | POA: Diagnosis present

## 2020-01-26 DIAGNOSIS — F439 Reaction to severe stress, unspecified: Secondary | ICD-10-CM

## 2020-01-26 DIAGNOSIS — F419 Anxiety disorder, unspecified: Secondary | ICD-10-CM | POA: Insufficient documentation

## 2020-01-26 DIAGNOSIS — Z20828 Contact with and (suspected) exposure to other viral communicable diseases: Secondary | ICD-10-CM | POA: Insufficient documentation

## 2020-01-26 DIAGNOSIS — Z20822 Contact with and (suspected) exposure to covid-19: Secondary | ICD-10-CM

## 2020-01-26 DIAGNOSIS — F329 Major depressive disorder, single episode, unspecified: Secondary | ICD-10-CM | POA: Insufficient documentation

## 2020-01-26 DIAGNOSIS — F32A Depression, unspecified: Secondary | ICD-10-CM | POA: Insufficient documentation

## 2020-01-26 DIAGNOSIS — Z8639 Personal history of other endocrine, nutritional and metabolic disease: Secondary | ICD-10-CM | POA: Insufficient documentation

## 2020-01-26 DIAGNOSIS — Z309 Encounter for contraceptive management, unspecified: Secondary | ICD-10-CM | POA: Insufficient documentation

## 2020-01-26 LAB — COMPREHENSIVE METABOLIC PANEL
ALT: 24 U/L (ref 0–44)
AST: 14 U/L — ABNORMAL LOW (ref 15–41)
Albumin: 4.9 g/dL (ref 3.5–5.0)
Alkaline Phosphatase: 71 U/L (ref 38–126)
Anion gap: 10 (ref 5–15)
BUN: 10 mg/dL (ref 6–20)
CO2: 21 mmol/L — ABNORMAL LOW (ref 22–32)
Calcium: 9.6 mg/dL (ref 8.9–10.3)
Chloride: 107 mmol/L (ref 98–111)
Creatinine, Ser: 0.69 mg/dL (ref 0.44–1.00)
GFR calc Af Amer: >60 mL/min (ref >60)
GFR calc non Af Amer: >60 mL/min (ref >60)
Glucose, Bld: 99 mg/dL (ref 70–99)
Potassium: 4.3 mmol/L (ref 3.5–5.1)
Sodium: 138 mmol/L (ref 135–145)
Total Bilirubin: 0.5 mg/dL (ref 0.3–1.2)
Total Protein: 8.2 g/dL — ABNORMAL HIGH (ref 6.5–8.1)

## 2020-01-26 LAB — CBC WITH DIFFERENTIAL/PLATELET
Abs Immature Granulocytes: 0.03 10*3/uL (ref 0.00–0.07)
Basophils Absolute: 0 10*3/uL (ref 0.0–0.1)
Basophils Relative: 1 %
Eosinophils Absolute: 0.1 10*3/uL (ref 0.0–0.5)
Eosinophils Relative: 1 %
HCT: 42.8 % (ref 36.0–46.0)
Hemoglobin: 13.9 g/dL (ref 12.0–15.0)
Immature Granulocytes: 1 %
Lymphocytes Relative: 41 %
Lymphs Abs: 2.7 10*3/uL (ref 0.7–4.0)
MCH: 28.3 pg (ref 26.0–34.0)
MCHC: 32.5 g/dL (ref 30.0–36.0)
MCV: 87 fL (ref 80.0–100.0)
Monocytes Absolute: 0.4 10*3/uL (ref 0.1–1.0)
Monocytes Relative: 6 %
Neutro Abs: 3.2 10*3/uL (ref 1.7–7.7)
Neutrophils Relative %: 50 %
Platelets: 268 10*3/uL (ref 150–400)
RBC: 4.92 MIL/uL (ref 3.87–5.11)
RDW: 11.9 % (ref 11.5–15.5)
WBC: 6.4 10*3/uL (ref 4.0–10.5)
nRBC: 0 % (ref 0.0–0.2)

## 2020-01-26 LAB — POCT URINALYSIS DIPSTICK
Bilirubin, UA: NEGATIVE
Blood, UA: NEGATIVE
Glucose, UA: NEGATIVE
Ketones, UA: NEGATIVE
Leukocytes, UA: NEGATIVE
Nitrite, UA: NEGATIVE
Protein, UA: NEGATIVE
Spec Grav, UA: <=1.005 — AB (ref 1.010–1.025)
Urobilinogen, UA: 0.2 E.U./dL
pH, UA: 7 (ref 5.0–8.0)

## 2020-01-26 LAB — LIPID PANEL
Cholesterol: 200 mg/dL (ref 0–200)
HDL: 39 mg/dL — ABNORMAL LOW (ref >40)
LDL Cholesterol: 147 mg/dL — ABNORMAL HIGH (ref 0–99)
Total CHOL/HDL Ratio: 5.1 RATIO
Triglycerides: 71 mg/dL (ref <150)
VLDL: 14 mg/dL (ref 0–40)

## 2020-01-26 LAB — VITAMIN D 25 HYDROXY (VIT D DEFICIENCY, FRACTURES): Vit D, 25-Hydroxy: 21.09 ng/mL — ABNORMAL LOW (ref 30–100)

## 2020-01-26 LAB — TSH: TSH: 7.271 u[IU]/mL — ABNORMAL HIGH (ref 0.350–4.500)

## 2020-01-26 MED ORDER — LEVOTHYROXINE SODIUM 50 MCG PO TABS: 50.0000 ug | ORAL_TABLET | Freq: Every day | ORAL | 0 refills | Status: DC

## 2020-01-26 MED ORDER — ESCITALOPRAM OXALATE 10 MG PO TABS: 10.0000 mg | ORAL_TABLET | Freq: Every day | ORAL | 0 refills | Status: DC

## 2020-01-26 MED ORDER — PREDNISONE 10 MG (21) PO TBPK: ORAL_TABLET | ORAL | 0 refills | Status: DC

## 2020-01-26 MED ORDER — ALBUTEROL SULFATE HFA 108 (90 BASE) MCG/ACT IN AERS: 1.0000 | INHALATION_SPRAY | Freq: Four times a day (QID) | RESPIRATORY_TRACT | 0 refills | Status: DC | PRN

## 2020-01-26 NOTE — Telephone Encounter (Signed)
Patient has been advised and states that she has reviewed over labs in [place]. KW

## 2020-01-26 NOTE — Telephone Encounter (Signed)
Meds ordered this encounter  ?Medications  ? levothyroxine (SYNTHROID) 50 MCG tablet  ?  Sig: Take 1 tablet (50 mcg total) by mouth daily.  ?  Dispense:  90 tablet  ?  Refill:  0  ? ? ?

## 2020-01-26 NOTE — Progress Notes (Signed)
Sent message to patient regarding low vitamin D level.

## 2020-01-26 NOTE — Patient Instructions (Addendum)
Psychiatric/Counseling Resources Discussed As Follows:  If Emergency please seek Emergency Room Care Immediately or Call 911.   [facility] Address:  [address] [place], [place] [address] Phone: [phone] Website : [email or web address]   [facility] Address:  [address] [place], [place] [address] Phone: [phone] Fax: [phone] Website: [email or web address] How To Access Our Services Because our main goal is to meet the needs of our consumers, [facility] operates on a walk-in basis! To access services, there are just 3 easy steps: 1) Walk in any Monday, Wednesday or Friday between 8:00 am and 3:00 pm and complete our consumer paperwork 2) A Comprehensive Clinical Assessment (CCA) will be completed and appropriate service recommendations will be provided 3) Recommendations are sent to [facility] team members and the appropriate staff will call you within days. Advanced Access Open M - F, 8:00 am - 8:00 pm  Mental health crisis services for all age groups  Triage  Psychiatric Evaluations  Involuntary Commitments  [facility]  Address: [address] [place], [place] [address] Website : [email or web address] Walk in's accepted see web site or call for more information Phone : [phone] Also has [facility] [phone]  Psychology Today Find a therapist by searching online in your area or specialist by your diagnosis Website:  [email or web address]  Covid Testing Instructions for [place]:  If you/your practice has a patient that needs an appointment, please have the patient text "COVID" to [phone], OR they can log on to [email or web address] to easily make an on-line appointment. Please note:  If you have a patient who does not have access to a smart phone or PC, you or the patient can call [phone] to get assistance.   Escitalopram  tablets What is this medicine? ESCITALOPRAM (es sye TAL oh pram) is used to treat depression and certain types of anxiety. This medicine may be used for other purposes; ask your health care provider or pharmacist if you have questions. COMMON BRAND NAME(S): Lexapro What should I tell my health care provider before I take this medicine? They need to know if you have any of these conditions:  bipolar disorder or a family history of bipolar disorder  diabetes  glaucoma  heart disease  kidney or liver disease  receiving electroconvulsive therapy  seizures (convulsions)  suicidal thoughts, plans, or attempt by you or a family member  an unusual or allergic reaction to escitalopram, the related drug citalopram, other medicines, foods, dyes, or preservatives  pregnant or trying to become pregnant  breast-feeding How should I use this medicine? Take this medicine by mouth with a glass of water. Follow the directions on the prescription label. You can take it with or without food. If it upsets your stomach, take it with food. Take your medicine at regular intervals. Do not take it more often than directed. Do not stop taking this medicine suddenly except upon the advice of your doctor. Stopping this medicine too quickly may cause serious side effects or your condition may worsen. A special MedGuide will be given to you by the pharmacist with each prescription and refill. Be sure to read this information carefully each time. Talk to your pediatrician regarding the use of this medicine in children. Special care may be needed. Overdosage: If you think you have taken too much of this medicine contact a poison control center or emergency room at once. NOTE: This medicine is only for you. Do not share this medicine with others. What if  I miss a dose? If you miss a dose, take it as soon as you can. If it is almost time for your next dose, take only that dose. Do not take double or extra  doses. What may interact with this medicine? Do not take this medicine with any of the following medications:  certain medicines for fungal infections like fluconazole, itraconazole, ketoconazole, posaconazole, voriconazole  cisapride  citalopram  dronedarone  linezolid  MAOIs like Carbex, Eldepryl, Marplan, Nardil, and Parnate  methylene blue (injected into a vein)  pimozide  thioridazine This medicine may also interact with the following medications:  alcohol  amphetamines  aspirin and aspirin-like medicines  carbamazepine  certain medicines for depression, anxiety, or psychotic disturbances  certain medicines for migraine headache like almotriptan, eletriptan, frovatriptan, naratriptan, rizatriptan, sumatriptan, zolmitriptan  certain medicines for sleep  certain medicines that treat or prevent blood clots like warfarin, enoxaparin, dalteparin  cimetidine  diuretics  dofetilide  fentanyl  furazolidone  isoniazid  lithium  metoprolol  NSAIDs, medicines for pain and inflammation, like ibuprofen or naproxen  other medicines that prolong the QT interval (cause an abnormal heart rhythm)  procarbazine  rasagiline  supplements like St. John's wort, kava kava, valerian  tramadol  tryptophan  ziprasidone This list may not describe all possible interactions. Give your health care provider a list of all the medicines, herbs, non-prescription drugs, or dietary supplements you use. Also tell them if you smoke, drink alcohol, or use illegal drugs. Some items may interact with your medicine. What should I watch for while using this medicine? Tell your doctor if your symptoms do not get better or if they get worse. Visit your doctor or health care professional for regular checks on your progress. Because it may take several weeks to see the full effects of this medicine, it is important to continue your treatment as prescribed by your doctor. Patients and  their families should watch out for new or worsening thoughts of suicide or depression. Also watch out for sudden changes in feelings such as feeling anxious, agitated, panicky, irritable, hostile, aggressive, impulsive, severely restless, overly excited and hyperactive, or not being able to sleep. If this happens, especially at the beginning of treatment or after a change in dose, call your health care professional. Dennis Bast may get drowsy or dizzy. Do not drive, use machinery, or do anything that needs mental alertness until you know how this medicine affects you. Do not stand or sit up quickly, especially if you are an older patient. This reduces the risk of dizzy or fainting spells. Alcohol may interfere with the effect of this medicine. Avoid alcoholic drinks. Your mouth may get dry. Chewing sugarless gum or sucking hard candy, and drinking plenty of water may help. Contact your doctor if the problem does not go away or is severe. What side effects may I notice from receiving this medicine? Side effects that you should report to your doctor or health care professional as soon as possible:  allergic reactions like skin rash, itching or hives, swelling of the face, lips, or tongue  anxious  black, tarry stools  changes in vision  confusion  elevated mood, decreased need for sleep, racing thoughts, impulsive behavior  eye pain  fast, irregular heartbeat  feeling faint or lightheaded, falls  feeling agitated, angry, or irritable  hallucination, loss of contact with reality  loss of balance or coordination  loss of memory  painful or prolonged erections  restlessness, pacing, inability to keep still  seizures  stiff muscles  suicidal thoughts or other mood changes  trouble sleeping  unusual bleeding or bruising  unusually weak or tired  vomiting Side effects that usually do not require medical attention (report to your doctor or health care professional if they continue or  are bothersome):  changes in appetite  change in sex drive or performance  headache  increased sweating  indigestion, nausea  tremors This list may not describe all possible side effects. Call your doctor for medical advice about side effects. You may report side effects to FDA at 1-800-FDA-1088. Where should I keep my medicine? Keep out of reach of children. Store at room temperature between 15 and 30 degrees C (59 and 86 degrees F). Throw away any unused medicine after the expiration date. NOTE: This sheet is a summary. It may not cover all possible information. If you have questions about this medicine, talk to your doctor, pharmacist, or health care provider.  2020 Elsevier/Gold Standard (2018-12-08 11:21:44)  Managing Anxiety, Adult After being diagnosed with an anxiety disorder, you may be relieved to know why you have felt or behaved a certain way. You may also feel overwhelmed about the treatment ahead and what it will mean for your life. With care and support, you can manage this condition and recover from it. How to manage lifestyle changes Managing stress and anxiety  Stress is your body's reaction to life changes and events, both good and bad. Most stress will last just a few hours, but stress can be ongoing and can lead to more than just stress. Although stress can play a major role in anxiety, it is not the same as anxiety. Stress is usually caused by something external, such as a deadline, test, or competition. Stress normally passes after the triggering event has ended.  Anxiety is caused by something internal, such as imagining a terrible outcome or worrying that something will go wrong that will devastate you. Anxiety often does not go away even after the triggering event is over, and it can become long-term (chronic) worry. It is important to understand the differences between stress and anxiety and to manage your stress effectively so that it does not lead to an anxious  response. Talk with your health care provider or a counselor to learn more about reducing anxiety and stress. He or she may suggest tension reduction techniques, such as:  Music therapy. This can include creating or listening to music that you enjoy and that inspires you.  Mindfulness-based meditation. This involves being aware of your normal breaths while not trying to control your breathing. It can be done while sitting or walking.  Centering prayer. This involves focusing on a word, phrase, or sacred image that means something to you and brings you peace.  Deep breathing. To do this, expand your stomach and inhale slowly through your nose. Hold your breath for 3-5 seconds. Then exhale slowly, letting your stomach muscles relax.  Self-talk. This involves identifying thought patterns that lead to anxiety reactions and changing those patterns.  Muscle relaxation. This involves tensing muscles and then relaxing them. Choose a tension reduction technique that suits your lifestyle and personality. These techniques take time and practice. Set aside 5-15 minutes a day to do them. Therapists can offer counseling and training in these techniques. The training to help with anxiety may be covered by some insurance plans. Other things you can do to manage stress and anxiety include:  Keeping a stress/anxiety diary. This can help you learn what triggers your  reaction and then learn ways to manage your response.  Thinking about how you react to certain situations. You may not be able to control everything, but you can control your response.  Making time for activities that help you relax and not feeling guilty about spending your time in this way.  Visual imagery and yoga can help you stay calm and relax.  Medicines Medicines can help ease symptoms. Medicines for anxiety include:  Anti-anxiety drugs.  Antidepressants. Medicines are often used as a primary treatment for anxiety disorder. Medicines  will be prescribed by a health care provider. When used together, medicines, psychotherapy, and tension reduction techniques may be the most effective treatment. Relationships Relationships can play a big part in helping you recover. Try to spend more time connecting with trusted friends and family members. Consider going to couples counseling, taking family education classes, or going to family therapy. Therapy can help you and others better understand your condition. How to recognize changes in your anxiety Everyone responds differently to treatment for anxiety. Recovery from anxiety happens when symptoms decrease and stop interfering with your daily activities at home or work. This may mean that you will start to:  Have better concentration and focus. Worry will interfere less in your daily thinking.  Sleep better.  Be less irritable.  Have more energy.  Have improved memory. It is important to recognize when your condition is getting worse. Contact your health care provider if your symptoms interfere with home or work and you feel like your condition is not improving. Follow these instructions at home: Activity  Exercise. Most adults should do the following: ? Exercise for at least 150 minutes each week. The exercise should increase your heart rate and make you sweat (moderate-intensity exercise). ? Strengthening exercises at least twice a week.  Get the right amount and quality of sleep. Most adults need 7-9 hours of sleep each night. Lifestyle   Eat a healthy diet that includes plenty of vegetables, fruits, whole grains, low-fat dairy products, and lean protein. Do not eat a lot of foods that are high in solid fats, added sugars, or salt.  Make choices that simplify your life.  Do not use any products that contain nicotine or tobacco, such as cigarettes, e-cigarettes, and chewing tobacco. If you need help quitting, ask your health care provider.  Avoid caffeine, alcohol, and  certain over-the-counter cold medicines. These may make you feel worse. Ask your pharmacist which medicines to avoid. General instructions  Take over-the-counter and prescription medicines only as told by your health care provider.  Keep all follow-up visits as told by your health care provider. This is important. Where to find support You can get help and support from these sources:  Self-help groups.  Online and [employer]community organizations.  A trusted spiritual leader.  Couples counseling.  Family education classes.  Family therapy. Where to find more information You may find that joining a support group helps you deal with your anxiety. The following sources can help you locate counselors or support groups near you:  Mental Health America: www.mentalhealthamerica.net  Anxiety and Depression Association of [place] (ADAA): ProgramCam.dewww.adaa.org  The [employer] Alliance on Mental Illness (NAMI): www.nami.org Contact a health care provider if you:  Have a hard time staying focused or finishing daily tasks.  Spend many hours a day feeling worried about everyday life.  Become exhausted by worry.  Start to have headaches, feel tense, or have nausea.  Urinate more than normal.  Have diarrhea. Get help right away  if you have:  A racing heart and shortness of breath.  Thoughts of hurting yourself or others. If you ever feel like you may hurt yourself or others, or have thoughts about taking your own life, get help right away. You can go to your nearest emergency department or call:  Your local emergency services (911 in the [place]  A suicide crisis helpline, such as the National Suicide Prevention Lifeline at 586-555-8928. This is open 24 hours a day. Summary  Taking steps to learn and use tension reduction techniques can help calm you and help prevent triggering an anxiety reaction.  When used together, medicines, psychotherapy, and tension reduction techniques may be the most  effective treatment.  Family, friends, and partners can play a big part in helping you recover from an anxiety disorder. This information is not intended to replace advice given to you by your health care provider. Make sure you discuss any questions you have with your health care provider. Document Revised: 05/19/2019 Document Reviewed: 05/19/2019 Elsevier Patient Education  2020 [employer].

## 2020-01-26 NOTE — Progress Notes (Signed)
Patient needs to start Synthroid for hypothyroidism as lab shows elevation 7.271. I will send in Synthroid 50 mcg to take po qd. ( she previously took ),   CBC is within normal, still no signs of infection of the blood, no anemia.   Minor irregularities with CMP, no significant concern, and likely related to her current viral infection. Will recheck in 1-2 months. Chest x ray is normal and reassuring. Sent for Covid testing today and given prednisone and inhaler for wheezing/ chest tightness.  LDL elevated.  Discuss lifestyle modification with patient e.g. increase exercise, fiber, fruits, vegetables, lean meat, and omega 3/fish intake and decrease saturated fat.   Follow up as discussed at this mornings visit and sooner if needed.

## 2020-01-27 ENCOUNTER — Encounter: Payer: Self-pay | Admitting: Adult Health

## 2020-01-27 LAB — NOVEL CORONAVIRUS, NAA: SARS-CoV-2, NAA: NOT DETECTED

## 2020-01-28 ENCOUNTER — Other Ambulatory Visit: Payer: Self-pay | Admitting: Adult Health

## 2020-01-28 DIAGNOSIS — E559 Vitamin D deficiency, unspecified: Secondary | ICD-10-CM

## 2020-01-28 MED ORDER — VITAMIN D (ERGOCALCIFEROL) 1.25 MG (50000 UNIT) PO CAPS: 50000.0000 [IU] | ORAL_CAPSULE | ORAL | 0 refills | Status: DC

## 2020-02-03 ENCOUNTER — Encounter: Payer: Self-pay | Admitting: Adult Health

## 2020-02-05 ENCOUNTER — Telehealth: Payer: Self-pay

## 2020-02-05 NOTE — Telephone Encounter (Signed)
Flinchum, Eula Fried, FNP  Fonda Kinder, CMA  If you will follow up with her, she can come to office for follow up evaluation for these new symptoms. We can get her to send records from [facility] office and schedule pap, pregnancy test and Depo if she is due. She did covid test last day we seen her I believe.

## 2020-02-05 NOTE — Telephone Encounter (Signed)
Attempted to contact patient with no answer. KW

## 2020-02-05 NOTE — Telephone Encounter (Signed)
Attempted to reach patient but no voicemailbox has been set up yet. KW

## 2020-02-08 NOTE — Telephone Encounter (Signed)
Patient was advised and declined x-ray at this time, patient stats that she believes her medication is causing her to be constipated. KW

## 2020-02-08 NOTE — Telephone Encounter (Signed)
You can place order for DG abdominal x Ray - flat plate and I will look for results if she would like to go.   If no other symptoms related to the constipation such  as abdominal pain or bleeding she can try Milk of Magnesia 30 ml once today.  I recommend her adding in Metamucil 1 tablespoon mixed with eight ounces of water up to three times daily- I recommend he Metamucil as directed three times daily until she has normal bowe l movements. She can then decrease to once daily. Start Colace per package instructions. Likely related to her hypothyroidism, diet, or not enough fluid intake. Should be doing clear fluids and avoid constipating food currently. Drinking Warm prune juice, apple cider are good to try as well as increasing fiber rich foods in diet. If any pain or worsening symptoms be seen immediately. Keep follow up for tomorrow.

## 2020-02-08 NOTE — Telephone Encounter (Signed)
Called and spoke with patient on the phone who reports that she has been having issues with constipation for over  Ten days and states that she has been passing only one small bowel movement a week. Patient states that she has been taking otc Miralax with no relief. Patient says she has a virtual appointment scheduled with you this Wednesday and will address with you at appointment. KW

## 2020-02-10 ENCOUNTER — Telehealth: Payer: Medicaid Other | Admitting: Adult Health

## 2020-02-11 NOTE — Telephone Encounter (Signed)
Unable to reach patient because voicemail box is not set up yet, if patient calls back okay for [facility] triage to advise patient of message below. KW

## 2020-02-11 NOTE — Telephone Encounter (Signed)
Yes the Lexapro she is on can cause constipation in some people however most people experience diarrhea with it. I do still suspect the hypothyroidism. Treatment recommendation of diet/ exercise/ increased fiber and fiber supplement and medications as advised. If that does not resolve constipation or if any worsening symptoms or pain, fever, nausea, vomiting, bleeding she would need evaluation.

## 2020-02-11 NOTE — Telephone Encounter (Signed)
Unable to reach patient because voicemail bo

## 2020-02-11 NOTE — Telephone Encounter (Signed)
Attempted to call patient but no answer at this time. Unable to leave message on voicemail due to voicemail box not being set up.

## 2020-02-12 ENCOUNTER — Other Ambulatory Visit: Payer: Self-pay | Admitting: Adult Health

## 2020-02-12 ENCOUNTER — Encounter: Payer: Self-pay | Admitting: Adult Health

## 2020-02-22 ENCOUNTER — Encounter: Payer: Self-pay | Admitting: Adult Health

## 2020-02-25 NOTE — Progress Notes (Signed)
Patient: Carolyn Bennett Female    DOB: Aug 25, 1991   29 y.o.   MRN: [id number] Visit Date: 02/26/2020  Today's Provider: Marcille Buffy, FNP   Chief Complaint  Patient presents with  . Depression  . Anxiety   Subjective:     HPI  Follow up for Depression & Anxiety  The patient was last seen for this 1 months ago. Changes made at last visit include patient was started on Lexapro 67m qd and was referred to psychiatry .  She reports poor compliance with treatment. Patient reports that she discontinued Lexapro due to headache and not sleeping.  She feels that condition is Unchanged. She is having side effects.Patient reports she had difficulty sleeping, headaches, nausea and increased anxiety.  She is sleeping better, night sweats are gone. Her respiratory symptoms got better, chest pain has resolved.  Feeling better on Synthroid that was restarted one month ago.   Patient  denies any fever, body aches,chills, rash, chest pain, shortness of breath, nausea, vomiting, or diarrhea.   She wants a Depo shot today,. Last was in November 21 2019. She was on it for several years. She is taking Vitamin D.  Patient  denies any fever, body aches,chills, rash, chest pain, shortness of breath, nausea, vomiting, or diarrhea.     2017 PAP results  Rapid HIV (11/27/2016 9:37 AM) Rapid HIV (11/27/2016 9:37 AM)  Component Value Ref Range  HIV 1/2 Antibodies Negative Negative  HIV 1 P24 Antigen Negative Negative   Rapid HIV (11/27/2016 9:37 AM)  Specimen Performing Laboratory  [place] [id number]   Rapid HIV (11/27/2016 9:37 AM)  Narrative    This test has not been validated on patients under 157years of age.    This test has not been validated on patients under 193years of age.   Back to top of Lab Results    Pap IG, Ct-Ng, rfx HPV ASCU - [facility] (11/27/2016 9:17 AM) Pap IG, Ct-Ng, rfx HPV  ASCU - [facility] (11/27/2016 9:17 AM)  Component Value Ref Range  DIAGNOSIS: - [facility] CommentComment: NEGATIVE FOR INTRAEPITHELIAL LESION AND MALIGNANCY.   Specimen adequacy: - [facility] Comment Comment:  Satisfactory for evaluation. Endocervical and/or squamous metaplastic cells (endocervical component) are present.   Clinician provided ICD10: - [facility] CommentComment: Z34.02   PERFORMED BY: - [facility] CommentComment: LCollene Schlichter Cytotechnologist (ASCP)   . - [facility] .   Note: - [facility] Comment Comment:  The Pap smear is a screening test designed to aid in the detection of premalignant and malignant conditions of the uterine cervix.It is not a diagnostic procedure and should not be used as the sole means of detecting cervical cancer.Both false-positive and false-negative reports do occur.   Test Methodology: - [facility] Comment Comment:  This liquid based ThinPrep(R) pap test was screened with the use of an image guided system.   . - [facility] Comment Comment:  The HPV DNA reflex criteria were not met with this specimen result therefore, no HPV testing was performed.   Chlamydia, Nuc. Acid Amp - [facility] Negative Negative  Gonococcus, Nuc. Acid Amp - [facility] Negative Negative      -----------------------------------------------  No Known Allergies   Current Outpatient Medications:  .  albuterol (VENTOLIN HFA) 108 (90 Base) MCG/ACT inhaler, Inhale 1-2 puffs into the lungs every 6 (six) hours as needed for wheezing or shortness of breath., Disp: 18 g, Rfl: 0 .  levothyroxine (SYNTHROID) 50 MCG tablet, Take 1 tablet (50 mcg total) by mouth daily., Disp: 90 tablet, Rfl: 0 .  medroxyPROGESTERone (DEPO-PROVERA) 150 MG/ML injection, Inject 1 mL (150 mg total) into the muscle every 3 (three) months., Disp: 1 mL, Rfl: 3 .  Vitamin D, Ergocalciferol, (DRISDOL) 1.25 MG (50000 UNIT) CAPS capsule, Take 1 capsule (50,000 Units total) by mouth every 7 (seven) days., Disp: 8 capsule,  Rfl: 0  Current Facility-Administered Medications:  .  medroxyPROGESTERone (DEPO-PROVERA) injection 150 mg, 150 mg, Intramuscular, Once, [place], Kelby Aline, FNP  Review of Systems  Constitutional: Negative.   HENT: Negative.   Respiratory: Negative.   Cardiovascular: Negative.   Gastrointestinal: Negative.   Genitourinary: Negative.  Negative for decreased urine volume, difficulty urinating, dyspareunia, dysuria, enuresis, flank pain, frequency, genital sores, hematuria, menstrual problem, pelvic pain, urgency, vaginal bleeding, vaginal discharge and vaginal pain.  Musculoskeletal: Negative.   Neurological: Negative.   Hematological: Negative.   Psychiatric/Behavioral: Positive for sleep disturbance and suicidal ideas. Negative for agitation, behavioral problems, confusion, decreased concentration, dysphoric mood, hallucinations and self-injury. The patient is nervous/anxious. The patient is not hyperactive.     Social History   Tobacco Use  . Smoking status: Former Research scientist (life sciences)  . Smokeless tobacco: Never Used  Substance Use Topics  . Alcohol use: No      Objective:   BP 110/80   Pulse 93   Temp (!) 96.9 F (36.1 C) (Oral)   Resp 16   Wt 162 lb 12.8 oz (73.8 kg)   LMP  (Within Months) Comment: patient currently on Depo she states last LMP was 10/2019  SpO2 99%   BMI 27.94 kg/m  Vitals:   02/26/20 0925  BP: 110/80  Pulse: 93  Resp: 16  Temp: (!) 96.9 F (36.1 C)  TempSrc: Oral  SpO2: 99%  Weight: 162 lb 12.8 oz (73.8 kg)  Body mass index is 27.94 kg/m.   Physical Exam Vitals reviewed. Exam conducted with a chaperone present.  Constitutional:      General: She is not in acute distress.    Appearance: Normal appearance. She is well-developed. She is not ill-appearing, toxic-appearing or diaphoretic.     Interventions: She is not intubated. HENT:     Head: Normocephalic and atraumatic.     Right Ear: External ear normal.     Left Ear: External ear normal.      Nose: Nose normal.     Mouth/Throat:     Mouth: Mucous membranes are moist.  Eyes:     General: Lids are normal. No scleral icterus.       Right eye: No discharge.        Left eye: No discharge.     Conjunctiva/sclera: Conjunctivae normal.     Right eye: Right conjunctiva is not injected. No exudate or hemorrhage.    Left eye: Left conjunctiva is not injected. No exudate or hemorrhage.    Pupils: Pupils are equal, round, and reactive to light.  Neck:     Thyroid: No thyroid mass or thyromegaly.     Vascular: Normal carotid pulses. No carotid bruit, hepatojugular reflux or JVD.     Trachea: Trachea and phonation normal. No tracheal tenderness or tracheal deviation.     Meningeal: Brudzinski's sign and Kernig's sign absent.  Cardiovascular:     Rate and Rhythm: Normal rate and regular rhythm.     Pulses: Normal pulses.          Radial pulses are 2+ on the right  side and 2+ on the left side.       Dorsalis pedis pulses are 2+ on the right side and 2+ on the left side.       Posterior tibial pulses are 2+ on the right side and 2+ on the left side.     Heart sounds: Normal heart sounds, S1 normal and S2 normal. Heart sounds not distant. No murmur. No friction rub. No gallop.   Pulmonary:     Effort: Pulmonary effort is normal. No tachypnea, bradypnea, accessory muscle usage or respiratory distress. She is not intubated.     Breath sounds: Normal breath sounds. No stridor. No wheezing or rales.  Chest:     Chest wall: No tenderness.  Abdominal:     General: Bowel sounds are normal. There is no distension or abdominal bruit.     Palpations: Abdomen is soft. There is no shifting dullness, fluid wave, hepatomegaly, splenomegaly, mass or pulsatile mass.     Tenderness: There is no abdominal tenderness. There is no right CVA tenderness, left CVA tenderness, guarding or rebound.     Hernia: No hernia is present. There is no hernia in the left inguinal area or right inguinal area.   Genitourinary:    Pubic Area: No rash or pubic lice.      Tanner stage (genital): 5.     Labia:        Right: No rash, tenderness, lesion or injury.        Left: No rash, tenderness, lesion or injury.      Urethra: No prolapse, urethral pain, urethral swelling or urethral lesion.     Vagina: No signs of injury and foreign body. Vaginal discharge (white scant ) present. No erythema, tenderness, bleeding, lesions or prolapsed vaginal walls.     Cervix: No cervical motion tenderness, discharge, friability, lesion, erythema, cervical bleeding or eversion.     Uterus: Normal. Not deviated, not enlarged, not fixed, not tender and no uterine prolapse.      Adnexa: Right adnexa normal and left adnexa normal.       Right: No mass, tenderness or fullness.         Left: No mass, tenderness or fullness.       Rectum: Normal.  Musculoskeletal:        General: No tenderness or deformity. Normal range of motion.     Cervical back: Full passive range of motion without pain, normal range of motion and neck supple. No edema, erythema or rigidity. No spinous process tenderness or muscular tenderness. Normal range of motion.  Lymphadenopathy:     Head:     Right side of head: No submental, submandibular, tonsillar, preauricular, posterior auricular or occipital adenopathy.     Left side of head: No submental, submandibular, tonsillar, preauricular, posterior auricular or occipital adenopathy.     Cervical: No cervical adenopathy.     Right cervical: No superficial, deep or posterior cervical adenopathy.    Left cervical: No superficial, deep or posterior cervical adenopathy.     Upper Body:     Right upper body: No supraclavicular or pectoral adenopathy.     Left upper body: No supraclavicular or pectoral adenopathy.     Lower Body: No right inguinal adenopathy. No left inguinal adenopathy.  Skin:    General: Skin is warm and dry.     Capillary Refill: Capillary refill takes less than 2 seconds.      Coloration: Skin is not pale.     Findings: No  abrasion, bruising, burn, ecchymosis, erythema, lesion, petechiae or rash.     Nails: There is no clubbing.  Neurological:     Mental Status: She is alert and oriented to person, place, and time.     GCS: GCS eye subscore is 4. GCS verbal subscore is 5. GCS motor subscore is 6.     Cranial Nerves: No cranial nerve deficit.     Sensory: No sensory deficit.     Motor: No weakness, tremor, atrophy, abnormal muscle tone or seizure activity.     Coordination: Coordination normal.     Gait: Gait normal.     Deep Tendon Reflexes: Reflexes are normal and symmetric. Reflexes normal. Babinski sign absent on the right side. Babinski sign absent on the left side.     Reflex Scores:      Tricep reflexes are 2+ on the right side and 2+ on the left side.      Bicep reflexes are 2+ on the right side and 2+ on the left side.      Brachioradialis reflexes are 2+ on the right side and 2+ on the left side.      Patellar reflexes are 2+ on the right side and 2+ on the left side.      Achilles reflexes are 2+ on the right side and 2+ on the left side. Psychiatric:        Mood and Affect: Mood normal.        Speech: Speech normal.        Behavior: Behavior normal.        Thought Content: Thought content normal.        Judgment: Judgment normal.    Depression screen [facility] 2/9 02/26/2020 01/26/2020  Decreased Interest 1 2  Down, Depressed, Hopeless 1 2  PHQ - 2 Score 2 4  Altered sleeping 2 2  Tired, decreased energy 1 2  Change in appetite 1 0  Feeling bad or failure about yourself  1 0  Trouble concentrating 1 0  Moving slowly or fidgety/restless 0 0  Suicidal thoughts 0 0  PHQ-9 Score 8 8  Difficult doing work/chores Not difficult at all Very difficult    GAD 7 : Generalized Anxiety Score 02/26/2020 01/26/2020  Nervous, Anxious, on Edge 1 3  Control/stop worrying 1 3  Worry too much - different things 1 3  Trouble relaxing 2 3  Restless 1 3  Easily  annoyed or irritable 2 3  Afraid - awful might happen 1 3  Total GAD 7 Score 9 21  Anxiety Difficulty Somewhat difficult Very difficult      No results found for any visits on 02/26/20.     Assessment & Plan    Encounter for surveillance of injectable contraceptive - Plan: medroxyPROGESTERone (DEPO-PROVERA) injection 150 mg, POCT urine pregnancy  Screening for cervical cancer - Plan: Cytology - PAP  Vitamin D deficiency - Plan: VITAMIN D 25 Hydroxy (Vit-D Deficiency, Fractures)  Hypothyroidism, unspecified type - Plan: TSH  Elevated liver enzymes - Plan: Comprehensive Metabolic Panel (CMET)   She feels she is doing much better with her anxiety, her thyroid medication and she is now back on, she will come back for a repeat TSH in 2 to 3 weeks.  We will call her with that and then advise a follow-up appointment for that for her. Is aware that she can return the office sooner she needs to to give [place] a call.  We will call her with Pap results. Advised  patient call the office or your primary care doctor for an appointment if no improvement within 72 hours or if any symptoms change or worsen at any time  Advised ER or urgent Care if after hours or on weekend. Call 911 for emergency symptoms at any time.Patinet verbalized understanding of all instructions given/reviewed and treatment plan and has no further questions or concerns at this time.    Return in about 3 months (around 05/25/2020), or if symptoms worsen or fail to improve, for at any time for any worsening symptoms, Go to Emergency room/ urgent care if worse.    The entirety of the information documented in the History of Present Illness, Review of Systems and Physical Exam were personally obtained by me. Portions of this information were initially documented by the  Certified Medical Assistant whose name is documented in [place] and reviewed by me for thoroughness and accuracy.  I have personally performed the exam and reviewed the  chart and it is accurate to the best of my knowledge.  Haematologist has been used and any errors in dictation or transcription are unintentional.  Kelby Aline. [place], [facility]

## 2020-02-26 ENCOUNTER — Ambulatory Visit (INDEPENDENT_AMBULATORY_CARE_PROVIDER_SITE_OTHER): Payer: Medicaid Other | Admitting: Adult Health

## 2020-02-26 ENCOUNTER — Encounter: Payer: Self-pay | Admitting: Adult Health

## 2020-02-26 ENCOUNTER — Other Ambulatory Visit: Payer: Self-pay

## 2020-02-26 ENCOUNTER — Other Ambulatory Visit (HOSPITAL_COMMUNITY)
Admission: RE | Admit: 2020-02-26 | Discharge: 2020-02-26 | Disposition: A | Discharge status: Home / Self Care | Payer: Medicaid Other | Source: Ambulatory Visit | Attending: Adult Health | Admitting: Adult Health

## 2020-02-26 VITALS — BP 110/80 | HR 93 | Temp 96.9°F | Resp 16 | Wt 162.8 lb

## 2020-02-26 DIAGNOSIS — Z124 Encounter for screening for malignant neoplasm of cervix: Secondary | ICD-10-CM | POA: Diagnosis not present

## 2020-02-26 DIAGNOSIS — Z3042 Encounter for surveillance of injectable contraceptive: Secondary | ICD-10-CM | POA: Diagnosis not present

## 2020-02-26 DIAGNOSIS — E559 Vitamin D deficiency, unspecified: Secondary | ICD-10-CM

## 2020-02-26 DIAGNOSIS — E039 Hypothyroidism, unspecified: Secondary | ICD-10-CM

## 2020-02-26 DIAGNOSIS — R748 Abnormal levels of other serum enzymes: Secondary | ICD-10-CM | POA: Insufficient documentation

## 2020-02-26 LAB — POCT URINE PREGNANCY: Preg Test, Ur: NEGATIVE

## 2020-02-26 MED ORDER — MEDROXYPROGESTERONE ACETATE 150 MG/ML IM SUSP
150.0000 mg | Freq: Once | INTRAMUSCULAR | Status: AC
  Administered 2020-02-26: 150 mg via INTRAMUSCULAR

## 2020-02-26 NOTE — Patient Instructions (Addendum)
BRAND of vitamins you asked about, RAINBOW, NOW brand are ok - get one with calcium in it.      Hypothyroidism  Hypothyroidism is when the thyroid gland does not make enough of certain hormones (it is underactive). The thyroid gland is a small gland located in the lower front part of the neck, just in front of the windpipe (trachea). This gland makes hormones that help control how the body uses food for energy (metabolism) as well as how the heart and brain function. These hormones also play a role in keeping your bones strong. When the thyroid is underactive, it produces too little of the hormones thyroxine (T4) and triiodothyronine (T3). What are the causes? This condition may be caused by:  Hashimoto's disease. This is a disease in which the body's disease-fighting system (immune system) attacks the thyroid gland. This is the most common cause.  Viral infections.  Pregnancy.  Certain medicines.  Birth defects.  Past radiation treatments to the head or neck for cancer.  Past treatment with radioactive iodine.  Past exposure to radiation in the environment.  Past surgical removal of part or all of the thyroid.  Problems with a gland in the center of the brain (pituitary gland).  Lack of enough iodine in the diet. What increases the risk? You are more likely to develop this condition if:  You are female.  You have a family history of thyroid conditions.  You use a medicine called lithium.  You take medicines that affect the immune system (immunosuppressants). What are the signs or symptoms? Symptoms of this condition include:  Feeling as though you have no energy (lethargy).  Not being able to tolerate cold.  Weight gain that is not explained by a change in diet or exercise habits.  Lack of appetite.  Dry skin.  Coarse hair.  Menstrual irregularity.  Slowing of thought processes.  Constipation.  Sadness or depression. How is this diagnosed? This  condition may be diagnosed based on:  Your symptoms, your medical history, and a physical exam.  Blood tests. You may also have imaging tests, such as an ultrasound or MRI. How is this treated? This condition is treated with medicine that replaces the thyroid hormones that your body does not make. After you begin treatment, it may take several weeks for symptoms to go away. Follow these instructions at home:  Take over-the-counter and prescription medicines only as told by your health care provider.  If you start taking any new medicines, tell your health care provider.  Keep all follow-up visits as told by your health care provider. This is important. ? As your condition improves, your dosage of thyroid hormone medicine may change. ? You will need to have blood tests regularly so that your health care provider can monitor your condition. Contact a health care provider if:  Your symptoms do not get better with treatment.  You are taking thyroid replacement medicine and you: ? Sweat a lot. ? Have tremors. ? Feel anxious. ? Lose weight rapidly. ? Cannot tolerate heat. ? Have emotional swings. ? Have diarrhea. ? Feel weak. Get help right away if you have:  Chest pain.  An irregular heartbeat.  A rapid heartbeat.  Difficulty breathing. Summary  Hypothyroidism is when the thyroid gland does not make enough of certain hormones (it is underactive).  When the thyroid is underactive, it produces too little of the hormones thyroxine (T4) and triiodothyronine (T3).  The most common cause is Hashimoto's disease, a disease in which  the body's disease-fighting system (immune system) attacks the thyroid gland. The condition can also be caused by viral infections, medicine, pregnancy, or past radiation treatment to the head or neck.  Symptoms may include weight gain, dry skin, constipation, feeling as though you do not have energy, and not being able to tolerate cold.  This condition  is treated with medicine to replace the thyroid hormones that your body does not make. This information is not intended to replace advice given to you by your health care provider. Make sure you discuss any questions you have with your health care provider. Document Revised: 11/29/2017 Document Reviewed: 11/27/2017 Elsevier Patient Education  [place]. Medroxyprogesterone injection [Contraceptive] What is this medicine? MEDROXYPROGESTERONE (me DROX ee proe JES te rone) contraceptive injections prevent pregnancy. They provide effective birth control for 3 months. Depo-subQ Provera 104 is also used for treating pain related to endometriosis. This medicine may be used for other purposes; ask your health care provider or pharmacist if you have questions. COMMON BRAND NAME(S): Depo-Provera, Depo-subQ Provera 104 What should I tell my health care provider before I take this medicine? They need to know if you have any of these conditions:  frequently drink alcohol  asthma  blood vessel disease or a history of a blood clot in the lungs or legs  bone disease such as osteoporosis  breast cancer  diabetes  eating disorder (anorexia nervosa or bulimia)  high blood pressure  HIV infection or AIDS  kidney disease  liver disease  mental depression  migraine  seizures (convulsions)  stroke  tobacco smoker  vaginal bleeding  an unusual or allergic reaction to medroxyprogesterone, other hormones, medicines, foods, dyes, or preservatives  pregnant or trying to get pregnant  breast-feeding How should I use this medicine? Depo-Provera Contraceptive injection is given into a muscle. Depo-subQ Provera 104 injection is given under the skin. These injections are given by a health care professional. You must not be pregnant before getting an injection. The injection is usually given during the first 5 days after the start of a menstrual period or 6 weeks after delivery of a  baby. Talk to your pediatrician regarding the use of this medicine in children. Special care may be needed. These injections have been used in female children who have started having menstrual periods. Overdosage: If you think you have taken too much of this medicine contact a poison control center or emergency room at once. NOTE: This medicine is only for you. Do not share this medicine with others. What if I miss a dose? Try not to miss a dose. You must get an injection once every 3 months to maintain birth control. If you cannot keep an appointment, call and reschedule it. If you wait longer than 13 weeks between Depo-Provera contraceptive injections or longer than 14 weeks between Depo-subQ Provera 104 injections, you could get pregnant. Use another method for birth control if you miss your appointment. You may also need a pregnancy test before receiving another injection. What may interact with this medicine? Do not take this medicine with any of the following medications:  bosentan This medicine may also interact with the following medications:  aminoglutethimide  antibiotics or medicines for infections, especially rifampin, rifabutin, rifapentine, and griseofulvin  aprepitant  barbiturate medicines such as phenobarbital or primidone  bexarotene  carbamazepine  medicines for seizures like ethotoin, felbamate, oxcarbazepine, phenytoin, topiramate  modafinil  St. John's wort This list may not describe all possible interactions. Give your health care provider  a list of all the medicines, herbs, non-prescription drugs, or dietary supplements you use. Also tell them if you smoke, drink alcohol, or use illegal drugs. Some items may interact with your medicine. What should I watch for while using this medicine? This drug does not protect you against HIV infection (AIDS) or other sexually transmitted diseases. Use of this product may cause you to lose calcium from your bones. Loss of  calcium may cause weak bones (osteoporosis). Only use this product for more than 2 years if other forms of birth control are not right for you. The longer you use this product for birth control the more likely you will be at risk for weak bones. Ask your health care professional how you can keep strong bones. You may have a change in bleeding pattern or irregular periods. Many females stop having periods while taking this drug. If you have received your injections on time, your chance of being pregnant is very low. If you think you may be pregnant, see your health care professional as soon as possible. Tell your health care professional if you want to get pregnant within the next year. The effect of this medicine may last a long time after you get your last injection. What side effects may I notice from receiving this medicine? Side effects that you should report to your doctor or health care professional as soon as possible:  allergic reactions like skin rash, itching or hives, swelling of the face, lips, or tongue  breast tenderness or discharge  breathing problems  changes in vision  depression  feeling faint or lightheaded, falls  fever  pain in the abdomen, chest, groin, or leg  problems with balance, talking, walking  unusually weak or tired  yellowing of the eyes or skin Side effects that usually do not require medical attention (report to your doctor or health care professional if they continue or are bothersome):  acne  fluid retention and swelling  headache  irregular periods, spotting, or absent periods  temporary pain, itching, or skin reaction at site where injected  weight gain This list may not describe all possible side effects. Call your doctor for medical advice about side effects. You may report side effects to FDA at 1-800-FDA-1088. Where should I keep my medicine? This does not apply. The injection will be given to you by a health care  professional. NOTE: This sheet is a summary. It may not cover all possible information. If you have questions about this medicine, talk to your doctor, pharmacist, or health care provider.  2020 Elsevier/Gold Standard (2009-01-07 18:37:56)   Pap Test Why am I having this test? A Pap test, also called a Pap smear, is a screening test to check for signs of:  Cancer of the vagina, cervix, and uterus. The cervix is the lower part of the uterus that opens into the vagina.  Infection.  Changes that may be a sign that cancer is developing (precancerous changes). Women need this test on a regular basis. In general, you should have a Pap test every 3 years until you reach menopause or age 79. Women aged 30-60 may choose to have their Pap test done at the same time as an HPV (human papillomavirus) test every 5 years (instead of every 3 years). Your health care provider may recommend having Pap tests more or less often depending on your medical conditions and past Pap test results. What kind of sample is taken?  Your health care provider will collect a sample  of cells from the surface of your cervix. This will be done using a small cotton swab, plastic spatula, or brush. This sample is often collected during a pelvic exam, when you are lying on your back on an exam table with feet in footrests (stirrups). In some cases, fluids (secretions) from the cervix or vagina may also be collected. How do I prepare for this test?  Be aware of where you are in your menstrual cycle. If you are menstruating on the day of the test, you may be asked to reschedule.  You may need to reschedule if you have a known vaginal infection on the day of the test.  Follow instructions from your health care provider about: ? Changing or stopping your regular medicines. Some medicines can cause abnormal test results, such as digitalis and tetracycline. ? Avoiding douching or taking a bath the day before or the day of the  test. Tell a health care provider about:  Any allergies you have.  All medicines you are taking, including vitamins, herbs, eye drops, creams, and over-the-counter medicines.  Any blood disorders you have.  Any surgeries you have had.  Any medical conditions you have.  Whether you are pregnant or may be pregnant. How are the results reported? Your test results will be reported as either abnormal or normal. A false-positive result can occur. A false positive is incorrect because it means that a condition is present when it is not. A false-negative result can occur. A false negative is incorrect because it means that a condition is not present when it is. What do the results mean? A normal test result means that you do not have signs of cancer of the vagina, cervix, or uterus. An abnormal result may mean that you have:  Cancer. A Pap test by itself is not enough to diagnose cancer. You will have more tests done in this case.  Precancerous changes in your vagina, cervix, or uterus.  Inflammation of the cervix.  An STD (sexually transmitted disease).  A fungal infection.  A parasite infection. Talk with your health care provider about what your results mean. Questions to ask your health care provider Ask your health care provider, or the department that is doing the test:  When will my results be ready?  How will I get my results?  What are my treatment options?  What other tests do I need?  What are my next steps? Summary  In general, women should have a Pap test every 3 years until they reach menopause or age 17.  Your health care provider will collect a sample of cells from the surface of your cervix. This will be done using a small cotton swab, plastic spatula, or brush.  In some cases, fluids (secretions) from the cervix or vagina may also be collected. This information is not intended to replace advice given to you by your health care provider. Make sure you  discuss any questions you have with your health care provider. Document Revised: 08/26/2017 Document Reviewed: 08/26/2017 Elsevier Patient Education  2020 [employer].

## 2020-03-08 LAB — CYTOLOGY - PAP
Chlamydia: NEGATIVE
Comment: NEGATIVE
Comment: NEGATIVE
Comment: NORMAL
Diagnosis: NEGATIVE
High risk HPV: NEGATIVE
Neisseria Gonorrhea: NEGATIVE

## 2020-03-09 NOTE — Progress Notes (Signed)
PAP smear is within normal limits no evidence of malignancy or lesions. HPV, Gonorrhea, and chlamydia were performed with this PAP test and resulted negative. Repeat PAP smear recommended March 2024 unless any symptoms develop that warrant additional exam prior.   Released to [facility] as well.

## 2020-04-16 ENCOUNTER — Encounter: Payer: Self-pay | Admitting: Adult Health

## 2020-05-05 ENCOUNTER — Other Ambulatory Visit: Payer: Self-pay | Admitting: Adult Health

## 2020-05-05 NOTE — Telephone Encounter (Signed)
Pt had TSH ordered for 02/26/2020, active.  Pt has appt 05/13/2020. Courtesy refill provided.

## 2020-05-06 ENCOUNTER — Encounter: Payer: Self-pay | Admitting: Adult Health

## 2020-05-13 ENCOUNTER — Other Ambulatory Visit: Payer: Self-pay

## 2020-05-13 ENCOUNTER — Ambulatory Visit (INDEPENDENT_AMBULATORY_CARE_PROVIDER_SITE_OTHER): Payer: Medicaid Other | Admitting: Adult Health

## 2020-05-13 ENCOUNTER — Encounter: Payer: Self-pay | Admitting: Adult Health

## 2020-05-13 VITALS — Wt 182.0 lb

## 2020-05-13 DIAGNOSIS — R748 Abnormal levels of other serum enzymes: Secondary | ICD-10-CM

## 2020-05-13 DIAGNOSIS — E559 Vitamin D deficiency, unspecified: Secondary | ICD-10-CM

## 2020-05-13 DIAGNOSIS — Z3042 Encounter for surveillance of injectable contraceptive: Secondary | ICD-10-CM

## 2020-05-13 DIAGNOSIS — M545 Low back pain, unspecified: Secondary | ICD-10-CM

## 2020-05-13 DIAGNOSIS — E039 Hypothyroidism, unspecified: Secondary | ICD-10-CM

## 2020-05-13 MED ORDER — CYCLOBENZAPRINE HCL 10 MG PO TABS: 10.0000 mg | ORAL_TABLET | Freq: Three times a day (TID) | ORAL | 0 refills | Status: DC | PRN

## 2020-05-13 MED ORDER — MEDROXYPROGESTERONE ACETATE 150 MG/ML IM SUSP
150.0000 mg | Freq: Once | INTRAMUSCULAR | Status: AC
  Administered 2020-05-13: 150 mg via INTRAMUSCULAR

## 2020-05-13 NOTE — Progress Notes (Signed)
Established patient visit   Patient: Carolyn Bennett   DOB: 12-27-1991   29 y.o. Female  MRN: [id number] Visit Date: 05/13/2020  Today's healthcare provider: Marcille Buffy, FNP   Chief Complaint  Patient presents with  . Injections   Subjective    HPI Patient presents in office today to receive her depo provera injection, patient reports that she feels well today and has no other concerns to address.   Last injection for Depo Provera 02/26/20.     Meds ordered this encounter  Medications  . medroxyPROGESTERone (DEPO-PROVERA) injection 150 mg  . cyclobenzaprine (FLEXERIL) 10 MG tablet    Sig: Take 1 tablet (10 mg total) by mouth 3 (three) times daily as needed for muscle spasms (follow up advised if not improving at anytime.).    Dispense:  30 tablet    Refill:  0       Patient Active Problem List   Diagnosis Date Noted  . Encounter for surveillance of injectable contraceptive 02/26/2020  . Screening for cervical cancer 02/26/2020  . Vitamin D deficiency 02/26/2020  . Elevated liver enzymes 02/26/2020  . Exposure to influenza 01/26/2020  . Upper respiratory tract infection 01/26/2020  . Routine adult health maintenance 01/26/2020  . Fever and chills 01/26/2020  . Wheezing 01/26/2020  . Depression 01/26/2020  . Anxiety 01/26/2020  . Suspected COVID-19 virus infection 01/26/2020  . History of hypothyroidism 01/26/2020  . Hypothyroidism 10/14/2018  . Hyperlipidemia 10/14/2018  . Hyperglycemia 10/14/2018  . Preeclampsia 05/20/2017  . Indication for care in labor and delivery, antepartum 04/29/2017  . Pelvic pressure in pregnancy 03/27/2017  . Chronic tonsillitis 01/26/2016   Past Medical History:  Diagnosis Date  . Anxiety   . Depression   . Preeclampsia   . Thyroid disease    No Known Allergies     Medications: Outpatient Medications Prior to Visit  Medication Sig  . albuterol (VENTOLIN HFA) 108 (90 Base) MCG/ACT inhaler Inhale 1-2 puffs  into the lungs every 6 (six) hours as needed for wheezing or shortness of breath.  . levothyroxine (SYNTHROID) 50 MCG tablet TAKE 1 TABLET BY MOUTH EVERY DAY  . medroxyPROGESTERone (DEPO-PROVERA) 150 MG/ML injection Inject 1 mL (150 mg total) into the muscle every 3 (three) months.  . Vitamin D, Ergocalciferol, (DRISDOL) 1.25 MG (50000 UNIT) CAPS capsule Take 1 capsule (50,000 Units total) by mouth every 7 (seven) days.   No facility-administered medications prior to visit.    Review of Systems  Constitutional: Negative.   HENT: Negative.   Respiratory: Negative.   Cardiovascular: Negative.   Gastrointestinal: Negative.   Musculoskeletal: Negative.     Last CBC Lab Results  Component Value Date   WBC 6.4 01/26/2020   HGB 13.9 01/26/2020   HCT 42.8 01/26/2020   MCV 87.0 01/26/2020   MCH 28.3 01/26/2020   RDW 11.9 01/26/2020   PLT 268 70/26/3785   Last metabolic panel Lab Results  Component Value Date   GLUCOSE 99 01/26/2020   NA 138 01/26/2020   K 4.3 01/26/2020   CL 107 01/26/2020   CO2 21 (L) 01/26/2020   BUN 10 01/26/2020   CREATININE 0.69 01/26/2020   GFRNONAA >60 01/26/2020   GFRAA >60 01/26/2020   CALCIUM 9.6 01/26/2020   PROT 8.2 (H) 01/26/2020   ALBUMIN 4.9 01/26/2020   BILITOT 0.5 01/26/2020   ALKPHOS 71 01/26/2020   AST 14 (L) 01/26/2020   ALT 24 01/26/2020   ANIONGAP 10 01/26/2020  Last thyroid functions Lab Results  Component Value Date   TSH 7.271 (H) 01/26/2020      Objective    Wt 182 lb (82.6 kg)   BMI 31.24 kg/m  Wt Readings from Last 3 Encounters:  05/13/20 182 lb (82.6 kg)  02/26/20 162 lb 12.8 oz (73.8 kg)  01/26/20 161 lb 6.4 oz (73.2 kg)      Physical Exam    No results found for any visits on 05/13/20.  Assessment & Plan     1. Encounter for surveillance of injectable contraceptive  medroxyPROGESTERone (DEPO-PROVERA) injection 150 mg  2. Elevated liver enzymes  - Comprehensive Metabolic Panel (CMET)  3. Vitamin D  deficiency  - VITAMIN D 25 Hydroxy (Vit-D Deficiency, Fractures)  4. Hypothyroidism, unspecified type  - TSH  Return in about 3 months (around 08/13/2020).         Jairo Ben, FNP  [facility] [phone] (phone) [phone] (fax)  [facility]

## 2020-05-13 NOTE — Patient Instructions (Signed)
Medroxyprogesterone injection [Contraceptive] What is this medicine? MEDROXYPROGESTERONE (me DROX ee proe JES te rone) contraceptive injections prevent pregnancy. They provide effective birth control for 3 months. Depo-subQ Provera 104 is also used for treating pain related to endometriosis. This medicine may be used for other purposes; ask your health care provider or pharmacist if you have questions. COMMON BRAND NAME(S): Depo-Provera, Depo-subQ Provera 104 What should I tell my health care provider before I take this medicine? They need to know if you have any of these conditions:  frequently drink alcohol  asthma  blood vessel disease or a history of a blood clot in the lungs or legs  bone disease such as osteoporosis  breast cancer  diabetes  eating disorder (anorexia nervosa or bulimia)  high blood pressure  HIV infection or AIDS  kidney disease  liver disease  mental depression  migraine  seizures (convulsions)  stroke  tobacco smoker  vaginal bleeding  an unusual or allergic reaction to medroxyprogesterone, other hormones, medicines, foods, dyes, or preservatives  pregnant or trying to get pregnant  breast-feeding How should I use this medicine? Depo-Provera Contraceptive injection is given into a muscle. Depo-subQ Provera 104 injection is given under the skin. These injections are given by a health care professional. You must not be pregnant before getting an injection. The injection is usually given during the first 5 days after the start of a menstrual period or 6 weeks after delivery of a baby. Talk to your pediatrician regarding the use of this medicine in children. Special care may be needed. These injections have been used in female children who have started having menstrual periods. Overdosage: If you think you have taken too much of this medicine contact a poison control center or emergency room at once. NOTE: This medicine is only for you. Do not  share this medicine with others. What if I miss a dose? Try not to miss a dose. You must get an injection once every 3 months to maintain birth control. If you cannot keep an appointment, call and reschedule it. If you wait longer than 13 weeks between Depo-Provera contraceptive injections or longer than 14 weeks between Depo-subQ Provera 104 injections, you could get pregnant. Use another method for birth control if you miss your appointment. You may also need a pregnancy test before receiving another injection. What may interact with this medicine? Do not take this medicine with any of the following medications:  bosentan This medicine may also interact with the following medications:  aminoglutethimide  antibiotics or medicines for infections, especially rifampin, rifabutin, rifapentine, and griseofulvin  aprepitant  barbiturate medicines such as phenobarbital or primidone  bexarotene  carbamazepine  medicines for seizures like ethotoin, felbamate, oxcarbazepine, phenytoin, topiramate  modafinil  St. John's wort This list may not describe all possible interactions. Give your health care provider a list of all the medicines, herbs, non-prescription drugs, or dietary supplements you use. Also tell them if you smoke, drink alcohol, or use illegal drugs. Some items may interact with your medicine. What should I watch for while using this medicine? This drug does not protect you against HIV infection (AIDS) or other sexually transmitted diseases. Use of this product may cause you to lose calcium from your bones. Loss of calcium may cause weak bones (osteoporosis). Only use this product for more than 2 years if other forms of birth control are not right for you. The longer you use this product for birth control the more likely you will be at risk   for weak bones. Ask your health care professional how you can keep strong bones. You may have a change in bleeding pattern or irregular periods.  Many females stop having periods while taking this drug. If you have received your injections on time, your chance of being pregnant is very low. If you think you may be pregnant, see your health care professional as soon as possible. Tell your health care professional if you want to get pregnant within the next year. The effect of this medicine may last a long time after you get your last injection. What side effects may I notice from receiving this medicine? Side effects that you should report to your doctor or health care professional as soon as possible:  allergic reactions like skin rash, itching or hives, swelling of the face, lips, or tongue  breast tenderness or discharge  breathing problems  changes in vision  depression  feeling faint or lightheaded, falls  fever  pain in the abdomen, chest, groin, or leg  problems with balance, talking, walking  unusually weak or tired  yellowing of the eyes or skin Side effects that usually do not require medical attention (report to your doctor or health care professional if they continue or are bothersome):  acne  fluid retention and swelling  headache  irregular periods, spotting, or absent periods  temporary pain, itching, or skin reaction at site where injected  weight gain This list may not describe all possible side effects. Call your doctor for medical advice about side effects. You may report side effects to FDA at 1-800-FDA-1088. Where should I keep my medicine? This does not apply. The injection will be given to you by a health care professional. NOTE: This sheet is a summary. It may not cover all possible information. If you have questions about this medicine, talk to your doctor, pharmacist, or health care provider.  2020 Elsevier/Gold Standard (2009-01-07 18:37:56)    Back Exercises These exercises help to make your trunk and back strong. They also help to keep the lower back flexible. Doing these exercises  can help to prevent back pain or lessen existing pain.  If you have back pain, try to do these exercises 2-3 times each day or as told by your doctor.  As you get better, do the exercises once each day. Repeat the exercises more often as told by your doctor.  To stop back pain from coming back, do the exercises once each day, or as told by your doctor. Exercises Single knee to chest Do these steps 3-5 times in a row for each leg: 1. Lie on your back on a firm bed or the floor with your legs stretched out. 2. Bring one knee to your chest. 3. Grab your knee or thigh with both hands and hold them it in place. 4. Pull on your knee until you feel a gentle stretch in your lower back or buttocks. 5. Keep doing the stretch for 10-30 seconds. 6. Slowly let go of your leg and straighten it. Pelvic tilt Do these steps 5-10 times in a row: 1. Lie on your back on a firm bed or the floor with your legs stretched out. 2. Bend your knees so they point up to the ceiling. Your feet should be flat on the floor. 3. Tighten your lower belly (abdomen) muscles to press your lower back against the floor. This will make your tailbone point up to the ceiling instead of pointing down to your feet or the floor. 4. Stay  in this position for 5-10 seconds while you gently tighten your muscles and breathe evenly. Cat-cow Do these steps until your lower back bends more easily: 1. Get on your hands and knees on a firm surface. Keep your hands under your shoulders, and keep your knees under your hips. You may put padding under your knees. 2. Let your head hang down toward your chest. Tighten (contract) the muscles in your belly. Point your tailbone toward the floor so your lower back becomes rounded like the back of a cat. 3. Stay in this position for 5 seconds. 4. Slowly lift your head. Let the muscles of your belly relax. Point your tailbone up toward the ceiling so your back forms a sagging arch like the back of a  cow. 5. Stay in this position for 5 seconds.  Press-ups Do these steps 5-10 times in a row: 1. Lie on your belly (face-down) on the floor. 2. Place your hands near your head, about shoulder-width apart. 3. While you keep your back relaxed and keep your hips on the floor, slowly straighten your arms to raise the top half of your body and lift your shoulders. Do not use your back muscles. You may change where you place your hands in order to make yourself more comfortable. 4. Stay in this position for 5 seconds. 5. Slowly return to lying flat on the floor.  Bridges Do these steps 10 times in a row: 1. Lie on your back on a firm surface. 2. Bend your knees so they point up to the ceiling. Your feet should be flat on the floor. Your arms should be flat at your sides, next to your body. 3. Tighten your butt muscles and lift your butt off the floor until your waist is almost as high as your knees. If you do not feel the muscles working in your butt and the back of your thighs, slide your feet 1-2 inches farther away from your butt. 4. Stay in this position for 3-5 seconds. 5. Slowly lower your butt to the floor, and let your butt muscles relax. If this exercise is too easy, try doing it with your arms crossed over your chest. Belly crunches Do these steps 5-10 times in a row: 1. Lie on your back on a firm bed or the floor with your legs stretched out. 2. Bend your knees so they point up to the ceiling. Your feet should be flat on the floor. 3. Cross your arms over your chest. 4. Tip your chin a little bit toward your chest but do not bend your neck. 5. Tighten your belly muscles and slowly raise your chest just enough to lift your shoulder blades a tiny bit off of the floor. Avoid raising your body higher than that, because it can put too much stress on your low back. 6. Slowly lower your chest and your head to the floor. Back lifts Do these steps 5-10 times in a row: 1. Lie on your belly  (face-down) with your arms at your sides, and rest your forehead on the floor. 2. Tighten the muscles in your legs and your butt. 3. Slowly lift your chest off of the floor while you keep your hips on the floor. Keep the back of your head in line with the curve in your back. Look at the floor while you do this. 4. Stay in this position for 3-5 seconds. 5. Slowly lower your chest and your face to the floor. Contact a doctor if:  Your  back pain gets a lot worse when you do an exercise.  Your back pain does not get better 2 hours after you exercise. If you have any of these problems, stop doing the exercises. Do not do them again unless your doctor says it is okay. Get help right away if:  You have sudden, very bad back pain. If this happens, stop doing the exercises. Do not do them again unless your doctor says it is okay. This information is not intended to replace advice given to you by your health care provider. Make sure you discuss any questions you have with your health care provider. Document Revised: 09/11/2018 Document Reviewed: 09/11/2018 Elsevier Patient Education  [place].   Cyclobenzaprine tablets What is this medicine? CYCLOBENZAPRINE (sye kloe BEN za preen) is a muscle relaxer. It is used to treat muscle pain, spasms, and stiffness. This medicine may be used for other purposes; ask your health care provider or pharmacist if you have questions. COMMON BRAND NAME(S): Fexmid, Flexeril What should I tell my health care provider before I take this medicine? They need to know if you have any of these conditions:  heart disease, irregular heartbeat, or previous heart attack  liver disease  thyroid problem  an unusual or allergic reaction to cyclobenzaprine, tricyclic antidepressants, lactose, other medicines, foods, dyes, or preservatives  pregnant or trying to get pregnant  breast-feeding How should I use this medicine? Take this medicine by mouth with a glass  of water. Follow the directions on the prescription label. If this medicine upsets your stomach, take it with food or milk. Take your medicine at regular intervals. Do not take it more often than directed. Talk to your pediatrician regarding the use of this medicine in children. Special care may be needed. Overdosage: If you think you have taken too much of this medicine contact a poison control center or emergency room at once. NOTE: This medicine is only for you. Do not share this medicine with others. What if I miss a dose? If you miss a dose, take it as soon as you can. If it is almost time for your next dose, take only that dose. Do not take double or extra doses. What may interact with this medicine? Do not take this medicine with any of the following medications:  MAOIs like Carbex, Eldepryl, Marplan, Nardil, and Parnate  narcotic medicines for cough  safinamide This medicine may also interact with the following medications:  alcohol  bupropion  antihistamines for allergy, cough and cold  certain medicines for anxiety or sleep  certain medicines for bladder problems like oxybutynin, tolterodine  certain medicines for depression like amitriptyline, fluoxetine, sertraline  certain medicines for Parkinson's disease like benztropine, trihexyphenidyl  certain medicines for seizures like phenobarbital, primidone  certain medicines for stomach problems like dicyclomine, hyoscyamine  certain medicines for travel sickness like scopolamine  general anesthetics like halothane, isoflurane, methoxyflurane, propofol  ipratropium  local anesthetics like lidocaine, pramoxine, tetracaine  medicines that relax muscles for surgery  narcotic medicines for pain  phenothiazines like chlorpromazine, mesoridazine, prochlorperazine, thioridazine  verapamil This list may not describe all possible interactions. Give your health care provider a list of all the medicines, herbs,  non-prescription drugs, or dietary supplements you use. Also tell them if you smoke, drink alcohol, or use illegal drugs. Some items may interact with your medicine. What should I watch for while using this medicine? Tell your doctor or health care professional if your symptoms do not start to get  better or if they get worse. You may get drowsy or dizzy. Do not drive, use machinery, or do anything that needs mental alertness until you know how this medicine affects you. Do not stand or sit up quickly, especially if you are an older patient. This reduces the risk of dizzy or fainting spells. Alcohol may interfere with the effect of this medicine. Avoid alcoholic drinks. If you are taking another medicine that also causes drowsiness, you may have more side effects. Give your health care provider a list of all medicines you use. Your doctor will tell you how much medicine to take. Do not take more medicine than directed. Call emergency for help if you have problems breathing or unusual sleepiness. Your mouth may get dry. Chewing sugarless gum or sucking hard candy, and drinking plenty of water may help. Contact your doctor if the problem does not go away or is severe. What side effects may I notice from receiving this medicine? Side effects that you should report to your doctor or health care professional as soon as possible:  allergic reactions like skin rash, itching or hives, swelling of the face, lips, or tongue  breathing problems  chest pain  fast, irregular heartbeat  hallucinations  seizures  unusually weak or tired Side effects that usually do not require medical attention (report to your doctor or health care professional if they continue or are bothersome):  headache  nausea, vomiting This list may not describe all possible side effects. Call your doctor for medical advice about side effects. You may report side effects to FDA at 1-800-FDA-1088. Where should I keep my  medicine? Keep out of the reach of children. Store at room temperature between 15 and 30 degrees C (59 and 86 degrees F). Keep container tightly closed. Throw away any unused medicine after the expiration date. NOTE: This sheet is a summary. It may not cover all possible information. If you have questions about this medicine, talk to your doctor, pharmacist, or health care provider.  2020 Elsevier/Gold Standard (2018-11-19 12:49:26)

## 2020-05-14 LAB — COMPREHENSIVE METABOLIC PANEL
ALT: 12 IU/L (ref 0–32)
AST: 10 IU/L (ref 0–40)
Albumin/Globulin Ratio: 1.8 (ref 1.2–2.2)
Albumin: 4.6 g/dL (ref 3.9–5.0)
Alkaline Phosphatase: 94 IU/L (ref 39–117)
BUN/Creatinine Ratio: 13 (ref 9–23)
BUN: 11 mg/dL (ref 6–20)
Bilirubin Total: 0.2 mg/dL (ref 0.0–1.2)
CO2: 19 mmol/L — ABNORMAL LOW (ref 20–29)
Calcium: 9.5 mg/dL (ref 8.7–10.2)
Chloride: 101 mmol/L (ref 96–106)
Creatinine, Ser: 0.87 mg/dL (ref 0.57–1.00)
GFR calc Af Amer: 103 mL/min/{1.73_m2} (ref >59)
GFR calc non Af Amer: 90 mL/min/{1.73_m2} (ref >59)
Globulin, Total: 2.5 g/dL (ref 1.5–4.5)
Glucose: 89 mg/dL (ref 65–99)
Potassium: 4.7 mmol/L (ref 3.5–5.2)
Sodium: 137 mmol/L (ref 134–144)
Total Protein: 7.1 g/dL (ref 6.0–8.5)

## 2020-05-14 LAB — VITAMIN D 25 HYDROXY (VIT D DEFICIENCY, FRACTURES): Vit D, 25-Hydroxy: 26.2 ng/mL — ABNORMAL LOW (ref 30.0–100.0)

## 2020-05-14 LAB — TSH: TSH: 16.5 u[IU]/mL — ABNORMAL HIGH (ref 0.450–4.500)

## 2020-05-16 ENCOUNTER — Other Ambulatory Visit: Payer: Self-pay | Admitting: Adult Health

## 2020-05-16 ENCOUNTER — Encounter: Payer: Self-pay | Admitting: Adult Health

## 2020-05-16 DIAGNOSIS — E559 Vitamin D deficiency, unspecified: Secondary | ICD-10-CM

## 2020-05-16 DIAGNOSIS — E039 Hypothyroidism, unspecified: Secondary | ICD-10-CM

## 2020-05-16 MED ORDER — LEVOTHYROXINE SODIUM 75 MCG PO TABS: 75.0000 ug | ORAL_TABLET | Freq: Every day | ORAL | 0 refills | Status: DC

## 2020-05-16 MED ORDER — VITAMIN D (ERGOCALCIFEROL) 1.25 MG (50000 UNIT) PO CAPS: 50000.0000 [IU] | ORAL_CAPSULE | ORAL | 0 refills | Status: DC

## 2020-05-16 NOTE — Progress Notes (Signed)
Message reviewed by patient via MyChart.

## 2020-05-16 NOTE — Progress Notes (Signed)
Meds ordered this encounter  Medications  . levothyroxine (SYNTHROID) 75 MCG tablet    Sig: Take 1 tablet (75 mcg total) by mouth daily.    Dispense:  90 tablet    Refill:  0  . Vitamin D, Ergocalciferol, (DRISDOL) 1.25 MG (50000 UNIT) CAPS capsule    Sig: Take 1 capsule (50,000 Units total) by mouth every 7 (seven) days. For total of 12 weeks. Take ONE tablet ONCE weekly by mouth    Dispense:  12 capsule    Refill:  0   Recheck lab in one month.   Orders Placed This Encounter  Procedures  . Thyroid Panel With TSH    Standing Status:   Future    Standing Expiration Date:   07/16/2020    Recheck vitamin D level around 4 months.

## 2020-05-16 NOTE — Progress Notes (Signed)
Provider sent patient [name] message with results and follow up questions.

## 2020-06-01 ENCOUNTER — Encounter: Payer: Self-pay | Admitting: Adult Health

## 2020-06-02 ENCOUNTER — Other Ambulatory Visit: Payer: Self-pay | Admitting: Adult Health

## 2020-06-02 ENCOUNTER — Telehealth: Payer: Self-pay

## 2020-06-02 DIAGNOSIS — Z638 Other specified problems related to primary support group: Secondary | ICD-10-CM | POA: Insufficient documentation

## 2020-06-02 DIAGNOSIS — F419 Anxiety disorder, unspecified: Secondary | ICD-10-CM

## 2020-06-02 NOTE — Telephone Encounter (Signed)
Can you please review this order was initially placed back in January. KW

## 2020-06-02 NOTE — Telephone Encounter (Signed)
Copied from CRM [id number]. Topic: General - Other >> Jun 02, 2020 10:46 AM Dalphine Handing A wrote: Patient called to see if there is anyway Flinchum can expedite her request for counseling referral

## 2020-06-02 NOTE — Telephone Encounter (Signed)
Attempted to contact patient back in regards to referral but no voicemail box was set up. If patient returns call okay to advise patient that referral was placed on 1/26 for counseling patient had an appt from was was documented and canceled appt and was suppose to reschedule with there office and never did. Below you will find the contact number to [facility] please give phone number to patient so she can reschedule. KW  Referral Notes Number of Notes: 3 . Type Date User Summary Attachment  General 03/29/2020 11:11 AM Maye Hides - -  Note   Pt had appointment scheduled for 03/09/20 but cancelled and did not reschedule        . Type Date User Summary Attachment  General 01/26/2020 6:35 PM Maye Hides - -  Note   Referral sent to [facility]. Their office will contact pt Phone: [phone]        . Type Date User Summary Attachment  Provider Comments 01/26/2020 10:47 AM Flinchum, Eula Fried, FNP Provider Comments -  Note   Needs counseling - may try [facility] in [place] or anywhere medicaid is accepted.  Reason for Referral: anxiety / multiple stressors/ mild depression/ GAD score 21  Referral discussed with patient: yes Best contact number of patient for referral team: on file  Has patient been seen by a specialist for this issue before: no If so, who (practice/provider): none  Does the patient wish to return: n/a  Patient provider preference for referral: provider  Patient location preference for referral: [place] / virtual if available to her.

## 2020-06-02 NOTE — Telephone Encounter (Signed)
She should have heard from referral in January already can we follow up with referrals  ? If need new referral ok to place.

## 2020-06-02 NOTE — Telephone Encounter (Signed)
Done order has been placed and provider messaged patient in mychart and she is to call if not heard within 2 weeks.  Thank you,  Marvell Fuller MSN, AGNP-C, FNP-C  Family Nurse Practitioner  Adult Geriatric Nurse Practitioner

## 2020-06-02 NOTE — Progress Notes (Signed)
Orders Placed This Encounter  Procedures   Ambulatory referral to Psychiatry    Referral Priority:   Routine    Referral Type:   Psychiatric    Referral Reason:   Specialty Services Required    Requested Specialty:   Psychiatry    Number of Visits Requested:   1    

## 2020-06-02 NOTE — Telephone Encounter (Signed)
Pt called back, she specified that she needs this counseling specifically for her and her spouse. She is seeking referral for marriage counseling.

## 2020-06-02 NOTE — Telephone Encounter (Signed)
Im sorry Carolyn Bennett I misread patient is asking for a new order to be placed in for marriage counseling. KW

## 2020-06-03 NOTE — Telephone Encounter (Signed)
Attempted to contact pt. To inform of message from PCP re: Referral being placed, per pt. Request.  Unable to leave message, as vm not set up.  Pt. Has not viewed her message in MyChart at this time.

## 2020-06-09 ENCOUNTER — Encounter: Payer: Self-pay | Admitting: Adult Health

## 2020-07-28 NOTE — Progress Notes (Signed)
Established patient visit  I,April Miller,acting as a scribe for [employer], FNP.,have documented all relevant documentation on the behalf of Jairo Ben, FNP,as directed by  Jairo Ben, FNP while in the presence of Jairo Ben, FNP.   Patient: Carolyn Bennett   DOB: Jun 22, 1991   29 y.o. Female  MRN: [id number] Visit Date: 07/29/2020  Today's healthcare provider: Jairo Ben, FNP   Chief Complaint  Patient presents with   Follow-up   Obesity   Contraception   Subjective    HPI  Obesity Patient presents in office today to discuss weight gain. TSH was 16.5 last 2 months. Recheck lab today.  Previous TSH that was in the 7 range.  Patient was encouraged not to take Synthroid with any other medications 2 hours before or 2 hours after especially vitamins and milk.  She has been doing that and we recheck the level today which is 2 months post her doing 75 mcg of Synthroid. She has had some weight gain.  This concerned her.  Increased stress, trying to do marriage counseling.  She was referred to psychiatry in the past, however no one is taking insurance at this time.  Patient is very tearful today at the visit.  She is concerned at how stress she has been lately.  He has multiple stressors, she does have young young children she is caring for.  She also has a husband.  He has no family support nearby. Work stress thought she was [place] lose job. She cant stop worrying. Can not sleep worry about the past.  She denies being suicidal or homicidal, she does have thoughts of thinking that she would be better off not being here, however she reports that she has no intents and she has young children that she has to take care of and knows that she has to be here.  She has no intent of suicide.  He is having trouble sleeping and falling asleep due to excessive thinking about all the troubles that she has currently.  The rise in the Covid  cases and the media has not helped the stress.  He does report an increase in tearfulness, and reports that even her young child notices that she is crying all the time.  Patient has tried Lexapro in the past and did not like the side effects. Patient is also due for her nest depo-provera injection. Last one was given on 05/13/2020.  She had negative pregnancy test today and Depo injection given.  She prefers Depo-Provera over other birth control at this time.  She is aware this can cause some weight gain. Is not having periods.  Patient  denies any fever, body aches,chills, rash, chest pain, shortness of breath, nausea, vomiting, or diarrhea.  Denies dizziness, lightheadedness, pre syncopal or syncopal episodes.     Patient Active Problem List   Diagnosis Date Noted   Body mass index (BMI) of 33.0-33.9 in adult 07/29/2020   Stress due to family tension 06/02/2020   Encounter for surveillance of injectable contraceptive 02/26/2020   Screening for cervical cancer 02/26/2020   Vitamin D deficiency 02/26/2020   Elevated liver enzymes 02/26/2020   Screening for blood or protein in urine 01/26/2020   Fever and chills 01/26/2020   Wheezing 01/26/2020   Depression with anxiety 01/26/2020   Anxiety 01/26/2020   Suspected COVID-19 virus infection 01/26/2020   History of hypothyroidism 01/26/2020   Hypothyroidism 10/14/2018   Hyperlipidemia 10/14/2018   Hyperglycemia  10/14/2018   Chronic tonsillitis 01/26/2016   Past Medical History:  Diagnosis Date   Anxiety    Depression    Preeclampsia    Thyroid disease    Past Surgical History:  Procedure Laterality Date   CESAREAN SECTION N/A 05/22/2017   Procedure: STAT CESAREAN SECTION;  Surgeon: Christeen DouglasBeasley, Bethany, MD;  Location: [facility] OR[facility];  Service: Obstetrics;  Laterality: N/A;   VAGINAL SEPTOPLASTY  05/22/2017   Procedure: VAGINAL Laceration repair;  Surgeon: Christeen DouglasBeasley, Bethany, MD;  Location: [facility];  Service:  Obstetrics;;   Social History   Tobacco Use   Smoking status: Former Smoker   Smokeless tobacco: Never Used  Substance Use Topics   Alcohol use: No   Drug use: No   Social History   Socioeconomic History   Marital status: Married    Spouse name: Not on file   Number of children: Not on file   Years of education: Not on file   Highest education level: Not on file  Occupational History   Not on file  Tobacco Use   Smoking status: Former Smoker   Smokeless tobacco: Never Used  Substance and Sexual Activity   Alcohol use: No   Drug use: No   Sexual activity: Yes    Birth control/protection: Injection  Other Topics Concern   Not on file  Social History Narrative   Not on file   Social Determinants of Health   Financial Resource Strain:    Difficulty of Paying Living Expenses:   Food Insecurity:    Worried About Programme researcher, broadcasting/film/videounning Out of Food in the Last Year:    Baristaan Out of Food in the Last Year:   Transportation Needs:    Freight forwarderLack of Transportation (Medical):    Lack of Transportation (Non-Medical):   Physical Activity:    Days of Exercise per Week:    Minutes of Exercise per Session:   Stress:    Feeling of Stress :   Social Connections:    Frequency of Communication with Friends and Family:    Frequency of Social Gatherings with Friends and Family:    Attends Religious Services:    Active Member of Clubs or Organizations:    Attends Engineer, structuralClub or Organization Meetings:    Marital Status:   Intimate Partner Violence:    Fear of Current or Ex-Partner:    Emotionally Abused:    Physically Abused:    Sexually Abused:    Family Status  Relation Name Status   Father  (Not Specified)   MGM  (Not Specified)   Family History  Problem Relation Age of Onset   Diabetes Father    Cancer Father    Cancer Maternal Grandmother    Diabetes Maternal Grandmother    No Known Allergies     Medications: Outpatient Medications Prior to Visit    Medication Sig   albuterol (VENTOLIN HFA) 108 (90 Base) MCG/ACT inhaler Inhale 1-2 puffs into the lungs every 6 (six) hours as needed for wheezing or shortness of breath.   cyclobenzaprine (FLEXERIL) 10 MG tablet Take 1 tablet (10 mg total) by mouth 3 (three) times daily as needed for muscle spasms (follow up advised if not improving at anytime.).   levothyroxine (SYNTHROID) 75 MCG tablet Take 1 tablet (75 mcg total) by mouth daily.   medroxyPROGESTERone (DEPO-PROVERA) 150 MG/ML injection Inject 1 mL (150 mg total) into the muscle every 3 (three) months.   Vitamin D, Ergocalciferol, (DRISDOL) 1.25 MG (50000 UNIT) CAPS capsule Take 1 capsule (50,000 Units  total) by mouth every 7 (seven) days. For total of 12 weeks. Take ONE tablet ONCE weekly by mouth   No facility-administered medications prior to visit.    Review of Systems  Constitutional: Positive for fatigue and unexpected weight change. Negative for appetite change, chills and fever.  HENT: Negative.   Respiratory: Negative.  Negative for chest tightness and shortness of breath.   Cardiovascular: Negative.  Negative for chest pain and palpitations.  Gastrointestinal: Negative.  Negative for abdominal pain, nausea and vomiting.  Genitourinary: Negative.   Musculoskeletal: Negative.   Skin: Negative.   Neurological: Negative.  Negative for dizziness and weakness.  Hematological: Negative.   Psychiatric/Behavioral: Negative.    Last CBC Lab Results  Component Value Date   WBC 6.4 01/26/2020   HGB 13.9 01/26/2020   HCT 42.8 01/26/2020   MCV 87.0 01/26/2020   MCH 28.3 01/26/2020   RDW 11.9 01/26/2020   PLT 268 01/26/2020   Last metabolic panel Lab Results  Component Value Date   GLUCOSE 89 05/13/2020   NA 137 05/13/2020   K 4.7 05/13/2020   CL 101 05/13/2020   CO2 19 (L) 05/13/2020   BUN 11 05/13/2020   CREATININE 0.87 05/13/2020   GFRNONAA 90 05/13/2020   GFRAA 103 05/13/2020   CALCIUM 9.5 05/13/2020   PROT 7.1  05/13/2020   ALBUMIN 4.6 05/13/2020   LABGLOB 2.5 05/13/2020   AGRATIO 1.8 05/13/2020   BILITOT 0.2 05/13/2020   ALKPHOS 94 05/13/2020   AST 10 05/13/2020   ALT 12 05/13/2020   ANIONGAP 10 01/26/2020   Last lipids Lab Results  Component Value Date   CHOL 200 01/26/2020   HDL 39 (L) 01/26/2020   LDLCALC 147 (H) 01/26/2020   TRIG 71 01/26/2020   CHOLHDL 5.1 01/26/2020   Last hemoglobin A1c No results found for: HGBA1C Last thyroid functions Lab Results  Component Value Date   TSH 16.500 (H) 05/13/2020   Last vitamin D Lab Results  Component Value Date   VD25OH 26.2 (L) 05/13/2020   Last vitamin B12 and Folate No results found for: VITAMINB12, FOLATE      Objective    BP (!) 129/84 (BP Location: Left Arm, Patient Position: Sitting, Cuff Size: Large)    Pulse 80    Temp 98.2 F (36.8 C) (Other (Comment))    Resp 16    Ht 5\' 4"  (1.626 m)    Wt 196 lb (88.9 kg)    SpO2 97%    BMI 33.64 kg/m  BP Readings from Last 3 Encounters:  07/29/20 (!) 129/84  02/26/20 110/80  01/26/20 140/80      GAD 7 : Generalized Anxiety Score 07/29/2020 02/26/2020 01/26/2020  Nervous, Anxious, on Edge 3 1 3   Control/stop worrying 3 1 3   Worry too much - different things 3 1 3   Trouble relaxing 3 2 3   Restless 3 1 3   Easily annoyed or irritable 3 2 3   Afraid - awful might happen 3 1 3   Total GAD 7 Score 21 9 21   Anxiety Difficulty Extremely difficult Somewhat difficult Very difficult    Depression screen [facility] 2/9 07/29/2020 02/26/2020 01/26/2020  Decreased Interest 2 1 2   Down, Depressed, Hopeless 3 1 2   PHQ - 2 Score 5 2 4   Altered sleeping 3 2 2   Tired, decreased energy 3 1 2   Change in appetite 3 1 0  Feeling bad or failure about yourself  3 1 0  Trouble concentrating 3 1 0  Moving  slowly or fidgety/restless 3 0 0  Suicidal thoughts 2 0 0  PHQ-9 Score 25 8 8   Difficult doing work/chores Extremely dIfficult Not difficult at all Very difficult    Physical Exam Vitals reviewed.    Constitutional:      General: She is not in acute distress.    Appearance: Normal appearance. She is not ill-appearing, toxic-appearing or diaphoretic.     Comments: Patient is alert and oriented and responsive to questions Engages in eye contact with provider. Speaks in full sentences without any pauses without any shortness of breath or distress.    HENT:     Head: Normocephalic and atraumatic.     Right Ear: External ear normal.     Left Ear: External ear normal.     Nose: Nose normal.     Mouth/Throat:     Mouth: Mucous membranes are moist.  Eyes:     Pupils: Pupils are equal, round, and reactive to light.  Neck:     Thyroid: No thyroid mass, thyromegaly or thyroid tenderness.     Vascular: No carotid bruit.     Trachea: Trachea and phonation normal.  Cardiovascular:     Rate and Rhythm: Normal rate and regular rhythm.     Pulses: Normal pulses.     Heart sounds: Normal heart sounds. No murmur heard.  No friction rub. No gallop.   Pulmonary:     Effort: Pulmonary effort is normal. No respiratory distress.     Breath sounds: Normal breath sounds. No stridor. No wheezing, rhonchi or rales.  Chest:     Chest wall: No tenderness.  Abdominal:     Palpations: Abdomen is soft.  Genitourinary:    Comments: deferred  Musculoskeletal:        General: Normal range of motion.     Cervical back: Full passive range of motion without pain, normal range of motion and neck supple. No rigidity or tenderness.     Right lower leg: No edema.     Left lower leg: No edema.  Lymphadenopathy:     Cervical: No cervical adenopathy.  Skin:    General: Skin is warm and dry.     Capillary Refill: Capillary refill takes less than 2 seconds.     Findings: No rash.  Neurological:     General: No focal deficit present.     Mental Status: She is alert.     Gait: Gait normal.  Psychiatric:        Attention and Perception: Attention and perception normal.        Mood and Affect: Mood normal. Affect  is tearful (at times due to talking about known stressors ).        Speech: Speech normal.        Behavior: Behavior normal. Behavior is cooperative.        Thought Content: Thought content normal.        Cognition and Memory: Cognition normal.        Judgment: Judgment normal.      Results for orders placed or performed in visit on 07/29/20  POCT urine pregnancy  Result Value Ref Range   Preg Test, Ur Negative Negative  POCT urinalysis dipstick  Result Value Ref Range   Color, UA Yellow    Clarity, UA Clear    Glucose, UA Negative Negative   Bilirubin, UA Negative    Ketones, UA Negative    Spec Grav, UA 1.020 1.010 - 1.025   Blood, UA Negative  pH, UA 6.5 5.0 - 8.0   Protein, UA Negative Negative   Urobilinogen, UA 0.2 0.2 or 1.0 E.U./dL   Nitrite, UA Negative    Leukocytes, UA Negative Negative   Appearance Normal    Odor Normal     Assessment & Plan    1. Depression with anxiety Patient agrees to try medication below discussed side effects with patient and blackbox warning of drug with increased suicidal thoughts or intents.  Patient will reach out to me should she have any of those symptoms, as well as seek emergency care should she have them after hours.  Advised patient that Wellbutrin will not kick in to full efficacy until 4-6 weeks and will not be an immediate effect. We will try hydroxyzine starting with a half a tablet as directed as needed for anxiety.  Would be more beneficial than benzodiazepines.  Will reach out to me if any symptoms are changing or getting worse. She has a counseling card and has multiple numbers and resources to psychiatric care however no one is taking her insurance at this time it is been hard to get in.  She is trying to get into couples therapy.  If patient needs assistance or new referral she will reach out to me as well. - buPROPion (WELLBUTRIN XL) 300 MG 24 hr tablet; Take 1 tablet (300 mg total) by mouth daily.  Dispense: 90 tablet;  Refill: 0 - hydrOXYzine (ATARAX/VISTARIL) 50 MG tablet; Take 1 tablet (50 mg total) by mouth 3 (three) times daily as needed (may cause drowsiness).  Dispense: 90 tablet; Refill: 0  2. Encounter for surveillance of injectable contraceptive Return in 3 months for Depo-Provera shot if she desires. - medroxyPROGESTERone (DEPO-PROVERA) injection 150 mg - POCT urine pregnancy  3. Vitamin D deficiency Recheck vitamin D level today has a history of deficiency. - VITAMIN D 25 Hydroxy (Vit-D Deficiency, Fractures)  4. Hypothyroidism, unspecified type Recheck thyroid level today, discussed contributory to her weight gain. - TSH  5. Screening for blood or protein in urine Screen urine was within normal limits.  Urine pregnancy test was negative in the office. - POCT urinalysis dipstick  6. Body mass index (BMI) of 33.0-33.9 in adult Diet exercise and lifestyle changes, patient is doing well with diet and exercise, however has had increased stressors as well as hypothyroidism contributing likely to weight gain.  Deferred Provera could also be contributing sent to some weight gain as well.  Her negatives discussed she does desire to continue Depo-Provera at this time.  Red Flags discussed. The patient was given clear instructions to go to ER or return to medical center if any red flags develop, symptoms do not improve, worsen or new problems develop. They verbalized understanding.  Return in about 1 month (around 08/29/2020), or if symptoms worsen or fail to improve, for at any time for any worsening symptoms, Go to Emergency room/ urgent care if worse.      IBeverely Pace Mervil Wacker, FNP, have reviewed all documentation for this visit. The documentation on 07/29/20 for the exam, diagnosis, procedures, and orders are all accurate and complete.    Jairo Ben, FNP  [facility] [phone] (phone) [phone] (fax)  [facility]

## 2020-07-29 ENCOUNTER — Other Ambulatory Visit: Payer: Self-pay

## 2020-07-29 ENCOUNTER — Encounter: Payer: Self-pay | Admitting: Adult Health

## 2020-07-29 ENCOUNTER — Ambulatory Visit (INDEPENDENT_AMBULATORY_CARE_PROVIDER_SITE_OTHER): Payer: Medicaid Other | Admitting: Adult Health

## 2020-07-29 VITALS — BP 129/84 | HR 80 | Temp 98.2°F | Resp 16 | Ht 64.0 in | Wt 196.0 lb

## 2020-07-29 DIAGNOSIS — Z3042 Encounter for surveillance of injectable contraceptive: Secondary | ICD-10-CM

## 2020-07-29 DIAGNOSIS — Z6833 Body mass index (BMI) 33.0-33.9, adult: Secondary | ICD-10-CM | POA: Insufficient documentation

## 2020-07-29 DIAGNOSIS — Z1389 Encounter for screening for other disorder: Secondary | ICD-10-CM

## 2020-07-29 DIAGNOSIS — E559 Vitamin D deficiency, unspecified: Secondary | ICD-10-CM | POA: Diagnosis not present

## 2020-07-29 DIAGNOSIS — F418 Other specified anxiety disorders: Secondary | ICD-10-CM

## 2020-07-29 DIAGNOSIS — E039 Hypothyroidism, unspecified: Secondary | ICD-10-CM

## 2020-07-29 LAB — POCT URINALYSIS DIPSTICK
Appearance: NORMAL
Bilirubin, UA: NEGATIVE
Blood, UA: NEGATIVE
Glucose, UA: NEGATIVE
Ketones, UA: NEGATIVE
Leukocytes, UA: NEGATIVE
Nitrite, UA: NEGATIVE
Odor: NORMAL
Protein, UA: NEGATIVE
Spec Grav, UA: 1.02 (ref 1.010–1.025)
Urobilinogen, UA: 0.2 E.U./dL
pH, UA: 6.5 (ref 5.0–8.0)

## 2020-07-29 LAB — POCT URINE PREGNANCY: Preg Test, Ur: NEGATIVE

## 2020-07-29 MED ORDER — BUPROPION HCL ER (XL) 300 MG PO TB24: 300.0000 mg | ORAL_TABLET | Freq: Every day | ORAL | 0 refills | Status: DC

## 2020-07-29 MED ORDER — HYDROXYZINE HCL 50 MG PO TABS: 50.0000 mg | ORAL_TABLET | Freq: Three times a day (TID) | ORAL | 0 refills | Status: DC | PRN

## 2020-07-29 MED ORDER — MEDROXYPROGESTERONE ACETATE 150 MG/ML IM SUSP
150.0000 mg | Freq: Once | INTRAMUSCULAR | Status: AC
  Administered 2020-07-29: 150 mg via INTRAMUSCULAR

## 2020-07-29 NOTE — Patient Instructions (Addendum)
Psychiatric/Counseling Resources Discussed As Follows:  If Emergency please seek Emergency Room Care Immediately or Call 911.   [facility] Address:  [address] [place], [place] [address] Phone: [phone] Website : [email or web address]   [facility] Address:  [address] [place], [place] [address] Phone: [phone] Fax: [phone] Website: [email or web address] How To Access Our Services Because our main goal is to meet the needs of our consumers, [facility] operates on a walk-in basis! To access services, there are just 3 easy steps: 1) Walk in any Monday, Wednesday or Friday between 8:00 am and 3:00 pm and complete our consumer paperwork 2) A Comprehensive Clinical Assessment (CCA) will be completed and appropriate service recommendations will be provided 3) Recommendations are sent to [facility] team members and the appropriate staff will call you within days. Advanced Access Open M - F, 8:00 am - 8:00 pm  Mental health crisis services for all age groups  Triage  Psychiatric Evaluations  Involuntary Commitments  [facility]  Address: [address] [place], [place] [address] Website : [email or web address] Walk in's accepted see web site or call for more information Phone : [phone] Also has [facility] [phone]    Psychology Today Find a therapist by searching online in your area or specialist by your diagnosis Website:  [email or web address]     Bupropion extended-release tablets (Depression/Mood Disorders) What is this medicine? BUPROPION (byoo PROE pee on) is used to treat depression. This medicine may be used for other purposes; ask your health care provider or pharmacist if you have questions. COMMON BRAND NAME(S): Aplenzin, Budeprion XL, Forfivo XL, Wellbutrin XL What should I tell my health care provider before I take this  medicine? They need to know if you have any of these conditions:  an eating disorder, such as anorexia or bulimia  bipolar disorder or psychosis  diabetes or high blood sugar, treated with medication  glaucoma  head injury or brain tumor  heart disease, previous heart attack, or irregular heart beat  high blood pressure  kidney or liver disease  seizures (convulsions)  suicidal thoughts or a previous suicide attempt  Tourette's syndrome  weight loss  an unusual or allergic reaction to bupropion, other medicines, foods, dyes, or preservatives  breast-feeding  pregnant or trying to become pregnant How should I use this medicine? Take this medicine by mouth with a glass of water. Follow the directions on the prescription label. You can take it with or without food. If it upsets your stomach, take it with food. Do not crush, chew, or cut these tablets. This medicine is taken once daily at the same time each day. Do not take your medicine more often than directed. Do not stop taking this medicine suddenly except upon the advice of your doctor. Stopping this medicine too quickly may cause serious side effects or your condition may worsen. A special MedGuide will be given to you by the pharmacist with each prescription and refill. Be sure to read this information carefully each time. Talk to your pediatrician regarding the use of this medicine in children. Special care may be needed. Overdosage: If you think you have taken too much of this medicine contact a poison control center or emergency room at once. NOTE: This medicine is only for you. Do not share this medicine with others. What if I miss a dose? If you miss a dose, skip the missed dose and take your next tablet at the regular time. Do not take  double or extra doses. What may interact with this medicine? Do not take this medicine with any of the following medications:  linezolid  MAOIs like Azilect, Carbex, Eldepryl,  Marplan, Nardil, and Parnate  methylene blue (injected into a vein)  other medicines that contain bupropion like Zyban This medicine may also interact with the following medications:  alcohol  certain medicines for anxiety or sleep  certain medicines for blood pressure like metoprolol, propranolol  certain medicines for depression or psychotic disturbances  certain medicines for HIV or AIDS like efavirenz, lopinavir, nelfinavir, ritonavir  certain medicines for irregular heart beat like propafenone, flecainide  certain medicines for Parkinson's disease like amantadine, levodopa  certain medicines for seizures like carbamazepine, phenytoin, phenobarbital  cimetidine  clopidogrel  cyclophosphamide  digoxin  furazolidone  isoniazid  nicotine  orphenadrine  procarbazine  steroid medicines like prednisone or cortisone  stimulant medicines for attention disorders, weight loss, or to stay awake  tamoxifen  theophylline  thiotepa  ticlopidine  tramadol  warfarin This list may not describe all possible interactions. Give your health care provider a list of all the medicines, herbs, non-prescription drugs, or dietary supplements you use. Also tell them if you smoke, drink alcohol, or use illegal drugs. Some items may interact with your medicine. What should I watch for while using this medicine? Tell your doctor if your symptoms do not get better or if they get worse. Visit your doctor or healthcare provider for regular checks on your progress. Because it may take several weeks to see the full effects of this medicine, it is important to continue your treatment as prescribed by your doctor. This medicine may cause serious skin reactions. They can happen weeks to months after starting the medicine. Contact your healthcare provider right away if you notice fevers or flu-like symptoms with a rash. The rash may be red or purple and then turn into blisters or peeling of  the skin. Or, you might notice a red rash with swelling of the face, lips or lymph nodes in your neck or under your arms. Patients and their families should watch out for new or worsening thoughts of suicide or depression. Also watch out for sudden changes in feelings such as feeling anxious, agitated, panicky, irritable, hostile, aggressive, impulsive, severely restless, overly excited and hyperactive, or not being able to sleep. If this happens, especially at the beginning of treatment or after a change in dose, call your healthcare provider. Avoid alcoholic drinks while taking this medicine. Drinking large amounts of alcoholic beverages, using sleeping or anxiety medicines, or quickly stopping the use of these agents while taking this medicine may increase your risk for a seizure. Do not drive or use heavy machinery until you know how this medicine affects you. This medicine can impair your ability to perform these tasks. Do not take this medicine close to bedtime. It may prevent you from sleeping. Your mouth may get dry. Chewing sugarless gum or sucking hard candy, and drinking plenty of water may help. Contact your doctor if the problem does not go away or is severe. The tablet shell for some brands of this medicine does not dissolve. This is normal. The tablet shell may appear whole in the stool. This is not a cause for concern. What side effects may I notice from receiving this medicine? Side effects that you should report to your doctor or health care professional as soon as possible:  allergic reactions like skin rash, itching or hives, swelling of the face, lips,  or tongue  breathing problems  changes in vision  confusion  elevated mood, decreased need for sleep, racing thoughts, impulsive behavior  fast or irregular heartbeat  hallucinations, loss of contact with reality  increased blood pressure  rash, fever, and swollen lymph nodes  redness, blistering, peeling or loosening  of the skin, including inside the mouth  seizures  suicidal thoughts or other mood changes  unusually weak or tired  vomiting Side effects that usually do not require medical attention (report to your doctor or health care professional if they continue or are bothersome):  constipation  headache  loss of appetite  nausea  tremors  weight loss This list may not describe all possible side effects. Call your doctor for medical advice about side effects. You may report side effects to FDA at 1-800-FDA-1088. Where should I keep my medicine? Keep out of the reach of children. Store at room temperature between 15 and 30 degrees C (59 and 86 degrees F). Throw away any unused medicine after the expiration date. NOTE: This sheet is a summary. It may not cover all possible information. If you have questions about this medicine, talk to your doctor, pharmacist, or health care provider.  2020 Elsevier/Gold Standard (2019-03-12 13:45:31) Hydroxyzine capsules or tablets What is this medicine? HYDROXYZINE (hye DROX i zeen) is an antihistamine. This medicine is used to treat allergy symptoms. It is also used to treat anxiety and tension. This medicine can be used with other medicines to induce sleep before surgery. This medicine may be used for other purposes; ask your health care provider or pharmacist if you have questions. COMMON BRAND NAME(S): ANX, Atarax, Rezine, Vistaril What should I tell my health care provider before I take this medicine? They need to know if you have any of these conditions:  glaucoma  heart disease  history of irregular heartbeat  kidney disease  liver disease  lung or breathing disease, like asthma  stomach or intestine problems  thyroid disease  trouble passing urine  an unusual or allergic reaction to hydroxyzine, cetirizine, other medicines, foods, dyes or preservatives  pregnant or trying to get pregnant  breast-feeding How should I use this  medicine? Take this medicine by mouth with a full glass of water. Follow the directions on the prescription label. You may take this medicine with food or on an empty stomach. Take your medicine at regular intervals. Do not take your medicine more often than directed. Talk to your pediatrician regarding the use of this medicine in children. Special care may be needed. While this drug may be prescribed for children as young as 25 years of age for selected conditions, precautions do apply. Patients over 52 years old may have a stronger reaction and need a smaller dose. Overdosage: If you think you have taken too much of this medicine contact a poison control center or emergency room at once. NOTE: This medicine is only for you. Do not share this medicine with others. What if I miss a dose? If you miss a dose, take it as soon as you can. If it is almost time for your next dose, take only that dose. Do not take double or extra doses. What may interact with this medicine? Do not take this medicine with any of the following medications:  cisapride  dronedarone  pimozide  thioridazine This medicine may also interact with the following medications:  alcohol  antihistamines for allergy, cough, and cold  atropine  barbiturate medicines for sleep or seizures, like phenobarbital  certain antibiotics like erythromycin or clarithromycin  certain medicines for anxiety or sleep  certain medicines for bladder problems like oxybutynin, tolterodine  certain medicines for depression or psychotic disturbances  certain medicines for irregular heart beat  certain medicines for Parkinson's disease like benztropine, trihexyphenidyl  certain medicines for seizures like phenobarbital, primidone  certain medicines for stomach problems like dicyclomine, hyoscyamine  certain medicines for travel sickness like scopolamine  ipratropium  narcotic medicines for pain  other medicines that prolong the  QT interval (an abnormal heart rhythm) like dofetilide This list may not describe all possible interactions. Give your health care provider a list of all the medicines, herbs, non-prescription drugs, or dietary supplements you use. Also tell them if you smoke, drink alcohol, or use illegal drugs. Some items may interact with your medicine. What should I watch for while using this medicine? Tell your doctor or health care professional if your symptoms do not improve. You may get drowsy or dizzy. Do not drive, use machinery, or do anything that needs mental alertness until you know how this medicine affects you. Do not stand or sit up quickly, especially if you are an older patient. This reduces the risk of dizzy or fainting spells. Alcohol may interfere with the effect of this medicine. Avoid alcoholic drinks. Your mouth may get dry. Chewing sugarless gum or sucking hard candy, and drinking plenty of water may help. Contact your doctor if the problem does not go away or is severe. This medicine may cause dry eyes and blurred vision. If you wear contact lenses you may feel some discomfort. Lubricating drops may help. See your eye doctor if the problem does not go away or is severe. If you are receiving skin tests for allergies, tell your doctor you are using this medicine. What side effects may I notice from receiving this medicine? Side effects that you should report to your doctor or health care professional as soon as possible:  allergic reactions like skin rash, itching or hives, swelling of the face, lips, or tongue  changes in vision  confusion  fast, irregular heartbeat  seizures  tremor  trouble passing urine or change in the amount of urine Side effects that usually do not require medical attention (report to your doctor or health care professional if they continue or are bothersome):  constipation  drowsiness  dry mouth  headache  tiredness This list may not describe all  possible side effects. Call your doctor for medical advice about side effects. You may report side effects to FDA at 1-800-FDA-1088. Where should I keep my medicine? Keep out of the reach of children. Store at room temperature between 15 and 30 degrees C (59 and 86 degrees F). Keep container tightly closed. Throw away any unused medicine after the expiration date. NOTE: This sheet is a summary. It may not cover all possible information. If you have questions about this medicine, talk to your doctor, pharmacist, or health care provider.  2020 Elsevier/Gold Standard (2018-12-08 13:19:55)   Calorie Counting for Weight Loss Calories are units of energy. Your body needs a certain amount of calories from food to keep you going throughout the day. When you eat more calories than your body needs, your body stores the extra calories as fat. When you eat fewer calories than your body needs, your body burns fat to get the energy it needs. Calorie counting means keeping track of how many calories you eat and drink each day. Calorie counting can be helpful if you  need to lose weight. If you make sure to eat fewer calories than your body needs, you should lose weight. Ask your health care provider what a healthy weight is for you. For calorie counting to work, you will need to eat the right number of calories in a day in order to lose a healthy amount of weight per week. A dietitian can help you determine how many calories you need in a day and will give you suggestions on how to reach your calorie goal.  A healthy amount of weight to lose per week is usually 1-2 lb (0.5-0.9 kg). This usually means that your daily calorie intake should be reduced by 500-750 calories.  Eating 1,200 - 1,500 calories per day can help most women lose weight.  Eating 1,500 - 1,800 calories per day can help most men lose weight. What is my plan? My goal is to have __________ calories per day. If I have this many calories per day, I  should lose around __________ pounds per week. What do I need to know about calorie counting? In order to meet your daily calorie goal, you will need to:  Find out how many calories are in each food you would like to eat. Try to do this before you eat.  Decide how much of the food you plan to eat.  Write down what you ate and how many calories it had. Doing this is called keeping a food log. To successfully lose weight, it is important to balance calorie counting with a healthy lifestyle that includes regular activity. Aim for 150 minutes of moderate exercise (such as walking) or 75 minutes of vigorous exercise (such as running) each week. Where do I find calorie information?  The number of calories in a food can be found on a Nutrition Facts label. If a food does not have a Nutrition Facts label, try to look up the calories online or ask your dietitian for help. Remember that calories are listed per serving. If you choose to have more than one serving of a food, you will have to multiply the calories per serving by the amount of servings you plan to eat. For example, the label on a package of bread might say that a serving size is 1 slice and that there are 90 calories in a serving. If you eat 1 slice, you will have eaten 90 calories. If you eat 2 slices, you will have eaten 180 calories. How do I keep a food log? Immediately after each meal, record the following information in your food log:  What you ate. Don't forget to include toppings, sauces, and other extras on the food.  How much you ate. This can be measured in cups, ounces, or number of items.  How many calories each food and drink had.  The total number of calories in the meal. Keep your food log near you, such as in a small notebook in your pocket, or use a mobile app or website. Some programs will calculate calories for you and show you how many calories you have left for the day to meet your goal. What are some calorie  counting tips?   Use your calories on foods and drinks that will fill you up and not leave you hungry: ? Some examples of foods that fill you up are nuts and nut butters, vegetables, lean proteins, and high-fiber foods like whole grains. High-fiber foods are foods with more than 5 g fiber per serving. ? Drinks such as sodas,  specialty coffee drinks, alcohol, and juices have a lot of calories, yet do not fill you up.  Eat nutritious foods and avoid empty calories. Empty calories are calories you get from foods or beverages that do not have many vitamins or protein, such as candy, sweets, and soda. It is better to have a nutritious high-calorie food (such as an avocado) than a food with few nutrients (such as a bag of chips).  Know how many calories are in the foods you eat most often. This will help you calculate calorie counts faster.  Pay attention to calories in drinks. Low-calorie drinks include water and unsweetened drinks.  Pay attention to nutrition labels for "low fat" or "fat free" foods. These foods sometimes have the same amount of calories or more calories than the full fat versions. They also often have added sugar, starch, or salt, to make up for flavor that was removed with the fat.  Find a way of tracking calories that works for you. Get creative. Try different apps or programs if writing down calories does not work for you. What are some portion control tips?  Know how many calories are in a serving. This will help you know how many servings of a certain food you can have.  Use a measuring cup to measure serving sizes. You could also try weighing out portions on a kitchen scale. With time, you will be able to estimate serving sizes for some foods.  Take some time to put servings of different foods on your favorite plates, bowls, and cups so you know what a serving looks like.  Try not to eat straight from a bag or box. Doing this can lead to overeating. Put the amount you  would like to eat in a cup or on a plate to make sure you are eating the right portion.  Use smaller plates, glasses, and bowls to prevent overeating.  Try not to multitask (for example, watch TV or use your computer) while eating. If it is time to eat, sit down at a table and enjoy your food. This will help you to know when you are full. It will also help you to be aware of what you are eating and how much you are eating. What are tips for following this plan? Reading food labels  Check the calorie count compared to the serving size. The serving size may be smaller than what you are used to eating.  Check the source of the calories. Make sure the food you are eating is high in vitamins and protein and low in saturated and trans fats. Shopping  Read nutrition labels while you shop. This will help you make healthy decisions before you decide to purchase your food.  Make a grocery list and stick to it. Cooking  Try to cook your favorite foods in a healthier way. For example, try baking instead of frying.  Use low-fat dairy products. Meal planning  Use more fruits and vegetables. Half of your plate should be fruits and vegetables.  Include lean proteins like poultry and fish. How do I count calories when eating out?  Ask for smaller portion sizes.  Consider sharing an entree and sides instead of getting your own entree.  If you get your own entree, eat only half. Ask for a box at the beginning of your meal and put the rest of your entree in it so you are not tempted to eat it.  If calories are listed on the menu, choose the lower  calorie options.  Choose dishes that include vegetables, fruits, whole grains, low-fat dairy products, and lean protein.  Choose items that are boiled, broiled, grilled, or steamed. Stay away from items that are buttered, battered, fried, or served with cream sauce. Items labeled "crispy" are usually fried, unless stated otherwise.  Choose water, low-fat  milk, unsweetened iced tea, or other drinks without added sugar. If you want an alcoholic beverage, choose a lower calorie option such as a glass of wine or light beer.  Ask for dressings, sauces, and syrups on the side. These are usually high in calories, so you should limit the amount you eat.  If you want a salad, choose a garden salad and ask for grilled meats. Avoid extra toppings like bacon, cheese, or fried items. Ask for the dressing on the side, or ask for olive oil and vinegar or lemon to use as dressing.  Estimate how many servings of a food you are given. For example, a serving of cooked rice is  cup or about the size of half a baseball. Knowing serving sizes will help you be aware of how much food you are eating at restaurants. The list below tells you how big or small some common portion sizes are based on everyday objects: ? 1 oz--4 stacked dice. ? 3 oz--1 deck of cards. ? 1 tsp--1 die. ? 1 Tbsp-- a ping-pong ball. ? 2 Tbsp--1 ping-pong ball. ?  cup-- baseball. ? 1 cup--1 baseball. Summary  Calorie counting means keeping track of how many calories you eat and drink each day. If you eat fewer calories than your body needs, you should lose weight.  A healthy amount of weight to lose per week is usually 1-2 lb (0.5-0.9 kg). This usually means reducing your daily calorie intake by 500-750 calories.  The number of calories in a food can be found on a Nutrition Facts label. If a food does not have a Nutrition Facts label, try to look up the calories online or ask your dietitian for help.  Use your calories on foods and drinks that will fill you up, and not on foods and drinks that will leave you hungry.  Use smaller plates, glasses, and bowls to prevent overeating. This information is not intended to replace advice given to you by your health care provider. Make sure you discuss any questions you have with your health care provider. Document Revised: 09/05/2018 Document  Reviewed: 11/16/2016 Elsevier Patient Education  2020 Elsevier Inc.   Fat and Cholesterol Restricted Eating Plan Getting too much fat and cholesterol in your diet may cause health problems. Choosing the right foods helps keep your fat and cholesterol at normal levels. This can keep you from getting certain diseases. Your doctor may recommend an eating plan that includes:  Total fat: ______% or less of total calories a day.  Saturated fat: ______% or less of total calories a day.  Cholesterol: less than _________mg a day.  Fiber: ______g a day. What are tips for following this plan? Meal planning  At meals, divide your plate into four equal parts: ? Fill one-half of your plate with vegetables and green salads. ? Fill one-fourth of your plate with whole grains. ? Fill one-fourth of your plate with low-fat (lean) protein foods.  Eat fish that is high in omega-3 fats at least two times a week. This includes mackerel, tuna, sardines, and salmon.  Eat foods that are high in fiber, such as whole grains, beans, apples, broccoli, carrots, peas, and barley.  General tips   Work with your doctor to lose weight if you need to.  Avoid: ? Foods with added sugar. ? Fried foods. ? Foods with partially hydrogenated oils.  Limit alcohol intake to no more than 1 drink a day for nonpregnant women and 2 drinks a day for men. One drink equals 12 oz of beer, 5 oz of wine, or 1 oz of hard liquor. Reading food labels  Check food labels for: ? Trans fats. ? Partially hydrogenated oils. ? Saturated fat (g) in each serving. ? Cholesterol (mg) in each serving. ? Fiber (g) in each serving.  Choose foods with healthy fats, such as: ? Monounsaturated fats. ? Polyunsaturated fats. ? Omega-3 fats.  Choose grain products that have whole grains. Look for the word "whole" as the first word in the ingredient list. Cooking  Cook foods using low-fat methods. These include baking, boiling, grilling,  and broiling.  Eat more home-cooked foods. Eat at restaurants and buffets less often.  Avoid cooking using saturated fats, such as butter, cream, palm oil, palm kernel oil, and coconut oil. Recommended foods  Fruits  All fresh, canned (in natural juice), or frozen fruits. Vegetables  Fresh or frozen vegetables (raw, steamed, roasted, or grilled). Green salads. Grains  Whole grains, such as whole wheat or whole grain breads, crackers, cereals, and pasta. Unsweetened oatmeal, bulgur, barley, quinoa, or brown rice. Corn or whole wheat flour tortillas. Meats and other protein foods  Ground beef (85% or leaner), grass-fed beef, or beef trimmed of fat. Skinless chicken or [place]. Ground chicken or [place]. Pork trimmed of fat. All fish and seafood. Egg whites. Dried beans, peas, or lentils. Unsalted nuts or seeds. Unsalted canned beans. Nut butters without added sugar or oil. Dairy  Low-fat or nonfat dairy products, such as skim or 1% milk, 2% or reduced-fat cheeses, low-fat and fat-free ricotta or cottage cheese, or plain low-fat and nonfat yogurt. Fats and oils  Tub margarine without trans fats. Light or reduced-fat mayonnaise and salad dressings. Avocado. Olive, canola, sesame, or safflower oils. The items listed above may not be a complete list of foods and beverages you can eat. Contact a dietitian for more information. Foods to avoid Fruits  Canned fruit in heavy syrup. Fruit in cream or butter sauce. Fried fruit. Vegetables  Vegetables cooked in cheese, cream, or butter sauce. Fried vegetables. Grains  White bread. White pasta. White rice. Cornbread. Bagels, pastries, and croissants. Crackers and snack foods that contain trans fat and hydrogenated oils. Meats and other protein foods  Fatty cuts of meat. Ribs, chicken wings, bacon, sausage, bologna, salami, chitterlings, fatback, hot dogs, bratwurst, and packaged lunch meats. Liver and organ meats. Whole eggs and egg yolks.  Chicken and [place] with skin. Fried meat. Dairy  Whole or 2% milk, cream, half-and-half, and cream cheese. Whole milk cheeses. Whole-fat or sweetened yogurt. Full-fat cheeses. Nondairy creamers and whipped toppings. Processed cheese, cheese spreads, and cheese curds. Beverages  Alcohol. Sugar-sweetened drinks such as sodas, lemonade, and fruit drinks. Fats and oils  Butter, stick margarine, lard, shortening, ghee, or bacon fat. Coconut, palm kernel, and palm oils. Sweets and desserts  Corn syrup, sugars, honey, and molasses. Candy. Jam and jelly. Syrup. Sweetened cereals. Cookies, pies, cakes, donuts, muffins, and ice cream. The items listed above may not be a complete list of foods and beverages you should avoid. Contact a dietitian for more information. Summary  Choosing the right foods helps keep your fat and cholesterol at normal levels. This can  keep you from getting certain diseases.  At meals, fill one-half of your plate with vegetables and green salads.  Eat high-fiber foods, like whole grains, beans, apples, carrots, peas, and barley.  Limit added sugar, saturated fats, alcohol, and fried foods. This information is not intended to replace advice given to you by your health care provider. Make sure you discuss any questions you have with your health care provider. Document Revised: 08/20/2018 Document Reviewed: 09/03/2017 Elsevier Patient Education  [place].

## 2020-07-29 NOTE — Progress Notes (Signed)
Negative pregnancy test. Urine microscopic within normal limits.

## 2020-07-30 LAB — TSH: TSH: 13 u[IU]/mL — ABNORMAL HIGH (ref 0.450–4.500)

## 2020-07-30 LAB — VITAMIN D 25 HYDROXY (VIT D DEFICIENCY, FRACTURES): Vit D, 25-Hydroxy: 27.7 ng/mL — ABNORMAL LOW (ref 30.0–100.0)

## 2020-08-01 ENCOUNTER — Other Ambulatory Visit: Payer: Self-pay | Admitting: Adult Health

## 2020-08-01 ENCOUNTER — Telehealth: Payer: Self-pay

## 2020-08-01 DIAGNOSIS — E559 Vitamin D deficiency, unspecified: Secondary | ICD-10-CM

## 2020-08-01 DIAGNOSIS — E039 Hypothyroidism, unspecified: Secondary | ICD-10-CM

## 2020-08-01 MED ORDER — VITAMIN D (ERGOCALCIFEROL) 1.25 MG (50000 UNIT) PO CAPS: 50000.0000 [IU] | ORAL_CAPSULE | ORAL | 0 refills | Status: DC

## 2020-08-01 MED ORDER — LEVOTHYROXINE SODIUM 100 MCG PO TABS: 100.0000 ug | ORAL_TABLET | Freq: Every day | ORAL | 0 refills | Status: DC

## 2020-08-01 NOTE — Telephone Encounter (Signed)
-----   Message from Berniece Pap, FNP sent at 08/01/2020 11:49 AM EDT ----- TSH improved with Synthroid still not under control, will increase Synthroid to take one tablet by mouth once daily not within 2 hours of any other medication.  Vitamin D is still low, will refill prescription Vitamin D the once weekly dose for another 10 weeks. Provider sent medications to pharmacy.   Recheck TSH in 2 months as well as Vitamin D. Ordered labs please for future around 10/01/20

## 2020-08-01 NOTE — Progress Notes (Signed)
Meds ordered this encounter  Medications  . Vitamin D, Ergocalciferol, (DRISDOL) 1.25 MG (50000 UNIT) CAPS capsule    Sig: Take 1 capsule (50,000 Units total) by mouth every 7 (seven) days. For total of 12 weeks. Take ONE tablet ONCE weekly by mouth    Dispense:  10 capsule    Refill:  0  . levothyroxine (SYNTHROID) 100 MCG tablet    Sig: Take 1 tablet (100 mcg total) by mouth daily.    Dispense:  90 tablet    Refill:  0  recheck Vitamin D and TSH around 10/01/20

## 2020-08-01 NOTE — Progress Notes (Signed)
TSH improved with Synthroid still not under control, will increase Synthroid to  take one tablet by mouth once daily not within 2 hours of any other medication.  Vitamin D is still low, will refill prescription Vitamin D the once weekly dose for another 10 weeks. Provider sent medications to pharmacy.   Recheck TSH in 2 months as well as Vitamin  D. Ordered labs please for future around 10/01/20

## 2020-08-01 NOTE — Telephone Encounter (Signed)
Written by Berniece Pap, FNP on 08/01/2020 8:17 AM EDT  Seen by patient Carolyn Bennett on 08/01/2020 8:18 AM

## 2020-08-01 NOTE — Telephone Encounter (Signed)
Written by Berniece Pap, FNP on 08/01/2020 8:17 AM EDT View Full Comments Seen by patient [name] on 08/01/2020 8:18 AM

## 2020-08-01 NOTE — Telephone Encounter (Signed)
-----   Message from Berniece Pap, FNP sent at 07/29/2020 10:42 AM EDT ----- Negative pregnancy test. Urine microscopic within normal limits.

## 2020-08-20 ENCOUNTER — Other Ambulatory Visit: Payer: Self-pay | Admitting: Adult Health

## 2020-08-20 DIAGNOSIS — F418 Other specified anxiety disorders: Secondary | ICD-10-CM

## 2020-08-20 NOTE — Telephone Encounter (Signed)
Requested Prescriptions  Pending Prescriptions Disp Refills  . hydrOXYzine (ATARAX/VISTARIL) 50 MG tablet [Pharmacy Med Name: HYDROXYZINE HCL 50 MG TABLET] 90 tablet 0    Sig: TAKE 1 TABLET (50 MG TOTAL) BY MOUTH 3 (THREE) TIMES DAILY AS NEEDED (MAY CAUSE DROWSINESS).     Ear, Nose, and Throat:  Antihistamines Passed - 08/20/2020  9:15 AM      Passed - Valid encounter within last 12 months    Recent Outpatient Visits          3 weeks ago Depression with anxiety   [facility] Flinchum, Eula Fried, FNP   5 months ago Encounter for surveillance of injectable contraceptive   [facility] Flinchum, Eula Fried, FNP   6 months ago Routine health maintenance    [facility] Eula Fried, FNP      Future Appointments            In 1 week Flinchum, Eula Fried, FNP [facility]

## 2020-08-29 ENCOUNTER — Ambulatory Visit: Payer: Self-pay | Admitting: Adult Health

## 2020-09-13 ENCOUNTER — Ambulatory Visit: Payer: Self-pay | Admitting: Adult Health

## 2020-09-13 ENCOUNTER — Encounter: Payer: Self-pay | Admitting: Adult Health

## 2020-09-15 DIAGNOSIS — Z03818 Encounter for observation for suspected exposure to other biological agents ruled out: Secondary | ICD-10-CM | POA: Diagnosis not present

## 2020-09-16 ENCOUNTER — Encounter: Payer: Self-pay | Admitting: Adult Health

## 2020-09-17 ENCOUNTER — Encounter: Payer: Self-pay | Admitting: Adult Health

## 2020-09-20 ENCOUNTER — Ambulatory Visit: Payer: Self-pay | Admitting: Adult Health

## 2020-09-21 ENCOUNTER — Telehealth (INDEPENDENT_AMBULATORY_CARE_PROVIDER_SITE_OTHER): Payer: Medicaid Other | Admitting: Adult Health

## 2020-09-21 ENCOUNTER — Encounter: Payer: Self-pay | Admitting: Adult Health

## 2020-09-21 DIAGNOSIS — F418 Other specified anxiety disorders: Secondary | ICD-10-CM | POA: Diagnosis not present

## 2020-09-21 DIAGNOSIS — J0140 Acute pansinusitis, unspecified: Secondary | ICD-10-CM | POA: Diagnosis not present

## 2020-09-21 DIAGNOSIS — U071 COVID-19: Secondary | ICD-10-CM

## 2020-09-21 DIAGNOSIS — J039 Acute tonsillitis, unspecified: Secondary | ICD-10-CM

## 2020-09-21 MED ORDER — FLUTICASONE PROPIONATE 50 MCG/ACT NA SUSP
2.0000 | Freq: Every day | NASAL | 6 refills | Status: DC
Start: 2020-09-21 — End: 2021-11-01

## 2020-09-21 MED ORDER — AMOXICILLIN-POT CLAVULANATE 875-125 MG PO TABS: 1.0000 | ORAL_TABLET | Freq: Two times a day (BID) | ORAL | 0 refills | Status: DC

## 2020-09-21 MED ORDER — PREDNISONE 10 MG (21) PO TBPK: ORAL_TABLET | ORAL | 0 refills | Status: DC

## 2020-09-21 MED ORDER — GUAIFENESIN ER 600 MG PO TB12: 1200.0000 mg | ORAL_TABLET | Freq: Two times a day (BID) | ORAL | 0 refills | Status: DC

## 2020-09-21 NOTE — Progress Notes (Signed)
MyChart Video Visit    Virtual Visit via Video Note   This visit type was conducted due to national recommendations for restrictions regarding the COVID-19 Pandemic (e.g. social distancing) in an effort to limit this patient's exposure and mitigate transmission in our community. This patient is at least at moderate risk for complications without adequate follow up. This format is felt to be most appropriate for this patient at this time. Physical exam was limited by quality of the video and audio technology used for the visit.   Patient location: at home  Provider location: Provider: Provider's office at  [facility], [place].     I discussed the limitations of evaluation and management by telemedicine and the availability of in person appointments. The patient expressed understanding and agreed to proceed.  Patient: Carolyn Bennett   DOB: [birth year]   29 y.o. Female  MRN: [id number] Visit Date: 09/21/2020  Today's healthcare provider: Jairo Ben, FNP   Chief Complaint  Patient presents with  . Headache   Subjective    Headache  This is a new problem. The current episode started in the past 7 days (patient tested postivie for Covid 19 09/08/20). The problem occurs daily. The problem has been unchanged. The pain quality is not similar to prior headaches. Associated symptoms include coughing, rhinorrhea, sinus pressure and a sore throat (right side). Pertinent negatives include no abdominal pain, abnormal behavior, anorexia, back pain, blurred vision, dizziness, drainage, ear pain, eye pain, eye redness, eye watering, facial sweating, fever, hearing loss, insomnia, loss of balance, muscle aches, nausea, neck pain, numbness, phonophobia, photophobia, scalp tenderness, seizures, swollen glands, tingling, tinnitus, visual change, vomiting, weakness or weight loss. Nothing aggravates the symptoms. She has tried NSAIDs and acetaminophen for the symptoms. The  treatment provided mild relief.    She tested positive on Thursday of last week 09/15/20. Onset 09/09/20 denies any difficulty breathing or distress. No pulse oximetry.  She tested positive.  Her kids are sick but were negative.  She has had sinus pressure and headache. She has had sinus pressure behind her right eye. She has nasal congestion with some mild bleeding. Sore throat, pain on right side of throat. Mild tonisl swelling on the right she reports. She is coughing some however headache and sinus pressure is moderate and worsening.  She has nasal discharge that is heavy. No relief with over the counter medications for sinus pressure.   She is using Ibuprofen and tylenol alternation.   She has no smell.  She has no taste.   Patient  denies any fever, body aches,chills, rash, chest pain, shortness of breath, nausea, vomiting, or diarrhea.    Patient Active Problem List   Diagnosis Date Noted  . COVID-19 09/21/2020  . Acute non-recurrent pansinusitis 09/21/2020  . Body mass index (BMI) of 33.0-33.9 in adult 07/29/2020  . Stress due to family tension 06/02/2020  . Encounter for surveillance of injectable contraceptive 02/26/2020  . Screening for cervical cancer 02/26/2020  . Vitamin D deficiency 02/26/2020  . Elevated liver enzymes 02/26/2020  . Screening for blood or protein in urine 01/26/2020  . Fever and chills 01/26/2020  . Wheezing 01/26/2020  . Depression with anxiety 01/26/2020  . Anxiety 01/26/2020  . Suspected COVID-19 virus infection 01/26/2020  . History of hypothyroidism 01/26/2020  . Hypothyroidism 10/14/2018  . Hyperlipidemia 10/14/2018  . Hyperglycemia 10/14/2018  . Chronic tonsillitis 01/26/2016   Past Medical History:  Diagnosis Date  . Anxiety   .  Depression   . Preeclampsia   . Thyroid disease    Past Surgical History:  Procedure Laterality Date  . CESAREAN SECTION N/A 05/22/2017   Procedure: STAT CESAREAN SECTION;  Surgeon: Christeen Douglas, MD;   Location: [facility] ORS;  Service: Obstetrics;  Laterality: N/A;  . VAGINAL SEPTOPLASTY  05/22/2017   Procedure: VAGINAL Laceration repair;  Surgeon: Christeen Douglas, MD;  Location: [facility] ORS;  Service: Obstetrics;;   Social History   Tobacco Use  . Smoking status: Former Games developer  . Smokeless tobacco: Never Used  Substance Use Topics  . Alcohol use: No  . Drug use: No   Social History   Socioeconomic History  . Marital status: Married    Spouse name: Not on file  . Number of children: Not on file  . Years of education: Not on file  . Highest education level: Not on file  Occupational History  . Not on file  Tobacco Use  . Smoking status: Former Games developer  . Smokeless tobacco: Never Used  Substance and Sexual Activity  . Alcohol use: No  . Drug use: No  . Sexual activity: Yes    Birth control/protection: Injection  Other Topics Concern  . Not on file  Social History Narrative  . Not on file   Social Determinants of Health   Financial Resource Strain:   . Difficulty of Paying Living Expenses: Not on file  Food Insecurity:   . Worried About Programme researcher, broadcasting/film/video in the Last Year: Not on file  . Ran Out of Food in the Last Year: Not on file  Transportation Needs:   . Lack of Transportation (Medical): Not on file  . Lack of Transportation (Non-Medical): Not on file  Physical Activity:   . Days of Exercise per Week: Not on file  . Minutes of Exercise per Session: Not on file  Stress:   . Feeling of Stress : Not on file  Social Connections:   . Frequency of Communication with Friends and Family: Not on file  . Frequency of Social Gatherings with Friends and Family: Not on file  . Attends Religious Services: Not on file  . Active Member of Clubs or Organizations: Not on file  . Attends Banker Meetings: Not on file  . Marital Status: Not on file  Intimate Partner Violence:   . Fear of Current or Ex-Partner: Not on file  . Emotionally Abused: Not on file  .  Physically Abused: Not on file  . Sexually Abused: Not on file   Family Status  Relation Name Status  . Father  (Not Specified)  . MGM  (Not Specified)   Family History  Problem Relation Age of Onset  . Diabetes Father   . Cancer Father   . Cancer Maternal Grandmother   . Diabetes Maternal Grandmother    No Known Allergies    Medications: Outpatient Medications Prior to Visit  Medication Sig  . albuterol (VENTOLIN HFA) 108 (90 Base) MCG/ACT inhaler Inhale 1-2 puffs into the lungs every 6 (six) hours as needed for wheezing or shortness of breath.  Marland Kitchen buPROPion (WELLBUTRIN XL) 300 MG 24 hr tablet Take 1 tablet (300 mg total) by mouth daily.  . cyclobenzaprine (FLEXERIL) 10 MG tablet Take 1 tablet (10 mg total) by mouth 3 (three) times daily as needed for muscle spasms (follow up advised if not improving at anytime.).  Marland Kitchen hydrOXYzine (ATARAX/VISTARIL) 50 MG tablet TAKE 1 TABLET (50 MG TOTAL) BY MOUTH 3 (THREE) TIMES DAILY AS  NEEDED (MAY CAUSE DROWSINESS).  Marland Kitchen levothyroxine (SYNTHROID) 100 MCG tablet Take 1 tablet (100 mcg total) by mouth daily.  . medroxyPROGESTERone (DEPO-PROVERA) 150 MG/ML injection Inject 1 mL (150 mg total) into the muscle every 3 (three) months.  . Vitamin D, Ergocalciferol, (DRISDOL) 1.25 MG (50000 UNIT) CAPS capsule Take 1 capsule (50,000 Units total) by mouth every 7 (seven) days. For total of 12 weeks. Take ONE tablet ONCE weekly by mouth   No facility-administered medications prior to visit.    Review of Systems  Constitutional: Negative for fever and weight loss.  HENT: Positive for rhinorrhea, sinus pressure and sore throat (right side). Negative for ear pain, hearing loss and tinnitus.   Eyes: Negative for blurred vision, photophobia, pain and redness.  Respiratory: Positive for cough.   Gastrointestinal: Negative for abdominal pain, anorexia, nausea and vomiting.  Musculoskeletal: Negative for back pain and neck pain.  Neurological: Positive for  headaches. Negative for dizziness, tingling, seizures, weakness, numbness and loss of balance.  Psychiatric/Behavioral: The patient does not have insomnia.     Last CBC Lab Results  Component Value Date   WBC 6.4 01/26/2020   HGB 13.9 01/26/2020   HCT 42.8 01/26/2020   MCV 87.0 01/26/2020   MCH 28.3 01/26/2020   RDW 11.9 01/26/2020   PLT 268 01/26/2020   Last metabolic panel Lab Results  Component Value Date   GLUCOSE 89 05/13/2020   NA 137 05/13/2020   K 4.7 05/13/2020   CL 101 05/13/2020   CO2 19 (L) 05/13/2020   BUN 11 05/13/2020   CREATININE 0.87 05/13/2020   GFRNONAA 90 05/13/2020   GFRAA 103 05/13/2020   CALCIUM 9.5 05/13/2020   PROT 7.1 05/13/2020   ALBUMIN 4.6 05/13/2020   LABGLOB 2.5 05/13/2020   AGRATIO 1.8 05/13/2020   BILITOT 0.2 05/13/2020   ALKPHOS 94 05/13/2020   AST 10 05/13/2020   ALT 12 05/13/2020   ANIONGAP 10 01/26/2020   Last lipids Lab Results  Component Value Date   CHOL 200 01/26/2020   HDL 39 (L) 01/26/2020   LDLCALC 147 (H) 01/26/2020   TRIG 71 01/26/2020   CHOLHDL 5.1 01/26/2020   Last hemoglobin A1c No results found for: HGBA1C Last thyroid functions Lab Results  Component Value Date   TSH 13.000 (H) 07/29/2020   Last vitamin D Lab Results  Component Value Date   VD25OH 27.7 (L) 07/29/2020   Last vitamin B12 and Folate No results found for: VITAMINB12, FOLATE    Objective    There were no vitals taken for this visit.   Denies any fever no other vitals available .  Physical Exam    Patient is alert and oriented and responsive to questions Engages in conversation with provider. Speaks in full sentences without any pauses without any shortness of breath or distress.   Assessment & Plan      1. Acute non-recurrent pansinusitis Will treat given duration to cover bacterial infection.   Meds ordered this encounter  Medications  . amoxicillin-clavulanate (AUGMENTIN) 875-125 MG tablet    Sig: Take 1 tablet by mouth 2  (two) times daily.    Dispense:  20 tablet    Refill:  0  . fluticasone (FLONASE) 50 MCG/ACT nasal spray    Sig: Place 2 sprays into both nostrils daily.    Dispense:  16 g    Refill:  6  . predniSONE (STERAPRED UNI-PAK 21 TAB) 10 MG (21) TBPK tablet    Sig: PO: Take 6 tablets  on day 1:Take 5 tablets day 2:Take 4 tablets day 3: Take 3 tablets day 4:Take 2 tablets day five: 5 Take 1 tablet day 6    Dispense:  21 tablet    Refill:  0  . guaiFENesin (MUCINEX) 600 MG 12 hr tablet    Sig: Take 2 tablets (1,200 mg total) by mouth 2 (two) times daily.    Dispense:  28 tablet    Refill:  0    2. COVID-19 Rest, fluids, hydration.   3. Tonsillitis As above treatment to cover bacterial infection possible given duration. Red flags discussed she has no difficulty swallowing.   4. Depression with anxiety She reports wellbutrin XL and Atarax as prescribed is working very well for her anxiety and she is pleased. Will continue current treatment, give PHQ9 and GAD 7 at next visit. She denies any worsening symptoms or any suicidal or homicidal ideations or intents.     Return if symptoms worsen or fail to improve, for at any time for any worsening symptoms, Go to Emergency room/ urgent care if worse.     I discussed the assessment and treatment plan with the patient. The patient was provided an opportunity to ask questions and all were answered. The patient agreed with the plan and demonstrated an understanding of the instructions.   The patient was advised to call back or seek an in-person evaluation if the symptoms worsen or if the condition fails to improve as anticipated.  I provided 35 minutes of non-face-to-face time during this encounter.    Jairo BenMichelle Smith Paityn Balsam, FNP [facility] [phone] (phone) [phone] (fax)  [facility]

## 2020-09-21 NOTE — Patient Instructions (Signed)
COVID-19 Frequently Asked Questions COVID-19 (coronavirus disease) is an infection that is caused by a large family of viruses. Some viruses cause illness in people and others cause illness in animals like camels, cats, and bats. In some cases, the viruses that cause illness in animals can spread to humans. Where did the coronavirus come from? In December 2019, [place] told the World Health Organization (WHO) of several cases of lung disease (human respiratory illness). These cases were linked to an open seafood and livestock market in the city of [place]. The link to the seafood and livestock market suggests that the virus may have spread from animals to humans. However, since that first outbreak in December, the virus has also been shown to spread from person to person. What is the name of the disease and the virus? Disease name Early on, this disease was called novel coronavirus. This is because scientists determined that the disease was caused by a new (novel) respiratory virus. The World Health Organization (WHO) has now named the disease COVID-19, or coronavirus disease. Virus name The virus that causes the disease is called severe acute respiratory syndrome coronavirus 2 (SARS-CoV-2). More information on disease and virus naming World Health Organization (WHO): www.who.int/emergencies/diseases/novel-coronavirus-2019/technical-guidance/naming-the-coronavirus-disease-(covid-2019)-and-the-virus-that-causes-it Who is at risk for complications from coronavirus disease? Some people may be at higher risk for complications from coronavirus disease. This includes older adults and people who have chronic diseases, such as heart disease, diabetes, and lung disease. If you are at higher risk for complications, take these extra precautions:  Stay home as much as possible.  Avoid social gatherings and travel.  Avoid close contact with others. Stay at least 6 ft (2 m) away from others, if possible.  Wash  your hands often with soap and water for at least 20 seconds.  Avoid touching your face, mouth, nose, or eyes.  Keep supplies on hand at home, such as food, medicine, and cleaning supplies.  If you must go out in public, wear a cloth face covering or face mask. Make sure your mask covers your nose and mouth. How does coronavirus disease spread? The virus that causes coronavirus disease spreads easily from person to person (is contagious). You may catch the virus by:  Breathing in droplets from an infected person. Droplets can be spread by a person breathing, speaking, singing, coughing, or sneezing.  Touching something, like a table or a doorknob, that was exposed to the virus (contaminated) and then touching your mouth, nose, or eyes. Can I get the virus from touching surfaces or objects? There is still a lot that we do not know about the virus that causes coronavirus disease. Scientists are basing a lot of information on what they know about similar viruses, such as:  Viruses cannot generally survive on surfaces for long. They need a human body (host) to survive.  It is more likely that the virus is spread by close contact with people who are sick (direct contact), such as through: ? Shaking hands or hugging. ? Breathing in respiratory droplets that travel through the air. Droplets can be spread by a person breathing, speaking, singing, coughing, or sneezing.  It is less likely that the virus is spread when a person touches a surface or object that has the virus on it (indirect contact). The virus may be able to enter the body if the person touches a surface or object and then touches his or her face, eyes, nose, or mouth. Can a person spread the virus without having symptoms of the disease?   It may be possible for the virus to spread before a person has symptoms of the disease, but this is most likely not the main way the virus is spreading. It is more likely for the virus to spread by  being in close contact with people who are sick and breathing in the respiratory droplets spread by a person breathing, speaking, singing, coughing, or sneezing. What are the symptoms of coronavirus disease? Symptoms vary from person to person and can range from mild to severe. Symptoms may include:  Fever or chills.  Cough.  Difficulty breathing or feeling short of breath.  Headaches, body aches, or muscle aches.  Runny or stuffy (congested) nose.  Sore throat.  New loss of taste or smell.  Nausea, vomiting, or diarrhea. These symptoms can appear anywhere from 2 to 14 days after you have been exposed to the virus. Some people may not have any symptoms. If you develop symptoms, call your health care provider. People with severe symptoms may need hospital care. Should I be tested for this virus? Your health care provider will decide whether to test you based on your symptoms, history of exposure, and your risk factors. How does a health care provider test for this virus? Health care providers will collect samples to send for testing. Samples may include:  Taking a swab of fluid from the back of your nose and throat, your nose, or your throat.  Taking fluid from the lungs by having you cough up mucus (sputum) into a sterile cup.  Taking a blood sample. Is there a treatment or vaccine for this virus? Currently, there is no vaccine to prevent coronavirus disease. Also, there are no medicines like antibiotics or antivirals to treat the virus. A person who becomes sick is given supportive care, which means rest and fluids. A person may also relieve his or her symptoms by using over-the-counter medicines that treat sneezing, coughing, and runny nose. These are the same medicines that a person takes for the common cold. If you develop symptoms, call your health care provider. People with severe symptoms may need hospital care. What can I do to protect myself and my family from this  virus?     You can protect yourself and your family by taking the same actions that you would take to prevent the spread of other viruses. Take the following actions:  Wash your hands often with soap and water for at least 20 seconds. If soap and water are not available, use alcohol-based hand sanitizer.  Avoid touching your face, mouth, nose, or eyes.  Cough or sneeze into a tissue, sleeve, or elbow. Do not cough or sneeze into your hand or the air. ? If you cough or sneeze into a tissue, throw it away immediately and wash your hands.  Disinfect objects and surfaces that you frequently touch every day.  Stay away from people who are sick.  Avoid going out in public, follow guidance from your state and local health authorities.  Avoid crowded indoor spaces. Stay at least 6 ft (2 m) away from others.  If you must go out in public, wear a cloth face covering or face mask. Make sure your mask covers your nose and mouth.  Stay home if you are sick, except to get medical care. Call your health care provider before you get medical care. Your health care provider will tell you how long to stay home.  Make sure your vaccines are up to date. Ask your health care provider what   vaccines you need. What should I do if I need to travel? Follow travel recommendations from your local health authority, the CDC, and WHO. Travel information and advice  Centers for Disease Control and Prevention (CDC): www.cdc.gov/coronavirus/2019-ncov/travelers/index.html  World Health Organization (WHO): www.who.int/emergencies/diseases/novel-coronavirus-2019/travel-advice Know the risks and take action to protect your health  You are at higher risk of getting coronavirus disease if you are traveling to areas with an outbreak or if you are exposed to travelers from areas with an outbreak.  Wash your hands often and practice good hygiene to lower the risk of catching or spreading the virus. What should I do if I  am sick? General instructions to stop the spread of infection  Wash your hands often with soap and water for at least 20 seconds. If soap and water are not available, use alcohol-based hand sanitizer.  Cough or sneeze into a tissue, sleeve, or elbow. Do not cough or sneeze into your hand or the air.  If you cough or sneeze into a tissue, throw it away immediately and wash your hands.  Stay home unless you must get medical care. Call your health care provider or local health authority before you get medical care.  Avoid public areas. Do not take public transportation, if possible.  If you can, wear a mask if you must go out of the house or if you are in close contact with someone who is not sick. Make sure your mask covers your nose and mouth. Keep your home clean  Disinfect objects and surfaces that are frequently touched every day. This may include: ? Counters and tables. ? Doorknobs and light switches. ? Sinks and faucets. ? Electronics such as phones, remote controls, keyboards, computers, and tablets.  Wash dishes in hot, soapy water or use a dishwasher. Air-dry your dishes.  Wash laundry in hot water. Prevent infecting other household members  Let healthy household members care for children and pets, if possible. If you have to care for children or pets, wash your hands often and wear a mask.  Sleep in a different bedroom or bed, if possible.  Do not share personal items, such as razors, toothbrushes, deodorant, combs, brushes, towels, and washcloths. Where to find more information Centers for Disease Control and Prevention (CDC)  Information and news updates: www.cdc.gov/coronavirus/2019-ncov World Health Organization (WHO)  Information and news updates: www.who.int/emergencies/diseases/novel-coronavirus-2019  Coronavirus health topic: www.who.int/health-topics/coronavirus  Questions and answers on COVID-19: www.who.int/news-room/q-a-detail/q-a-coronaviruses  Global  tracker: who.sprinklr.com American Academy of Pediatrics (AAP)  Information for families: www.healthychildren.org/English/health-issues/conditions/chest-lungs/Pages/2019-Novel-Coronavirus.aspx The coronavirus situation is changing rapidly. Check your local health authority website or the CDC and WHO websites for updates and news. When should I contact a health care provider?  Contact your health care provider if you have symptoms of an infection, such as fever or cough, and you: ? Have been near anyone who is known to have coronavirus disease. ? Have come into contact with a person who is suspected to have coronavirus disease. ? Have traveled to an area where there is an outbreak of COVID-19. When should I get emergency medical care?  Get help right away by calling your local emergency services (911 in the [place] if you have: ? Trouble breathing. ? Pain or pressure in your chest. ? Confusion. ? Blue-tinged lips and fingernails. ? Difficulty waking from sleep. ? Symptoms that get worse. Let the emergency medical personnel know if you think you have coronavirus disease. Summary  A new respiratory virus is spreading from person to person and causing   COVID-19 (coronavirus disease).  The virus that causes COVID-19 appears to spread easily. It spreads from one person to another through droplets from breathing, speaking, singing, coughing, or sneezing.  Older adults and those with chronic diseases are at higher risk of disease. If you are at higher risk for complications, take extra precautions.  There is currently no vaccine to prevent coronavirus disease. There are no medicines, such as antibiotics or antivirals, to treat the virus.  You can protect yourself and your family by washing your hands often, avoiding touching your face, and covering your coughs and sneezes. This information is not intended to replace advice given to you by your health care provider. Make sure you discuss any  questions you have with your health care provider. Document Revised: 10/16/2019 Document Reviewed: 04/14/2019 Elsevier Patient Education  2020 Elsevier Inc. Sinus Headache  A sinus headache happens when your sinuses get swollen or blocked (clogged). Sinuses are spaces behind the bones of your face and forehead. You may feel pain or pressure in your face, forehead, ears, or upper teeth. Sinus headaches can be mild or very bad. Follow these instructions at home: General instructions  If told: ? Apply a warm, moist washcloth to your face. This can help to lessen pain. ? Use a nasal saline wash. Follow the directions on the bottle or box. Medicines   Take over-the-counter and prescription medicines only as told by your doctor.  If you were prescribed an antibiotic medicine, take it as told by your doctor. Do not stop taking it even if you start to feel better.  Use a nose spray if your nose feels full of mucus (congested). Hydrate and humidify  Drink enough water to keep your pee (urine) pale yellow.  Use a cool mist humidifier to keep the humidity level in your home above 50%.  Breathe in steam for 10-15 minutes, 3-4 times a day or as told by your doctor. You can do this in the bathroom while a hot shower is running.  Try not to spend time in cool or dry air. Contact a doctor if:  You get more than one headache a week.  Light or sound bothers you.  You have a fever.  You feel sick to your stomach (nauseous) or you throw up (vomit).  Your headaches do not get better with treatment. Get help right away if:  You have trouble seeing.  You suddenly have very bad pain in your face or head.  You start to have quick, sudden movements or shaking that you cannot control (seizure).  You are confused.  You have a stiff neck. Summary  A sinus headache happens when your sinuses get swollen or blocked (clogged). Sinuses are spaces behind the bones of your face and forehead.  You  may feel pain or pressure in your face, forehead, ears, or upper teeth.  Take over-the-counter and prescription medicines only as told by your doctor.  If told, apply a warm, moist washcloth to your face. This can help to lessen pain. This information is not intended to replace advice given to you by your health care provider. Make sure you discuss any questions you have with your health care provider. Document Revised: 11/29/2017 Document Reviewed: 09/27/2017 Elsevier Patient Education  2020 Elsevier Inc.  Upper Respiratory Infection, Adult An upper respiratory infection (URI) affects the nose, throat, and upper air passages. URIs are caused by germs (viruses). The most common type of URI is often called "the common cold." Medicines cannot cure URIs,  but you can do things at home to relieve your symptoms. URIs usually get better within 7-10 days. Follow these instructions at home: Activity  Rest as needed.  If you have a fever, stay home from work or school until your fever is gone, or until your doctor says you may return to work or school. ? You should stay home until you cannot spread the infection anymore (you are not contagious). ? Your doctor may have you wear a face mask so you have less risk of spreading the infection. Relieving symptoms  Gargle with a salt-water mixture 3-4 times a day or as needed. To make a salt-water mixture, completely dissolve -1 tsp of salt in 1 cup of warm water.  Use a cool-mist humidifier to add moisture to the air. This can help you breathe more easily. Eating and drinking   Drink enough fluid to keep your pee (urine) pale yellow.  Eat soups and other clear broths. General instructions   Take over-the-counter and prescription medicines only as told by your doctor. These include cold medicines, fever reducers, and cough suppressants.  Do not use any products that contain nicotine or tobacco. These include cigarettes and e-cigarettes. If you  need help quitting, ask your doctor.  Avoid being where people are smoking (avoid secondhand smoke).  Make sure you get regular shots and get the flu shot every year.  Keep all follow-up visits as told by your doctor. This is important. How to avoid spreading infection to others   Wash your hands often with soap and water. If you do not have soap and water, use hand sanitizer.  Avoid touching your mouth, face, eyes, or nose.  Cough or sneeze into a tissue or your sleeve or elbow. Do not cough or sneeze into your hand or into the air. Contact a doctor if:  You are getting worse, not better.  You have any of these: ? A fever. ? Chills. ? Brown or red mucus in your nose. ? Yellow or brown fluid (discharge)coming from your nose. ? Pain in your face, especially when you bend forward. ? Swollen neck glands. ? Pain with swallowing. ? White areas in the back of your throat. Get help right away if:  You have shortness of breath that gets worse.  You have very bad or constant: ? Headache. ? Ear pain. ? Pain in your forehead, behind your eyes, and over your cheekbones (sinus pain). ? Chest pain.  You have long-lasting (chronic) lung disease along with any of these: ? Wheezing. ? Long-lasting cough. ? Coughing up blood. ? A change in your usual mucus.  You have a stiff neck.  You have changes in your: ? Vision. ? Hearing. ? Thinking. ? Mood. Summary  An upper respiratory infection (URI) is caused by a germ called a virus. The most common type of URI is often called "the common cold."  URIs usually get better within 7-10 days.  Take over-the-counter and prescription medicines only as told by your doctor. This information is not intended to replace advice given to you by your health care provider. Make sure you discuss any questions you have with your health care provider. Document Revised: 12/25/2018 Document Reviewed: 08/09/2017 Elsevier Patient Education  2020  [employer].

## 2020-09-22 DIAGNOSIS — Z20822 Contact with and (suspected) exposure to covid-19: Secondary | ICD-10-CM | POA: Diagnosis not present

## 2020-09-29 ENCOUNTER — Encounter: Payer: Self-pay | Admitting: Adult Health

## 2020-10-14 ENCOUNTER — Encounter: Payer: Self-pay | Admitting: Adult Health

## 2020-10-17 ENCOUNTER — Other Ambulatory Visit: Payer: Self-pay | Admitting: Adult Health

## 2020-10-17 DIAGNOSIS — E559 Vitamin D deficiency, unspecified: Secondary | ICD-10-CM

## 2020-10-17 NOTE — Telephone Encounter (Signed)
Requested medication (s) are due for refill today: no  Requested medication (s) are on the active medication list: yes   Last refill: 08/01/2020  Future visit scheduled: no  Notes to clinic:  this refill cannot be delegated    Requested Prescriptions  Pending Prescriptions Disp Refills   Vitamin D, Ergocalciferol, (DRISDOL) 1.25 MG (50000 UNIT) CAPS capsule [Pharmacy Med Name: VITAMIN D2 1.25MG (50,000 UNIT)] 10 capsule 0    Sig: PLEASE SEE ATTACHED FOR DETAILED DIRECTIONS      Endocrinology:  Vitamins - Vitamin D Supplementation Failed - 10/17/2020  1:19 AM      Failed - 50,000 IU strengths are not delegated      Failed - Phosphate in normal range and within 360 days    No results found for: PHOS        Failed - Vitamin D in normal range and within 360 days    Vit D, 25-Hydroxy  Date Value Ref Range Status  07/29/2020 27.7 (L) 30.0 - 100.0 ng/mL Final    Comment:    Vitamin D deficiency has been defined by the Institute of Medicine and an Endocrine Society practice guideline as a level of serum 25-OH vitamin D less than 20 ng/mL (1,2). The Endocrine Society went on to further define vitamin D insufficiency as a level between 21 and 29 ng/mL (2). 1. IOM (Institute of Medicine). 2010. Dietary reference    intakes for calcium and D. [place]: The    [employer]. 2. Holick MF, Binkley Basco, Bischoff-Ferrari HA, et al.    Evaluation, treatment, and prevention of vitamin D    deficiency: an Endocrine Society clinical practice    guideline. JCEM. 2011 Jul; 96(7):1911-30.           Passed - Ca in normal range and within 360 days    Calcium  Date Value Ref Range Status  05/13/2020 9.5 8.7 - 10.2 mg/dL Final   Calcium, Total  Date Value Ref Range Status  06/24/2012 8.5 8.5 - 10.1 mg/dL Final          Passed - Valid encounter within last 12 months    Recent Outpatient Visits           3 weeks ago Acute non-recurrent pansinusitis   [facility] Flinchum, Eula Fried, FNP   2 months ago Depression with anxiety   [facility] Flinchum, Eula Fried, FNP   7 months ago Encounter for surveillance of injectable contraceptive   [facility] Flinchum, Eula Fried, FNP   8 months ago Routine health maintenance   [facility] Flinchum, Eula Fried, [place]

## 2020-10-19 ENCOUNTER — Other Ambulatory Visit: Payer: Self-pay | Admitting: Adult Health

## 2020-10-19 DIAGNOSIS — F418 Other specified anxiety disorders: Secondary | ICD-10-CM

## 2020-10-19 NOTE — Telephone Encounter (Signed)
Requested Prescriptions  Pending Prescriptions Disp Refills  . buPROPion (WELLBUTRIN XL) 300 MG 24 hr tablet [Pharmacy Med Name: BUPROPION HCL XL 300 MG TABLET] 90 tablet 0    Sig: TAKE 1 TABLET BY MOUTH EVERY DAY     Psychiatry: Antidepressants - bupropion Passed - 10/19/2020  1:12 AM      Passed - Completed PHQ-2 or PHQ-9 in the last 360 days.      Passed - Last BP in normal range    BP Readings from Last 1 Encounters:  07/29/20 (!) 129/84         Passed - Valid encounter within last 6 months    Recent Outpatient Visits          4 weeks ago Acute non-recurrent pansinusitis   [facility] Flinchum, Eula Fried, FNP   2 months ago Depression with anxiety   [facility] Flinchum, Eula Fried, FNP   7 months ago Encounter for surveillance of injectable contraceptive   [employer], Eula Fried, FNP   8 months ago Routine health maintenance   [facility] Flinchum, Eula Fried, [place]

## 2020-10-23 ENCOUNTER — Other Ambulatory Visit: Payer: Self-pay | Admitting: Adult Health

## 2020-10-23 DIAGNOSIS — E039 Hypothyroidism, unspecified: Secondary | ICD-10-CM

## 2020-10-23 NOTE — Telephone Encounter (Signed)
Requested medication (s) are due for refill today: yes  Requested medication (s) are on the active medication list: yes  Last refill:  08/01/20  Future visit scheduled: no  Notes to clinic:  needs lab work   Requested Prescriptions  Pending Prescriptions Disp Refills   levothyroxine (SYNTHROID) 100 MCG tablet [Pharmacy Med Name: LEVOTHYROXINE 100 MCG TABLET] 90 tablet 0    Sig: TAKE 1 TABLET BY MOUTH EVERY DAY      Endocrinology:  Hypothyroid Agents Failed - 10/23/2020  9:01 AM      Failed - TSH needs to be rechecked within 3 months after an abnormal result. Refill until TSH is due.      Failed - TSH in normal range and within 360 days    TSH  Date Value Ref Range Status  07/29/2020 13.000 (H) 0.450 - 4.500 uIU/mL Final          Passed - Valid encounter within last 12 months    Recent Outpatient Visits           1 month ago Acute non-recurrent pansinusitis   [facility] Flinchum, Eula Fried, FNP   2 months ago Depression with anxiety   [facility] Flinchum, Eula Fried, FNP   8 months ago Encounter for surveillance of injectable contraceptive   [employer], Eula Fried, FNP   9 months ago Routine health maintenance   [facility] Flinchum, Eula Fried, [place]

## 2020-10-25 ENCOUNTER — Other Ambulatory Visit: Payer: Self-pay | Admitting: Adult Health

## 2020-10-25 DIAGNOSIS — E559 Vitamin D deficiency, unspecified: Secondary | ICD-10-CM

## 2020-10-25 DIAGNOSIS — E039 Hypothyroidism, unspecified: Secondary | ICD-10-CM

## 2020-10-25 NOTE — Progress Notes (Signed)
Orders Placed This Encounter  Procedures  . TSH  . Comprehensive Metabolic Panel (CMET)  . CBC with Differential/Platelet  . VITAMIN D 25 Hydroxy (Vit-D Deficiency, Fractures)   Labs due after 10/30.

## 2020-10-26 ENCOUNTER — Encounter: Payer: Self-pay | Admitting: Adult Health

## 2020-10-27 ENCOUNTER — Other Ambulatory Visit: Payer: Self-pay | Admitting: Adult Health

## 2020-10-27 DIAGNOSIS — F418 Other specified anxiety disorders: Secondary | ICD-10-CM

## 2020-10-27 MED ORDER — BUPROPION HCL ER (XL) 300 MG PO TB24: 300.0000 mg | ORAL_TABLET | Freq: Every day | ORAL | 1 refills | Status: DC

## 2020-10-27 MED ORDER — HYDROXYZINE HCL 50 MG PO TABS: 50.0000 mg | ORAL_TABLET | Freq: Three times a day (TID) | ORAL | 0 refills | Status: DC | PRN

## 2020-10-28 ENCOUNTER — Ambulatory Visit (INDEPENDENT_AMBULATORY_CARE_PROVIDER_SITE_OTHER): Payer: Medicaid Other | Admitting: Adult Health

## 2020-10-28 ENCOUNTER — Other Ambulatory Visit: Payer: Self-pay

## 2020-10-28 DIAGNOSIS — Z3042 Encounter for surveillance of injectable contraceptive: Secondary | ICD-10-CM | POA: Diagnosis not present

## 2020-10-28 MED ORDER — MEDROXYPROGESTERONE ACETATE 150 MG/ML IM SUSP
150.0000 mg | Freq: Once | INTRAMUSCULAR | Status: AC
  Administered 2020-10-28: 150 mg via INTRAMUSCULAR

## 2020-10-28 NOTE — Progress Notes (Signed)
Patient presents today for Depo injection only. Feels well no complaints. Administered medroxyprogesterone 150mg /mL into left ventrogluteal with a 25g 1.5in needle. Patient tolerated well. She is to return on or between Jan 14th-Jan 28th for next injection.

## 2020-12-05 ENCOUNTER — Encounter: Payer: Self-pay | Admitting: Adult Health

## 2020-12-07 DIAGNOSIS — E559 Vitamin D deficiency, unspecified: Secondary | ICD-10-CM | POA: Diagnosis not present

## 2020-12-07 DIAGNOSIS — E039 Hypothyroidism, unspecified: Secondary | ICD-10-CM | POA: Diagnosis not present

## 2020-12-08 ENCOUNTER — Other Ambulatory Visit: Payer: Self-pay | Admitting: Adult Health

## 2020-12-08 DIAGNOSIS — F418 Other specified anxiety disorders: Secondary | ICD-10-CM

## 2020-12-08 DIAGNOSIS — E039 Hypothyroidism, unspecified: Secondary | ICD-10-CM

## 2020-12-08 DIAGNOSIS — E559 Vitamin D deficiency, unspecified: Secondary | ICD-10-CM

## 2020-12-08 LAB — COMPREHENSIVE METABOLIC PANEL
ALT: 20 IU/L (ref 0–32)
AST: 12 IU/L (ref 0–40)
Albumin/Globulin Ratio: 1.7 (ref 1.2–2.2)
Albumin: 4.2 g/dL (ref 3.9–5.0)
Alkaline Phosphatase: 80 IU/L (ref 44–121)
BUN/Creatinine Ratio: 10 (ref 9–23)
BUN: 7 mg/dL (ref 6–20)
Bilirubin Total: <0.2 mg/dL (ref 0.0–1.2)
CO2: 20 mmol/L (ref 20–29)
Calcium: 9.1 mg/dL (ref 8.7–10.2)
Chloride: 107 mmol/L — ABNORMAL HIGH (ref 96–106)
Creatinine, Ser: 0.67 mg/dL (ref 0.57–1.00)
GFR calc Af Amer: 136 mL/min/{1.73_m2} (ref >59)
GFR calc non Af Amer: 118 mL/min/{1.73_m2} (ref >59)
Globulin, Total: 2.5 g/dL (ref 1.5–4.5)
Glucose: 97 mg/dL (ref 65–99)
Potassium: 4.7 mmol/L (ref 3.5–5.2)
Sodium: 140 mmol/L (ref 134–144)
Total Protein: 6.7 g/dL (ref 6.0–8.5)

## 2020-12-08 LAB — CBC WITH DIFFERENTIAL/PLATELET
Basophils Absolute: 0 10*3/uL (ref 0.0–0.2)
Basos: 0 %
EOS (ABSOLUTE): 0.1 10*3/uL (ref 0.0–0.4)
Eos: 3 %
Hematocrit: 37.8 % (ref 34.0–46.6)
Hemoglobin: 12.5 g/dL (ref 11.1–15.9)
Immature Grans (Abs): 0 10*3/uL (ref 0.0–0.1)
Immature Granulocytes: 1 %
Lymphocytes Absolute: 2.2 10*3/uL (ref 0.7–3.1)
Lymphs: 45 %
MCH: 28 pg (ref 26.6–33.0)
MCHC: 33.1 g/dL (ref 31.5–35.7)
MCV: 85 fL (ref 79–97)
Monocytes Absolute: 0.3 10*3/uL (ref 0.1–0.9)
Monocytes: 6 %
Neutrophils Absolute: 2.2 10*3/uL (ref 1.4–7.0)
Neutrophils: 45 %
Platelets: 288 10*3/uL (ref 150–450)
RBC: 4.46 x10E6/uL (ref 3.77–5.28)
RDW: 12.2 % (ref 11.7–15.4)
WBC: 5 10*3/uL (ref 3.4–10.8)

## 2020-12-08 LAB — TSH: TSH: 3.53 u[IU]/mL (ref 0.450–4.500)

## 2020-12-08 LAB — VITAMIN D 25 HYDROXY (VIT D DEFICIENCY, FRACTURES): Vit D, 25-Hydroxy: 29.9 ng/mL — ABNORMAL LOW (ref 30.0–100.0)

## 2020-12-08 MED ORDER — VITAMIN D (ERGOCALCIFEROL) 1.25 MG (50000 UNIT) PO CAPS: 50000.0000 [IU] | ORAL_CAPSULE | ORAL | 0 refills | Status: DC

## 2020-12-08 MED ORDER — HYDROXYZINE HCL 10 MG PO TABS: 10.0000 mg | ORAL_TABLET | Freq: Three times a day (TID) | ORAL | 0 refills | Status: DC | PRN

## 2020-12-08 MED ORDER — LEVOTHYROXINE SODIUM 88 MCG PO TABS
88.0000 ug | ORAL_TABLET | Freq: Every day | ORAL | 1 refills | Status: DC
Start: 2020-12-08 — End: 2021-06-26

## 2020-12-08 NOTE — Progress Notes (Signed)
Meds ordered this encounter  Medications  . Vitamin D, Ergocalciferol, (DRISDOL) 1.25 MG (50000 UNIT) CAPS capsule    Sig: Take 1 capsule (50,000 Units total) by mouth every 7 (seven) days. For total of 12 weeks. Take ONE tablet ONCE weekly by mouth. Recheck lab 1- 2 weeks after complete.    Dispense:  12 capsule    Refill:  0  . levothyroxine (SYNTHROID) 88 MCG tablet    Sig: Take 1 tablet (88 mcg total) by mouth daily.    Dispense:  90 tablet    Refill:  1  . hydrOXYzine (ATARAX/VISTARIL) 10 MG tablet    Sig: Take 1 tablet (10 mg total) by mouth 3 (three) times daily as needed. May take with her 50 mg tablet if needed for total of 60 mg TID PRN.    Dispense:  180 tablet    Refill:  0

## 2020-12-08 NOTE — Progress Notes (Signed)
CBC and CMP within normal range. Vitamin D is still low, lets repeat prescription Vitamin D, TSH is within normal limits, however that was a big change for your body decreasing TSH from 13 to 3.5, I will decrease your synthroid dose back down to 88 mcg once per day discontinue the synthroid dose - take this medicine alone and not within 2 hours of other medications and vitamins. Recheck TSH and Vitamin D level in 2 months with office follow up.   To help with anxiety while your body adjusts I have sent in an additional 10 mg of the Hydroxyzine, you can take with your 50 mg tablet up to three times daily as needed for anxiety for a total of 60 mg. Let me know if this is not helping.    Meds ordered this encounter Medications Vitamin D, Ergocalciferol, (DRISDOL) 1.25 MG (50000 UNIT) CAPS capsule Sig: Take 1 capsule (50,000 Units total) by mouth every 7 (seven) days. For total of 12 weeks. Take ONE tablet ONCE weekly by mouth. Recheck lab 1- 2 weeks after complete.   Dispense:  12 capsule   Refill:  0   levothyroxine (SYNTHROID) 88 MCG tablet   Sig: Take 1 tablet (88 mcg total) by mouth daily.   Dispense:  90 tablet   Refill:  1   hydrOXYzine (ATARAX/VISTARIL) 10 MG tablet Sig: Take 1 tablet (10 mg total) by mouth 3 (three) times daily as needed. May take with her 50 mg tablet if needed for total of 60 mg TID PRN.   Dispense:  180 tablet   Refill:  0

## 2021-01-05 ENCOUNTER — Encounter: Payer: Self-pay | Admitting: Adult Health

## 2021-01-06 ENCOUNTER — Other Ambulatory Visit: Payer: Self-pay | Admitting: Adult Health

## 2021-01-06 MED ORDER — PREDNISONE 10 MG (21) PO TBPK
ORAL_TABLET | ORAL | 0 refills | Status: DC
Start: 2021-01-06 — End: 2021-05-11

## 2021-01-06 MED ORDER — FLUCONAZOLE 150 MG PO TABS: 150.0000 mg | ORAL_TABLET | ORAL | 0 refills | Status: DC

## 2021-01-06 MED ORDER — BENZONATATE 200 MG PO CAPS: 200.0000 mg | ORAL_CAPSULE | Freq: Two times a day (BID) | ORAL | 0 refills | Status: DC | PRN

## 2021-01-06 MED ORDER — AMOXICILLIN-POT CLAVULANATE 875-125 MG PO TABS: 1.0000 | ORAL_TABLET | Freq: Two times a day (BID) | ORAL | 0 refills | Status: DC

## 2021-01-10 ENCOUNTER — Encounter: Payer: Self-pay | Admitting: Adult Health

## 2021-01-15 ENCOUNTER — Encounter: Payer: Self-pay | Admitting: Adult Health

## 2021-01-20 ENCOUNTER — Ambulatory Visit: Payer: Self-pay | Admitting: Adult Health

## 2021-01-27 ENCOUNTER — Ambulatory Visit (INDEPENDENT_AMBULATORY_CARE_PROVIDER_SITE_OTHER): Payer: Medicaid Other | Admitting: Adult Health

## 2021-01-27 ENCOUNTER — Other Ambulatory Visit: Payer: Self-pay

## 2021-01-27 DIAGNOSIS — Z3042 Encounter for surveillance of injectable contraceptive: Secondary | ICD-10-CM

## 2021-01-27 MED ORDER — MEDROXYPROGESTERONE ACETATE 150 MG/ML IM SUSP
150.0000 mg | Freq: Once | INTRAMUSCULAR | Status: AC
  Administered 2021-01-27: 150 mg via INTRAMUSCULAR

## 2021-01-27 NOTE — Progress Notes (Signed)
°  °  Nurse visit only. No provider contact.

## 2021-01-29 NOTE — Patient Instructions (Signed)
Contraceptive Injection A contraceptive injection is a shot that prevents pregnancy. It is also called a birth control shot. The shot contains the hormone progestin, which prevents pregnancy by:  Stopping the ovaries from releasing eggs.  Thickening cervical mucus to prevent sperm from entering the cervix.  Thinning the lining of the uterus to prevent a fertilized egg from attaching to the uterus. Contraceptive injections are given under the skin (subcutaneous) or into a muscle (intramuscular). For these shots to work, you must get one of them every 3 months (12-13 weeks) from a health care provider. Tell a health care provider about:  Any allergies you have.  All medicines you are taking, including vitamins, herbs, eye drops, creams, and over-the-counter medicines.  Any blood disorders you have.  Any medical conditions you have.  Whether you are pregnant or may be pregnant. What are the risks? Generally, this is a safe procedure. However, problems may occur, including:  Mood changes or depression.  Loss of bone density (osteoporosis) after long-term use.  Blood clots. This is rare.  Higher risk of an egg being fertilized outside your uterus (ectopic pregnancy).This is rare. What happens before the procedure?  Your health care provider may do a routine physical exam.  You may have a test to make sure you are not pregnant. What happens during the procedure?  The area where the shot will be given will be cleaned and sanitized with alcohol.  A needle will be inserted into a muscle in your upper arm or buttock, or into the skin of your thigh or abdomen. The needle will be attached to a syringe with the medicine inside of it.  The medicine will be pushed through the syringe and injected into your body.  A small bandage (dressing) may be placed over the injection site.   What can I expect after the procedure?  After the procedure, it is common to have: ? Soreness around the  injection site for a couple of days. ? Irregular menstrual bleeding. ? Weight gain. ? Breast tenderness. ? Headaches. ? Discomfort in your abdomen.  Ask your health care provider whether you need to use an added method of birth control (backup contraception), such as a condom, sponge, or spermicide. ? If the first shot is given 1-7 days after the start of your last menstrual period, you will not need backup contraception. ? If the first shot is given at any other time during your menstrual cycle, you should avoid having sex. If you do have sex, you will need to use backup contraception for 7 days after you receive the shot. Follow these instructions at home: General instructions  Take over-the-counter and prescription medicines only as told by your health care provider.  Do not rub or massage the injection site.  Track your menstrual periods so you will know if they become irregular.  Always use a condom to protect against sexually transmitted infections (STIs).  Make sure you schedule an appointment in time for your next shot and mark it on your calendar. You must get an injection every 3 months (12-13 weeks) to prevent pregnancy. Lifestyle  Do not use any products that contain nicotine or tobacco. These products include cigarettes, chewing tobacco, and vaping devices, such as e-cigarettes. If you need help quitting, ask your health care provider.  Eat foods that are high in calcium and vitamin D, such as milk, cheese, and salmon. Doing this may help with any loss in bone density caused by the contraceptive injection. Ask your  health care provider for dietary recommendations. Contact a health care provider if you:  Have nausea or vomiting.  Have abnormal vaginal discharge or bleeding.  Miss a menstrual period or think you might be pregnant.  Experience mood changes or depression.  Feel dizzy or light-headed.  Have leg pain. Get help right away if you:  Have chest pain or  cough up blood.  Have shortness of breath.  Have a severe headache that does not go away.  Have numbness in any part of your body.  Have slurred speech or vision problems.  Have vaginal bleeding that is abnormally heavy or does not stop, or you have severe pain in your abdomen.  Have depression that does not get better with treatment. If you ever feel like you may hurt yourself or others, or have thoughts about taking your own life, get help right away. Go to your nearest emergency department or:  Call your local emergency services (911 in the [place]  Call a suicide crisis helpline, such as the National Suicide Prevention Lifeline at 1-800-273-8255. This is open 24 hours a day in the [place]  Text the Crisis Text Line at 741741 (in the [place] Summary  A contraceptive injection is a shot that prevents pregnancy. It is also called the birth control shot.  The shot is given under the skin (subcutaneous) or into a muscle (intramuscular).  After this procedure, it is common to have soreness around the injection site for a couple of days.  To prevent pregnancy, the shot must be given by a health care provider every 3 months (12-13 weeks).  After you have the shot, ask your health care provider whether you need to use an added method of birth control (backup contraception), such as a condom, sponge, or spermicide. This information is not intended to replace advice given to you by your health care provider. Make sure you discuss any questions you have with your health care provider. Document Revised: 06/27/2020 Document Reviewed: 06/27/2020 [employer] Patient Education  2021 [employer]  

## 2021-04-14 ENCOUNTER — Other Ambulatory Visit: Payer: Self-pay

## 2021-04-14 ENCOUNTER — Ambulatory Visit (INDEPENDENT_AMBULATORY_CARE_PROVIDER_SITE_OTHER): Payer: Medicaid Other | Admitting: Adult Health

## 2021-04-14 VITALS — BP 136/70 | HR 88 | Temp 98.0°F | Resp 18

## 2021-04-14 DIAGNOSIS — R350 Frequency of micturition: Secondary | ICD-10-CM

## 2021-04-14 DIAGNOSIS — N939 Abnormal uterine and vaginal bleeding, unspecified: Secondary | ICD-10-CM

## 2021-04-14 LAB — POCT URINALYSIS DIPSTICK
Bilirubin, UA: NEGATIVE
Glucose, UA: NEGATIVE
Leukocytes, UA: NEGATIVE
Nitrite, UA: NEGATIVE
Protein, UA: NEGATIVE
Spec Grav, UA: <=1.005 — AB (ref 1.010–1.025)
Urobilinogen, UA: 0.2 E.U./dL
pH, UA: 6.5 (ref 5.0–8.0)

## 2021-04-14 LAB — POCT URINE PREGNANCY: Preg Test, Ur: NEGATIVE

## 2021-04-14 NOTE — Patient Instructions (Addendum)
Would recommend starting Wellbutrin back at half dose for 1 week and may increase to full tablet after that if needed.  Monitor bleeding, use back up birth control.    Labs today.    Orders Placed This Encounter  Procedures  . TSH  . CBC with Differential/Platelet  . Comprehensive Metabolic Panel (CMET)  . B-HCG Quant  . HgB A1c  . POCT urinalysis dipstick  . POCT urine pregnancy    Hypothyroidism  Hypothyroidism is when the thyroid gland does not make enough of certain hormones (it is underactive). The thyroid gland is a small gland located in the lower front part of the neck, just in front of the windpipe (trachea). This gland makes hormones that help control how the body uses food for energy (metabolism) as well as how the heart and brain function. These hormones also play a role in keeping your bones strong. When the thyroid is underactive, it produces too little of the hormones thyroxine (T4) and triiodothyronine (T3). What are the causes? This condition may be caused by:  Hashimoto's disease. This is a disease in which the body's disease-fighting system (immune system) attacks the thyroid gland. This is the most common cause.  Viral infections.  Pregnancy.  Certain medicines.  Birth defects.  Past radiation treatments to the head or neck for cancer.  Past treatment with radioactive iodine.  Past exposure to radiation in the environment.  Past surgical removal of part or all of the thyroid.  Problems with a gland in the center of the brain (pituitary gland).  Lack of enough iodine in the diet. What increases the risk? You are more likely to develop this condition if:  You are female.  You have a family history of thyroid conditions.  You use a medicine called lithium.  You take medicines that affect the immune system (immunosuppressants). What are the signs or symptoms? Symptoms of this condition include:  Feeling as though you have no energy  (lethargy).  Not being able to tolerate cold.  Weight gain that is not explained by a change in diet or exercise habits.  Lack of appetite.  Dry skin.  Coarse hair.  Menstrual irregularity.  Slowing of thought processes.  Constipation.  Sadness or depression. How is this diagnosed? This condition may be diagnosed based on:  Your symptoms, your medical history, and a physical exam.  Blood tests. You may also have imaging tests, such as an ultrasound or MRI. How is this treated? This condition is treated with medicine that replaces the thyroid hormones that your body does not make. After you begin treatment, it may take several weeks for symptoms to go away. Follow these instructions at home:  Take over-the-counter and prescription medicines only as told by your health care provider.  If you start taking any new medicines, tell your health care provider.  Keep all follow-up visits as told by your health care provider. This is important. ? As your condition improves, your dosage of thyroid hormone medicine may change. ? You will need to have blood tests regularly so that your health care provider can monitor your condition. Contact a health care provider if:  Your symptoms do not get better with treatment.  You are taking thyroid hormone replacement medicine and you: ? Sweat a lot. ? Have tremors. ? Feel anxious. ? Lose weight rapidly. ? Cannot tolerate heat. ? Have emotional swings. ? Have diarrhea. ? Feel weak. Get help right away if you have:  Chest pain.  An irregular  heartbeat.  A rapid heartbeat.  Difficulty breathing. Summary  Hypothyroidism is when the thyroid gland does not make enough of certain hormones (it is underactive).  When the thyroid is underactive, it produces too little of the hormones thyroxine (T4) and triiodothyronine (T3).  The most common cause is Hashimoto's disease, a disease in which the body's disease-fighting system (immune  system) attacks the thyroid gland. The condition can also be caused by viral infections, medicine, pregnancy, or past radiation treatment to the head or neck.  Symptoms may include weight gain, dry skin, constipation, feeling as though you do not have energy, and not being able to tolerate cold.  This condition is treated with medicine to replace the thyroid hormones that your body does not make. This information is not intended to replace advice given to you by your health care provider. Make sure you discuss any questions you have with your health care provider. Document Revised: 09/16/2020 Document Reviewed: 09/01/2020 Elsevier Patient Education  2021 Elsevier Inc. [email or web address]  Generalized Anxiety Disorder, Adult Generalized anxiety disorder (GAD) is a mental health condition. Unlike normal worries, anxiety related to GAD is not triggered by a specific event. These worries do not fade or get better with time. GAD interferes with relationships, work, and school. GAD symptoms can vary from mild to severe. People with severe GAD can have intense waves of anxiety with physical symptoms that are similar to panic attacks. What are the causes? The exact cause of GAD is not known, but the following are believed to have an impact:  Differences in natural brain chemicals.  Genes passed down from parents to children.  Differences in the way threats are perceived.  Development during childhood.  Personality. What increases the risk? The following factors may make you more likely to develop this condition:  Being female.  Having a family history of anxiety disorders.  Being very shy.  Experiencing very stressful life events, such as the death of a loved one.  Having a very stressful family environment. What are the signs or symptoms? People with GAD often worry excessively about many things in their lives, such as their health and family. Symptoms may also include:  Mental and  emotional symptoms: ? Worrying excessively about natural disasters. ? Fear of being late. ? Difficulty concentrating. ? Fears that others are judging your performance.  Physical symptoms: ? Fatigue. ? Headaches, muscle tension, muscle twitches, trembling, or feeling shaky. ? Feeling like your heart is pounding or beating very fast. ? Feeling out of breath or like you cannot take a deep breath. ? Having trouble falling asleep or staying asleep, or experiencing restlessness. ? Sweating. ? Nausea, diarrhea, or irritable bowel syndrome (IBS).  Behavioral symptoms: ? Experiencing erratic moods or irritability. ? Avoidance of new situations. ? Avoidance of people. ? Extreme difficulty making decisions. How is this diagnosed? This condition is diagnosed based on your symptoms and medical history. You will also have a physical exam. Your health care provider may perform tests to rule out other possible causes of your symptoms. To be diagnosed with GAD, a person must have anxiety that:  Is out of his or her control.  Affects several different aspects of his or her life, such as work and relationships.  Causes distress that makes him or her unable to take part in normal activities.  Includes at least three symptoms of GAD, such as restlessness, fatigue, trouble concentrating, irritability, muscle tension, or sleep problems. Before your health care provider can confirm a diagnosis  of GAD, these symptoms must be present more days than they are not, and they must last for 6 months or longer. How is this treated? This condition may be treated with:  Medicine. Antidepressant medicine is usually prescribed for long-term daily control. Anti-anxiety medicines may be added in severe cases, especially when panic attacks occur.  Talk therapy (psychotherapy). Certain types of talk therapy can be helpful in treating GAD by providing support, education, and guidance. Options include: ? Cognitive  behavioral therapy (CBT). People learn coping skills and self-calming techniques to ease their physical symptoms. They learn to identify unrealistic thoughts and behaviors and to replace them with more appropriate thoughts and behaviors. ? Acceptance and commitment therapy (ACT). This treatment teaches people how to be mindful as a way to cope with unwanted thoughts and feelings. ? Biofeedback. This process trains you to manage your body's response (physiological response) through breathing techniques and relaxation methods. You will work with a therapist while machines are used to monitor your physical symptoms.  Stress management techniques. These include yoga, meditation, and exercise. A mental health specialist can help determine which treatment is best for you. Some people see improvement with one type of therapy. However, other people require a combination of therapies.   Follow these instructions at home: Lifestyle  Maintain a consistent routine and schedule.  Anticipate stressful situations. Create a plan, and allow extra time to work with your plan.  Practice stress management or self-calming techniques that you have learned from your therapist or your health care provider. General instructions  Take over-the-counter and prescription medicines only as told by your health care provider.  Understand that you are likely to have setbacks. Accept this and be kind to yourself as you persist to take better care of yourself.  Recognize and accept your accomplishments, even if you judge them as small.  Keep all follow-up visits as told by your health care provider. This is important. Contact a health care provider if:  Your symptoms do not get better.  Your symptoms get worse.  You have signs of depression, such as: ? A persistently sad or irritable mood. ? Loss of enjoyment in activities that used to bring you joy. ? Change in weight or eating. ? Changes in sleeping  habits. ? Avoiding friends or family members. ? Loss of energy for normal tasks. ? Feelings of guilt or worthlessness. Get help right away if:  You have serious thoughts about hurting yourself or others. If you ever feel like you may hurt yourself or others, or have thoughts about taking your own life, get help right away. Go to your nearest emergency department or:  Call your local emergency services (911 in the [place]  Call a suicide crisis helpline, such as the National Suicide Prevention Lifeline at (317)666-0436. This is open 24 hours a day in the [place]  Text the Crisis Text Line at 308-586-7498 (in the [place] Summary  Generalized anxiety disorder (GAD) is a mental health condition that involves worry that is not triggered by a specific event.  People with GAD often worry excessively about many things in their lives, such as their health and family.  GAD may cause symptoms such as restlessness, trouble concentrating, sleep problems, frequent sweating, nausea, diarrhea, headaches, and trembling or muscle twitching.  A mental health specialist can help determine which treatment is best for you. Some people see improvement with one type of therapy. However, other people require a combination of therapies. This information is not intended to replace  advice given to you by your health care provider. Make sure you discuss any questions you have with your health care provider. Document Revised: 10/07/2019 Document Reviewed: 10/07/2019 Elsevier Patient Education  2021 Elsevier Inc. Bupropion tablets (Depression/Mood Disorders) What is this medicine? BUPROPION (byoo PROE pee on) is used to treat depression. This medicine may be used for other purposes; ask your health care provider or pharmacist if you have questions. COMMON BRAND NAME(S): Wellbutrin What should I tell my health care provider before I take this medicine? They need to know if you have any of these conditions:  an eating  disorder, such as anorexia or bulimia  bipolar disorder or psychosis  diabetes or high blood sugar, treated with medication  glaucoma  heart disease, previous heart attack, or irregular heart beat  head injury or brain tumor  high blood pressure  kidney or liver disease  seizures  suicidal thoughts or a previous suicide attempt  Tourette's syndrome  weight loss  an unusual or allergic reaction to bupropion, other medicines, foods, dyes, or preservatives  breast-feeding  pregnant or trying to become pregnant How should I use this medicine? Take this medicine by mouth with a glass of water. Follow the directions on the prescription label. You can take it with or without food. If it upsets your stomach, take it with food. Take your medicine at regular intervals. Do not take your medicine more often than directed. Do not stop taking this medicine suddenly except upon the advice of your doctor. Stopping this medicine too quickly may cause serious side effects or your condition may worsen. A special MedGuide will be given to you by the pharmacist with each prescription and refill. Be sure to read this information carefully each time. Talk to your pediatrician regarding the use of this medicine in children. Special care may be needed. Overdosage: If you think you have taken too much of this medicine contact a poison control center or emergency room at once. NOTE: This medicine is only for you. Do not share this medicine with others. What if I miss a dose? If you miss a dose, take it as soon as you can. If it is less than four hours to your next dose, take only that dose and skip the missed dose. Do not take double or extra doses. What may interact with this medicine? Do not take this medicine with any of the following medications:  linezolid  MAOIs like Azilect, Carbex, Eldepryl, Marplan, Nardil, and Parnate  methylene blue (injected into a vein)  other medicines that contain  bupropion like Zyban This medicine may also interact with the following medications:  alcohol  certain medicines for anxiety or sleep  certain medicines for blood pressure like metoprolol, propranolol  certain medicines for depression or psychotic disturbances  certain medicines for HIV or AIDS like efavirenz, lopinavir, nelfinavir, ritonavir  certain medicines for irregular heart beat like propafenone, flecainide  certain medicines for Parkinson's disease like amantadine, levodopa  certain medicines for seizures like carbamazepine, phenytoin, phenobarbital  cimetidine  clopidogrel  cyclophosphamide  digoxin  furazolidone  isoniazid  nicotine  orphenadrine  procarbazine  steroid medicines like prednisone or cortisone  stimulant medicines for attention disorders, weight loss, or to stay awake  tamoxifen  theophylline  thiotepa  ticlopidine  tramadol  warfarin This list may not describe all possible interactions. Give your health care provider a list of all the medicines, herbs, non-prescription drugs, or dietary supplements you use. Also tell them if you smoke, drink  alcohol, or use illegal drugs. Some items may interact with your medicine. What should I watch for while using this medicine? Tell your doctor if your symptoms do not get better or if they get worse. Visit your doctor or healthcare provider for regular checks on your progress. Because it may take several weeks to see the full effects of this medicine, it is important to continue your treatment as prescribed by your doctor. This medicine may cause serious skin reactions. They can happen weeks to months after starting the medicine. Contact your healthcare provider right away if you notice fevers or flu-like symptoms with a rash. The rash may be red or purple and then turn into blisters or peeling of the skin. Or, you might notice a red rash with swelling of the face, lips or lymph nodes in your neck or  under your arms. Patients and their families should watch out for new or worsening thoughts of suicide or depression. Also watch out for sudden changes in feelings such as feeling anxious, agitated, panicky, irritable, hostile, aggressive, impulsive, severely restless, overly excited and hyperactive, or not being able to sleep. If this happens, especially at the beginning of treatment or after a change in dose, call your healthcare provider. Avoid alcoholic drinks while taking this medicine. Drinking excessive alcoholic beverages, using sleeping or anxiety medicines, or quickly stopping the use of these agents while taking this medicine may increase your risk for a seizure. Do not drive or use heavy machinery until you know how this medicine affects you. This medicine can impair your ability to perform these tasks. Do not take this medicine close to bedtime. It may prevent you from sleeping. Your mouth may get dry. Chewing sugarless gum or sucking hard candy, and drinking plenty of water may help. Contact your doctor if the problem does not go away or is severe. What side effects may I notice from receiving this medicine? Side effects that you should report to your doctor or health care professional as soon as possible:  allergic reactions like skin rash, itching or hives, swelling of the face, lips, or tongue  breathing problems  changes in vision  confusion  elevated mood, decreased need for sleep, racing thoughts, impulsive behavior  fast or irregular heartbeat  hallucinations, loss of contact with reality  increased blood pressure  rash, fever, and swollen lymph nodes  redness, blistering, peeling, or loosening of the skin, including inside the mouth  seizures  suicidal thoughts or other mood changes  unusually weak or tired  vomiting Side effects that usually do not require medical attention (report to your doctor or health care professional if they continue or are  bothersome):  constipation  headache  loss of appetite  nausea  tremors  weight loss This list may not describe all possible side effects. Call your doctor for medical advice about side effects. You may report side effects to FDA at 1-800-FDA-1088. Where should I keep my medicine? Keep out of the reach of children. Store at room temperature between 20 and 25 degrees C (68 and 77 degrees F), away from direct sunlight and moisture. Keep tightly closed. Throw away any unused medicine after the expiration date. NOTE: This sheet is a summary. It may not cover all possible information. If you have questions about this medicine, talk to your doctor, pharmacist, or health care provider.  2021 Elsevier/Gold Standard (2019-03-12 14:02:47) Urinary Frequency, Adult Urinary frequency means urinating more often than usual. You may urinate every 1-2 hours even though  you drink a normal amount of fluid and do not have a bladder infection or condition. Although you urinate more often than normal, the total amount of urine produced in a day is normal. With urinary frequency, you may have an urgent need to urinate often. The stress and anxiety of needing to find a bathroom quickly can make this urge worse. This condition may go away on its own or you may need treatment at home. Home treatment may include bladder training, exercises, taking medicines, or making changes to your diet. Follow these instructions at home: Bladder health  Keep a bladder diary if told by your health care provider. Keep track of: ? What you eat and drink. ? How often you urinate. ? How much you urinate.  Follow a bladder training program if told by your health care provider. This may include: ? Learning to delay going to the bathroom. ? Double urinating (voiding). This helps if you are not completely emptying your bladder. ? Scheduled voiding.  Do Kegel exercises as told by your health care provider. Kegel exercises  strengthen the muscles that help control urination, which may help the condition.   Eating and drinking  If told by your health care provider, make diet changes, such as: ? Avoiding caffeine. ? Drinking fewer fluids, especially alcohol. ? Not drinking in the evening. ? Avoiding foods or drinks that may irritate the bladder. These include coffee, tea, soda, artificial sweeteners, citrus, tomato-based foods, and chocolate. ? Eating foods that help prevent or ease constipation. Constipation can make this condition worse. Your health care provider may recommend that you:  Drink enough fluid to keep your urine pale yellow.  Take over-the-counter or prescription medicines.  Eat foods that are high in fiber, such as beans, whole grains, and fresh fruits and vegetables.  Limit foods that are high in fat and processed sugars, such as fried or sweet foods. General instructions  Take over-the-counter and prescription medicines only as told by your health care provider.  Keep all follow-up visits as told by your health care provider. This is important. Contact a health care provider if:  You start urinating more often.  You feel pain or irritation when you urinate.  You notice blood in your urine.  Your urine looks cloudy.  You develop a fever.  You begin vomiting. Get help right away if:  You are unable to urinate. Summary  Urinary frequency means urinating more often than usual. With urinary frequency, you may urinate every 1-2 hours even though you drink a normal amount of fluid and do not have a bladder infection or other bladder condition.  Your health care provider may recommend that you keep a bladder diary, follow a bladder training program, or make dietary changes.  If told by your health care provider, do Kegel exercises to strengthen the muscles that help control urination.  Take over-the-counter and prescription medicines only as told by your health care  provider.  Contact a health care provider if your symptoms do not improve or get worse. This information is not intended to replace advice given to you by your health care provider. Make sure you discuss any questions you have with your health care provider. Document Revised: 06/26/2018 Document Reviewed: 06/26/2018 Elsevier Patient Education  2021 [employer].

## 2021-04-14 NOTE — Progress Notes (Signed)
Established patient visit   Patient: Carolyn Bennett   DOB: 11-Apr-1991   30 y.o. Female  MRN: [id number] Visit Date: 04/14/2021  Today's healthcare provider: Marcille Buffy, FNP   Chief Complaint  Patient presents with  . Vaginal Bleeding   Subjective    Vaginal Bleeding The patient's primary symptoms include vaginal bleeding. This is a new problem. The current episode started in the past 7 days. The problem has been gradually improving. The pain is mild. The problem affects both sides. She is not pregnant. Associated symptoms include abdominal pain (cramping. ), headaches and urgency. Pertinent negatives include no anorexia, back pain, chills, constipation, diarrhea, discolored urine, dysuria, fever, flank pain, frequency, hematuria, joint pain, joint swelling, nausea, painful intercourse, rash, sore throat or vomiting. The vaginal discharge was normal. The vaginal bleeding is typical of menses. She has been passing clots. She has not been passing tissue. Nothing aggravates the symptoms. She has tried nothing for the symptoms. She is sexually active. No, her partner does not have an STD. She uses progestin injections for contraception. Her menstrual history has been irregular.    Denies any STD testing.   Bleeding started about two weeks ago.  Has been on Depo since 2018 and no history of bleeding.   Anxiety increasing she has stopped Wellbutrin and since her birthday. She felt better when she was on it.    Status: Edited Result - FINAL   Visible to patient: Yes (seen)   Next appt: None   Dx: Screening for cervical cancer   1 Result Note   1 Patient Communication   1 HM Topic  Component 1 yr ago  High risk HPV Negative VC   Neisseria Gonorrhea Negative VC   Chlamydia Negative VC   Adequacy Satisfactory for evaluation; transformation zone component PRESENT. VC   Diagnosis - Negative for intraepithelial lesion or malignancy (NILM) VC   Comment Normal  Reference Ranger Chlamydia - Negative VC   Comment Normal Reference Range Neisseria Gonorrhea - Negative VC   Comment Normal Reference Range HPV - Negative VC   Resulting Agency [facility]         Specimen Collected: 02/26/20 09:25 Last Resulted: 03/08/20 17:31            Medications: Outpatient Medications Prior to Visit  Medication Sig  . albuterol (VENTOLIN HFA) 108 (90 Base) MCG/ACT inhaler Inhale 1-2 puffs into the lungs every 6 (six) hours as needed for wheezing or shortness of breath.  Marland Kitchen amoxicillin-clavulanate (AUGMENTIN) 875-125 MG tablet Take 1 tablet by mouth 2 (two) times daily.  . benzonatate (TESSALON) 200 MG capsule Take 1 capsule (200 mg total) by mouth 2 (two) times daily as needed for cough.  Marland Kitchen buPROPion (WELLBUTRIN XL) 300 MG 24 hr tablet Take 1 tablet (300 mg total) by mouth daily.  . cyclobenzaprine (FLEXERIL) 10 MG tablet Take 1 tablet (10 mg total) by mouth 3 (three) times daily as needed for muscle spasms (follow up advised if not improving at anytime.).  Marland Kitchen fluconazole (DIFLUCAN) 150 MG tablet Take 1 tablet (150 mg total) by mouth as directed. Take one tablet by mouth on day 1. May repeat dose of one tablet by mouth on day four.  . fluticasone (FLONASE) 50 MCG/ACT nasal spray Place 2 sprays into both nostrils daily.  Marland Kitchen guaiFENesin (MUCINEX) 600 MG 12 hr tablet Take 2 tablets (1,200 mg total) by mouth 2 (two) times daily.  . hydrOXYzine (ATARAX/VISTARIL) 10 MG tablet  Take 1 tablet (10 mg total) by mouth 3 (three) times daily as needed. May take with her 50 mg tablet if needed for total of 60 mg TID PRN.  . hydrOXYzine (ATARAX/VISTARIL) 50 MG tablet Take 1 tablet (50 mg total) by mouth 3 (three) times daily as needed (may cause drowsiness).  Marland Kitchen levothyroxine (SYNTHROID) 88 MCG tablet Take 1 tablet (88 mcg total) by mouth daily.  . medroxyPROGESTERone (DEPO-PROVERA) 150 MG/ML injection Inject 1 mL (150 mg total) into the muscle every 3 (three) months.  . predniSONE  (STERAPRED UNI-PAK 21 TAB) 10 MG (21) TBPK tablet PO: Take 6 tablets on day 1:Take 5 tablets day 2:Take 4 tablets day 3: Take 3 tablets day 4:Take 2 tablets day five: 5 Take 1 tablet day 6  . Vitamin D, Ergocalciferol, (DRISDOL) 1.25 MG (50000 UNIT) CAPS capsule Take 1 capsule (50,000 Units total) by mouth every 7 (seven) days. For total of 12 weeks. Take ONE tablet ONCE weekly by mouth. Recheck lab 1- 2 weeks after complete.   No facility-administered medications prior to visit.    Review of Systems  Constitutional: Negative for chills and fever.  HENT: Negative for sore throat.   Gastrointestinal: Positive for abdominal pain (cramping. ). Negative for anorexia, constipation, diarrhea, nausea and vomiting.  Genitourinary: Positive for urgency and vaginal bleeding. Negative for dysuria, flank pain, frequency and hematuria.  Musculoskeletal: Negative for back pain and joint pain.  Skin: Negative for rash.  Neurological: Positive for headaches.  cramping.      Objective    BP 136/70   Pulse 88   Temp 98 F (36.7 C)   Resp 18   SpO2 100%      Results for orders placed or performed in visit on 04/14/21  TSH  Result Value Ref Range   TSH 3.830 0.450 - 4.500 uIU/mL  CBC with Differential/Platelet  Result Value Ref Range   WBC 6.6 3.4 - 10.8 x10E3/uL   RBC 4.72 3.77 - 5.28 x10E6/uL   Hemoglobin 13.2 11.1 - 15.9 g/dL   Hematocrit 39.5 34.0 - 46.6 %   MCV 84 79 - 97 fL   MCH 28.0 26.6 - 33.0 pg   MCHC 33.4 31.5 - 35.7 g/dL   RDW 12.7 11.7 - 15.4 %   Platelets 333 150 - 450 x10E3/uL   Neutrophils 49 Not Estab. %   Lymphs 41 Not Estab. %   Monocytes 6 Not Estab. %   Eos 2 Not Estab. %   Basos 1 Not Estab. %   Neutrophils Absolute 3.4 1.4 - 7.0 x10E3/uL   Lymphocytes Absolute 2.7 0.7 - 3.1 x10E3/uL   Monocytes Absolute 0.4 0.1 - 0.9 x10E3/uL   EOS (ABSOLUTE) 0.1 0.0 - 0.4 x10E3/uL   Basophils Absolute 0.0 0.0 - 0.2 x10E3/uL   Immature Granulocytes 1 Not Estab. %   Immature  Grans (Abs) 0.0 0.0 - 0.1 x10E3/uL  Comprehensive Metabolic Panel (CMET)  Result Value Ref Range   Glucose 106 (H) 65 - 99 mg/dL   BUN 9 6 - 20 mg/dL   Creatinine, Ser 0.80 0.57 - 1.00 mg/dL   eGFR 101 >59 mL/min/1.73   BUN/Creatinine Ratio 11 9 - 23   Sodium 139 134 - 144 mmol/L   Potassium 5.2 3.5 - 5.2 mmol/L   Chloride 103 96 - 106 mmol/L   CO2 18 (L) 20 - 29 mmol/L   Calcium 10.1 8.7 - 10.2 mg/dL   Total Protein 7.1 6.0 - 8.5 g/dL   Albumin  4.8 3.8 - 4.8 g/dL   Globulin, Total 2.3 1.5 - 4.5 g/dL   Albumin/Globulin Ratio 2.1 1.2 - 2.2   Bilirubin Total 0.4 0.0 - 1.2 mg/dL   Alkaline Phosphatase 109 44 - 121 IU/L   AST 16 0 - 40 IU/L   ALT 18 0 - 32 IU/L  HgB A1c  Result Value Ref Range   Hgb A1c MFr Bld 5.3 4.8 - 5.6 %   Est. average glucose Bld gHb Est-mCnc 105 mg/dL  Beta hCG quant (ref lab)  Result Value Ref Range   hCG Quant <1 mIU/mL  POCT urinalysis dipstick  Result Value Ref Range   Color, UA yellow    Clarity, UA clear    Glucose, UA Negative Negative   Bilirubin, UA negative    Ketones, UA small    Spec Grav, UA <=1.005 (A) 1.010 - 1.025   Blood, UA non hemolyzed moderate    pH, UA 6.5 5.0 - 8.0   Protein, UA Negative Negative   Urobilinogen, UA 0.2 0.2 or 1.0 E.U./dL   Nitrite, UA negative    Leukocytes, UA Negative Negative   Appearance     Odor    POCT urine pregnancy  Result Value Ref Range   Preg Test, Ur Negative Negative    Assessment & Plan    Urinary frequency - Plan: POCT urinalysis dipstick, POCT urine pregnancy, HgB A1c, Beta HCG, Quant  Vaginal bleeding - Plan: TSH, CBC with Differential/Platelet, Comprehensive Metabolic Panel (CMET), Beta HCG, Quant, CANCELED: B-HCG Quant  Orders Placed This Encounter  Procedures  . TSH  . CBC with Differential/Platelet  . Comprehensive Metabolic Panel (CMET)  . HgB A1c  . Beta HCG, Quant  . Beta hCG quant (ref lab)  . POCT urinalysis dipstick  . POCT urine pregnancy   Hold depo use  alternative method. Has been very stressed she stopped her Wellbutrin. Will start Wellbutrin back.   Gynecology referral if bleeding continues.    Return in about 1 week (around 04/21/2021), or if symptoms worsen or fail to improve, for at any time for any worsening symptoms, Go to Emergency room/ urgent care if worse.      The entirety of the information documented in the History of Present Illness, Review of Systems and Physical Exam were personally obtained by me. Portions of this information were initially documented by the CMA and reviewed by me for thoroughness and accuracy.   Red Flags discussed. The patient was given clear instructions to go to ER or return to medical center if any red flags develop, symptoms do not improve, worsen or new problems develop. They verbalized understanding.    Marcille Buffy, [place] [phone] (phone) [phone] (fax)  [place]

## 2021-04-15 LAB — TSH: TSH: 3.83 u[IU]/mL (ref 0.450–4.500)

## 2021-04-15 LAB — COMPREHENSIVE METABOLIC PANEL
ALT: 18 IU/L (ref 0–32)
AST: 16 IU/L (ref 0–40)
Albumin/Globulin Ratio: 2.1 (ref 1.2–2.2)
Albumin: 4.8 g/dL (ref 3.8–4.8)
Alkaline Phosphatase: 109 IU/L (ref 44–121)
BUN/Creatinine Ratio: 11 (ref 9–23)
BUN: 9 mg/dL (ref 6–20)
Bilirubin Total: 0.4 mg/dL (ref 0.0–1.2)
CO2: 18 mmol/L — ABNORMAL LOW (ref 20–29)
Calcium: 10.1 mg/dL (ref 8.7–10.2)
Chloride: 103 mmol/L (ref 96–106)
Creatinine, Ser: 0.8 mg/dL (ref 0.57–1.00)
Globulin, Total: 2.3 g/dL (ref 1.5–4.5)
Glucose: 106 mg/dL — ABNORMAL HIGH (ref 65–99)
Potassium: 5.2 mmol/L (ref 3.5–5.2)
Sodium: 139 mmol/L (ref 134–144)
Total Protein: 7.1 g/dL (ref 6.0–8.5)
eGFR: 101 mL/min/{1.73_m2} (ref >59)

## 2021-04-15 LAB — CBC WITH DIFFERENTIAL/PLATELET
Basophils Absolute: 0 10*3/uL (ref 0.0–0.2)
Basos: 1 %
EOS (ABSOLUTE): 0.1 10*3/uL (ref 0.0–0.4)
Eos: 2 %
Hematocrit: 39.5 % (ref 34.0–46.6)
Hemoglobin: 13.2 g/dL (ref 11.1–15.9)
Immature Grans (Abs): 0 10*3/uL (ref 0.0–0.1)
Immature Granulocytes: 1 %
Lymphocytes Absolute: 2.7 10*3/uL (ref 0.7–3.1)
Lymphs: 41 %
MCH: 28 pg (ref 26.6–33.0)
MCHC: 33.4 g/dL (ref 31.5–35.7)
MCV: 84 fL (ref 79–97)
Monocytes Absolute: 0.4 10*3/uL (ref 0.1–0.9)
Monocytes: 6 %
Neutrophils Absolute: 3.4 10*3/uL (ref 1.4–7.0)
Neutrophils: 49 %
Platelets: 333 10*3/uL (ref 150–450)
RBC: 4.72 x10E6/uL (ref 3.77–5.28)
RDW: 12.7 % (ref 11.7–15.4)
WBC: 6.6 10*3/uL (ref 3.4–10.8)

## 2021-04-15 LAB — BETA HCG QUANT (REF LAB): hCG Quant: <1 m[IU]/mL

## 2021-04-15 LAB — HEMOGLOBIN A1C
Est. average glucose Bld gHb Est-mCnc: 105 mg/dL
Hgb A1c MFr Bld: 5.3 % (ref 4.8–5.6)

## 2021-04-17 NOTE — Progress Notes (Signed)
Thyroid is within normal limits continue same dose of synthroid currently on.  Beta HCG shows no pregnancy if any question would repeat in 72 hours to 1 week.  CBC within normal limits.  CMP ok, glucose mild elevation add on Hemoglobin A1C is within normal not diabetic.

## 2021-04-17 NOTE — Progress Notes (Signed)
Thyroid is within normal limits continue same dose of synthroid currently on.  Beta HCG shows no pregnancy if any question would repeat in 72 hours to 1 week.  CBC within normal limits.  CMP ok, glucose mild elevation add on Hemoglobin A1C is within normal not diabetic.   Recheck TSH in 3 months lab, please add lab.

## 2021-04-20 ENCOUNTER — Encounter: Payer: Self-pay | Admitting: Adult Health

## 2021-04-24 ENCOUNTER — Ambulatory Visit: Payer: Medicaid Other | Admitting: Adult Health

## 2021-04-24 ENCOUNTER — Encounter: Payer: Self-pay | Admitting: Adult Health

## 2021-04-24 ENCOUNTER — Other Ambulatory Visit: Payer: Self-pay

## 2021-04-24 VITALS — BP 108/76 | HR 64 | Temp 97.7°F | Ht 64.0 in | Wt 199.4 lb

## 2021-04-24 DIAGNOSIS — Z3042 Encounter for surveillance of injectable contraceptive: Secondary | ICD-10-CM

## 2021-04-24 DIAGNOSIS — N939 Abnormal uterine and vaginal bleeding, unspecified: Secondary | ICD-10-CM | POA: Diagnosis not present

## 2021-04-24 DIAGNOSIS — Z30019 Encounter for initial prescription of contraceptives, unspecified: Secondary | ICD-10-CM | POA: Diagnosis not present

## 2021-04-24 LAB — POCT URINE PREGNANCY: Preg Test, Ur: NEGATIVE

## 2021-04-24 MED ORDER — MEDROXYPROGESTERONE ACETATE 150 MG/ML IM SUSP
150.0000 mg | Freq: Once | INTRAMUSCULAR | Status: AC
  Administered 2021-04-24: 150 mg via INTRAMUSCULAR

## 2021-04-24 NOTE — Patient Instructions (Signed)
Medroxyprogesterone injection [Contraceptive] What is this medicine? MEDROXYPROGESTERONE (me DROX ee proe JES te rone) contraceptive injections prevent pregnancy. They provide effective birth control for 3 months. Depo-SubQ Provera 104 injection is also used for treating pain related to endometriosis. This medicine may be used for other purposes; ask your health care provider or pharmacist if you have questions. COMMON BRAND NAME(S): Depo-Provera, Depo-subQ Provera 104 What should I tell my health care provider before I take this medicine? They need to know if you have any of these conditions:  asthma  blood clots  breast cancer or family history of breast cancer  depression  diabetes  eating disorder (anorexia nervosa)  heart attack  high blood pressure  HIV infection or AIDS  if you often drink alcohol  kidney disease  liver disease  migraine headaches  osteoporosis, weak bones  seizures  stroke  tobacco smoker  vaginal bleeding  an unusual or allergic reaction to medroxyprogesterone, other hormones, medicines, foods, dyes, or preservatives  pregnant or trying to get pregnant  breast-feeding How should I use this medicine? Depo-Provera CI contraceptive injection is given into a muscle. Depo-subQ Provera 104 injection is given under the skin. It is given by a health care provider in a hospital or clinic setting. The injection is usually given during the first 5 days after the start of a menstrual period or 6 weeks after delivery of a baby. A patient package insert for the product will be given with each prescription and refill. Be sure to read this information carefully each time. The sheet may change often. Talk to your pediatrician regarding the use of this medicine in children. Special care may be needed. These injections have been used in female children who have started having menstrual periods. Overdosage: If you think you have taken too much of this  medicine contact a poison control center or emergency room at once. NOTE: This medicine is only for you. Do not share this medicine with others. What if I miss a dose? Keep appointments for follow-up doses. You must get an injection once every 3 months. It is important not to miss your dose. Call your health care provider if you are unable to keep an appointment. What may interact with this medicine?  antibiotics or medicines for infections, especially rifampin and griseofulvin  antivirals for HIV or hepatitis  aprepitant  armodafinil  bexarotene  bosentan  medicines for seizures like carbamazepine, felbamate, oxcarbazepine, phenytoin, phenobarbital, primidone, topiramate  mitotane  modafinil  St. John's wort This list may not describe all possible interactions. Give your health care provider a list of all the medicines, herbs, non-prescription drugs, or dietary supplements you use. Also tell them if you smoke, drink alcohol, or use illegal drugs. Some items may interact with your medicine. What should I watch for while using this medicine? This drug does not protect you against HIV infection (AIDS) or other sexually transmitted diseases. Use of this product may cause you to lose calcium from your bones. Loss of calcium may cause weak bones (osteoporosis). Only use this product for more than 2 years if other forms of birth control are not right for you. The longer you use this product for birth control the more likely you will be at risk for weak bones. Ask your health care professional how you can keep strong bones. You may have a change in bleeding pattern or irregular periods. Many females stop having periods while taking this drug. If you have received your injections on time, your   chance of being pregnant is very low. If you think you may be pregnant, see your health care professional as soon as possible. Tell your health care professional if you want to get pregnant within the  next year. The effect of this medicine may last a long time after you get your last injection. What side effects may I notice from receiving this medicine? Side effects that you should report to your doctor or health care professional as soon as possible:  allergic reactions like skin rash, itching or hives, swelling of the face, lips, or tongue  blood clot (chest pain; shortness of breath; pain, swelling, or warmth in the leg)  breast tenderness or discharge  changes in emotions or moods  changes in vision  liver injury (dark yellow or brown urine; general ill feeling or flu-like symptoms; loss of appetite, right upper belly pain; unusually weak or tired, yellowing of the eyes or skin)  persistent pain, pus, or bleeding at the injection site  stroke (changes in vision; confusion; trouble speaking or understanding; severe headaches; sudden numbness or weakness of the face, arm or leg; trouble walking; dizziness; loss of balance or coordination)  trouble breathing Side effects that usually do not require medical attention (report to your doctor or health care professional if they continue or are bothersome):  change in sex drive  dizziness  fluid retention  headache  irregular periods, spotting, or absent periods  pain, redness, or irritation at site where injected  stomach pain  weight gain This list may not describe all possible side effects. Call your doctor for medical advice about side effects. You may report side effects to FDA at 1-800-FDA-1088. Where should I keep my medicine? This injection is only given by a health care provider. It will not be stored at home. NOTE: This sheet is a summary. It may not cover all possible information. If you have questions about this medicine, talk to your doctor, pharmacist, or health care provider.  2021 [employer] Standard (2020-02-03 10:29:21)  

## 2021-04-24 NOTE — Progress Notes (Signed)
Negative urine pregnancy. If any abnormal vaginal bleeding occurs recommend gynecology referral.

## 2021-04-24 NOTE — Progress Notes (Signed)
Acute Office Visit  Subjective:    Patient ID: Carolyn Bennett, female    DOB: Nov 25, 1991, 30 y.o.   MRN: [id number]  Chief Complaint  Patient presents with  . Contraception   HPI  Patient is in today for follow up she had vaginal bleeding at last visit that she does not usually have since she has been on Depo she has had Depo for years. She has been under a lot of stress the past few weeks.  TSH was checked since she is hypothyroid on Synthroid and levels within normal limits.    Still not start Wellbutrin back. Is using Hydroxyzine. .  Denies any vaginal bleeding, or spotting. Denies any cramping or back pain.  " feels well today "  Has no other concerns, " wants Depo"  Patient  denies any fever, body aches,chills, rash, chest pain, shortness of breath, nausea, vomiting, or diarrhea.  Denies dizziness, lightheadedness, pre syncopal or syncopal episodes.    Past Medical History:  Diagnosis Date  . Anxiety   . Depression   . Preeclampsia   . Thyroid disease     Past Surgical History:  Procedure Laterality Date  . CESAREAN SECTION N/A 05/22/2017   Procedure: STAT CESAREAN SECTION;  Surgeon: Benjaman Kindler, MD;  Location: [facility] ORS;  Service: Obstetrics;  Laterality: N/A;  . VAGINAL SEPTOPLASTY  05/22/2017   Procedure: VAGINAL Laceration repair;  Surgeon: Benjaman Kindler, MD;  Location: [facility] OR[facility];  Service: Obstetrics;;    Family History  Problem Relation Age of Onset  . Diabetes Father   . Cancer Father   . Cancer Maternal Grandmother   . Diabetes Maternal Grandmother     Social History   Socioeconomic History  . Marital status: Married    Spouse name: Not on file  . Number of children: Not on file  . Years of education: Not on file  . Highest education level: Not on file  Occupational History  . Not on file  Tobacco Use  . Smoking status: Former Research scientist (life sciences)  . Smokeless tobacco: Never Used  Substance and Sexual Activity  . Alcohol use: No  . Drug use: No  .  Sexual activity: Yes    Birth control/protection: Injection  Other Topics Concern  . Not on file  Social History Narrative  . Not on file   Social Determinants of Health   Financial Resource Strain: Not on file  Food Insecurity: Not on file  Transportation Needs: Not on file  Physical Activity: Not on file  Stress: Not on file  Social Connections: Not on file  Intimate Partner Violence: Not on file    Outpatient Medications Prior to Visit  Medication Sig Dispense Refill  . albuterol (VENTOLIN HFA) 108 (90 Base) MCG/ACT inhaler Inhale 1-2 puffs into the lungs every 6 (six) hours as needed for wheezing or shortness of breath. 18 g 0  . cyclobenzaprine (FLEXERIL) 10 MG tablet Take 1 tablet (10 mg total) by mouth 3 (three) times daily as needed for muscle spasms (follow up advised if not improving at anytime.). 30 tablet 0  . fluticasone (FLONASE) 50 MCG/ACT nasal spray Place 2 sprays into both nostrils daily. 16 g 6  . hydrOXYzine (ATARAX/VISTARIL) 10 MG tablet Take 1 tablet (10 mg total) by mouth 3 (three) times daily as needed. May take with her 50 mg tablet if needed for total of 60 mg TID PRN. 180 tablet 0  . hydrOXYzine (ATARAX/VISTARIL) 50 MG tablet Take 1 tablet (50 mg total) by  mouth 3 (three) times daily as needed (may cause drowsiness). 180 tablet 0  . levothyroxine (SYNTHROID) 88 MCG tablet Take 1 tablet (88 mcg total) by mouth daily. 90 tablet 1  . medroxyPROGESTERone (DEPO-PROVERA) 150 MG/ML injection Inject 1 mL (150 mg total) into the muscle every 3 (three) months. 1 mL 3  . Vitamin D, Ergocalciferol, (DRISDOL) 1.25 MG (50000 UNIT) CAPS capsule Take 1 capsule (50,000 Units total) by mouth every 7 (seven) days. For total of 12 weeks. Take ONE tablet ONCE weekly by mouth. Recheck lab 1- 2 weeks after complete. 12 capsule 0  . amoxicillin-clavulanate (AUGMENTIN) 875-125 MG tablet Take 1 tablet by mouth 2 (two) times daily. 20 tablet 0  . benzonatate (TESSALON) 200 MG capsule  Take 1 capsule (200 mg total) by mouth 2 (two) times daily as needed for cough. 20 capsule 0  . buPROPion (WELLBUTRIN XL) 300 MG 24 hr tablet Take 1 tablet (300 mg total) by mouth daily. (Patient not taking: Reported on 04/24/2021) 90 tablet 1  . fluconazole (DIFLUCAN) 150 MG tablet Take 1 tablet (150 mg total) by mouth as directed. Take one tablet by mouth on day 1. May repeat dose of one tablet by mouth on day four. 2 tablet 0  . guaiFENesin (MUCINEX) 600 MG 12 hr tablet Take 2 tablets (1,200 mg total) by mouth 2 (two) times daily. 28 tablet 0  . predniSONE (STERAPRED UNI-PAK 21 TAB) 10 MG (21) TBPK tablet PO: Take 6 tablets on day 1:Take 5 tablets day 2:Take 4 tablets day 3: Take 3 tablets day 4:Take 2 tablets day five: 5 Take 1 tablet day 6 21 tablet 0   No facility-administered medications prior to visit.    No Known Allergies  Review of Systems  Constitutional: Negative.   HENT: Negative.   Respiratory: Negative.   Cardiovascular: Negative.   Gastrointestinal: Negative.   Genitourinary: Negative.   Musculoskeletal: Negative.   Skin: Negative.   Neurological: Negative.   Hematological: Negative.   Psychiatric/Behavioral: The patient is nervous/anxious.        Objective:    Physical Exam Vitals reviewed.  Constitutional:      General: She is not in acute distress.    Appearance: She is well-developed. She is not diaphoretic.     Interventions: She is not intubated. HENT:     Head: Normocephalic and atraumatic.     Right Ear: External ear normal.     Left Ear: External ear normal.     Nose: Nose normal.     Mouth/Throat:     Pharynx: No oropharyngeal exudate.  Eyes:     General: Lids are normal. No scleral icterus.       Right eye: No discharge.        Left eye: No discharge.     Conjunctiva/sclera: Conjunctivae normal.     Right eye: Right conjunctiva is not injected. No exudate or hemorrhage.    Left eye: Left conjunctiva is not injected. No exudate or hemorrhage.     Pupils: Pupils are equal, round, and reactive to light.  Neck:     Thyroid: No thyroid mass or thyromegaly.     Vascular: Normal carotid pulses. No carotid bruit, hepatojugular reflux or JVD.     Trachea: Trachea and phonation normal. No tracheal tenderness or tracheal deviation.     Meningeal: Brudzinski's sign and Kernig's sign absent.  Cardiovascular:     Rate and Rhythm: Normal rate and regular rhythm.     Pulses: Normal pulses.  Radial pulses are 2+ on the right side and 2+ on the left side.       Dorsalis pedis pulses are 2+ on the right side and 2+ on the left side.       Posterior tibial pulses are 2+ on the right side and 2+ on the left side.     Heart sounds: Normal heart sounds, S1 normal and S2 normal. Heart sounds not distant. No murmur heard. No friction rub. No gallop.   Pulmonary:     Effort: Pulmonary effort is normal. No tachypnea, bradypnea, accessory muscle usage or respiratory distress. She is not intubated.     Breath sounds: Normal breath sounds. No stridor. No wheezing or rales.  Chest:     Chest wall: No tenderness.  Breasts:     Right: No supraclavicular adenopathy.     Left: No supraclavicular adenopathy.    Abdominal:     General: Bowel sounds are normal. There is no distension or abdominal bruit.     Palpations: Abdomen is soft. There is no shifting dullness, fluid wave, hepatomegaly, splenomegaly, mass or pulsatile mass.     Tenderness: There is no abdominal tenderness. There is no guarding or rebound.     Hernia: No hernia is present.  Musculoskeletal:        General: No tenderness or deformity. Normal range of motion.     Cervical back: Full passive range of motion without pain, normal range of motion and neck supple. No edema, erythema or rigidity. No spinous process tenderness or muscular tenderness. Normal range of motion.  Lymphadenopathy:     Head:     Right side of head: No submental, submandibular, tonsillar, preauricular, posterior  auricular or occipital adenopathy.     Left side of head: No submental, submandibular, tonsillar, preauricular, posterior auricular or occipital adenopathy.     Cervical: No cervical adenopathy.     Right cervical: No superficial, deep or posterior cervical adenopathy.    Left cervical: No superficial, deep or posterior cervical adenopathy.     Upper Body:     Right upper body: No supraclavicular or pectoral adenopathy.     Left upper body: No supraclavicular or pectoral adenopathy.  Skin:    General: Skin is warm and dry.     Coloration: Skin is not pale.     Findings: No abrasion, bruising, burn, ecchymosis, erythema, lesion, petechiae or rash.     Nails: There is no clubbing.  Neurological:     Mental Status: She is alert and oriented to person, place, and time.     GCS: GCS eye subscore is 4. GCS verbal subscore is 5. GCS motor subscore is 6.     Cranial Nerves: No cranial nerve deficit.     Sensory: No sensory deficit.     Motor: No tremor, atrophy, abnormal muscle tone or seizure activity.     Coordination: Coordination normal.     Gait: Gait normal.     Deep Tendon Reflexes: Reflexes are normal and symmetric. Reflexes normal. Babinski sign absent on the right side. Babinski sign absent on the left side.     Reflex Scores:      Tricep reflexes are 2+ on the right side and 2+ on the left side.      Bicep reflexes are 2+ on the right side and 2+ on the left side.      Brachioradialis reflexes are 2+ on the right side and 2+ on the left side.  Patellar reflexes are 2+ on the right side and 2+ on the left side.      Achilles reflexes are 2+ on the right side and 2+ on the left side. Psychiatric:        Speech: Speech normal.        Behavior: Behavior normal.        Thought Content: Thought content normal.        Judgment: Judgment normal.     BP 108/76 (BP Location: Left Arm, Patient Position: Sitting, Cuff Size: Large)   Pulse 64   Temp 97.7 F (36.5 C) (Oral)   Ht 5'  4" (1.626 m)   Wt 199 lb 6.4 oz (90.4 kg)   LMP  (LMP Unknown)   SpO2 97%   BMI 34.23 kg/m  Wt Readings from Last 3 Encounters:  04/24/21 199 lb 6.4 oz (90.4 kg)  07/29/20 196 lb (88.9 kg)  05/13/20 182 lb (82.6 kg)    Health Maintenance Due  Topic Date Due  . Hepatitis C Screening  Never done    There are no preventive care reminders to display for this patient.   Lab Results  Component Value Date   TSH 3.830 04/14/2021   Lab Results  Component Value Date   WBC 6.6 04/14/2021   HGB 13.2 04/14/2021   HCT 39.5 04/14/2021   MCV 84 04/14/2021   PLT 333 04/14/2021   Lab Results  Component Value Date   NA 139 04/14/2021   K 5.2 04/14/2021   CO2 18 (L) 04/14/2021   GLUCOSE 106 (H) 04/14/2021   BUN 9 04/14/2021   CREATININE 0.80 04/14/2021   BILITOT 0.4 04/14/2021   ALKPHOS 109 04/14/2021   AST 16 04/14/2021   ALT 18 04/14/2021   PROT 7.1 04/14/2021   ALBUMIN 4.8 04/14/2021   CALCIUM 10.1 04/14/2021   ANIONGAP 10 01/26/2020   EGFR 101 04/14/2021   Lab Results  Component Value Date   CHOL 200 01/26/2020   Lab Results  Component Value Date   HDL 39 (L) 01/26/2020   Lab Results  Component Value Date   LDLCALC 147 (H) 01/26/2020   Lab Results  Component Value Date   TRIG 71 01/26/2020   Lab Results  Component Value Date   CHOLHDL 5.1 01/26/2020   Lab Results  Component Value Date   HGBA1C 5.3 04/14/2021       Assessment & Plan:   Problem List Items Addressed This Visit   None   Visit Diagnoses    Vaginal bleeding    -  Primary   Relevant Orders   POCT urine pregnancy (Completed)   Encounter for female birth control       Relevant Medications   medroxyPROGESTERone (DEPO-PROVERA) injection 150 mg (Completed)   Other Relevant Orders   POCT urine pregnancy (Completed)       Meds ordered this encounter  Medications  . medroxyPROGESTERone (DEPO-PROVERA) injection 150 mg   Vaginal bleeding - Plan: POCT urine pregnancy  Encounter for  female birth control - Plan: POCT urine pregnancy, medroxyPROGESTERone (DEPO-PROVERA) injection 150 mg   Vaginal spotting was x 2 days at last visit. Patient was very stressed at time. Recently lost job. She has negative pregnancy test in office today. If any vaginal bleeding reoccurs will need to see gynecology as discussed with patient.   Thyroid lab was within normal limits. She will continue current dosage of Synthroid and will   Red Flags discussed. The patient was given clear instructions  to go to ER or return to medical center if any red flags develop, symptoms do not improve, worsen or new problems develop. They verbalized understanding.   The entirety of the information documented in the History of Present Illness, Review of Systems and Physical Exam were personally obtained by me. Portions of this information were initially documented by the CMA and reviewed by me for thoroughness and accuracy.     Marcille Buffy, FNP

## 2021-05-06 ENCOUNTER — Encounter: Payer: Self-pay | Admitting: Adult Health

## 2021-05-11 ENCOUNTER — Telehealth (INDEPENDENT_AMBULATORY_CARE_PROVIDER_SITE_OTHER): Payer: Medicaid Other | Admitting: Adult Health

## 2021-05-11 ENCOUNTER — Encounter: Payer: Self-pay | Admitting: Adult Health

## 2021-05-11 VITALS — Ht 64.02 in | Wt 199.0 lb

## 2021-05-11 DIAGNOSIS — J069 Acute upper respiratory infection, unspecified: Secondary | ICD-10-CM

## 2021-05-11 DIAGNOSIS — B9689 Other specified bacterial agents as the cause of diseases classified elsewhere: Secondary | ICD-10-CM

## 2021-05-11 MED ORDER — AMOXICILLIN-POT CLAVULANATE 875-125 MG PO TABS
1.0000 | ORAL_TABLET | Freq: Two times a day (BID) | ORAL | 0 refills | Status: DC
Start: 2021-05-11 — End: 2021-10-11

## 2021-05-11 MED ORDER — BENZONATATE 100 MG PO CAPS: 100.0000 mg | ORAL_CAPSULE | Freq: Two times a day (BID) | ORAL | 0 refills | Status: DC | PRN

## 2021-05-11 MED ORDER — PREDNISONE 10 MG (21) PO TBPK: ORAL_TABLET | ORAL | 0 refills | Status: DC

## 2021-05-11 NOTE — Patient Instructions (Signed)
Sinusitis, Adult Sinusitis is soreness and swelling (inflammation) of your sinuses. Sinuses are hollow spaces in the bones around your face. They are located:  Around your eyes.  In the middle of your forehead.  Behind your nose.  In your cheekbones. Your sinuses and nasal passages are lined with a fluid called mucus. Mucus drains out of your sinuses. Swelling can trap mucus in your sinuses. This lets germs (bacteria, virus, or fungus) grow, which leads to infection. Most of the time, this condition is caused by a virus. What are the causes? This condition is caused by:  Allergies.  Asthma.  Germs.  Things that block your nose or sinuses.  Growths in the nose (nasal polyps).  Chemicals or irritants in the air.  Fungus (rare). What increases the risk? You are more likely to develop this condition if:  You have a weak body defense system (immune system).  You do a lot of swimming or diving.  You use nasal sprays too much.  You smoke. What are the signs or symptoms? The main symptoms of this condition are pain and a feeling of pressure around the sinuses. Other symptoms include:  Stuffy nose (congestion).  Runny nose (drainage).  Swelling and warmth in the sinuses.  Headache.  Toothache.  A cough that may get worse at night.  Mucus that collects in the throat or the back of the nose (postnasal drip).  Being unable to smell and taste.  Being very tired (fatigue).  A fever.  Sore throat.  Bad breath. How is this diagnosed? This condition is diagnosed based on:  Your symptoms.  Your medical history.  A physical exam.  Tests to find out if your condition is short-term (acute) or long-term (chronic). Your doctor may: ? Check your nose for growths (polyps). ? Check your sinuses using a tool that has a light (endoscope). ? Check for allergies or germs. ? Do imaging tests, such as an MRI or CT scan. How is this treated? Treatment for this condition  depends on the cause and whether it is short-term or long-term.  If caused by a virus, your symptoms should go away on their own within 10 days. You may be given medicines to relieve symptoms. They include: ? Medicines that shrink swollen tissue in the nose. ? Medicines that treat allergies (antihistamines). ? A spray that treats swelling of the nostrils. ? Rinses that help get rid of thick mucus in your nose (nasal saline washes).  If caused by bacteria, your doctor may wait to see if you will get better without treatment. You may be given antibiotic medicine if you have: ? A very bad infection. ? A weak body defense system.  If caused by growths in the nose, you may need to have surgery. Follow these instructions at home: Medicines  Take, use, or apply over-the-counter and prescription medicines only as told by your doctor. These may include nasal sprays.  If you were prescribed an antibiotic medicine, take it as told by your doctor. Do not stop taking the antibiotic even if you start to feel better. Hydrate and humidify  Drink enough water to keep your pee (urine) pale yellow.  Use a cool mist humidifier to keep the humidity level in your home above 50%.  Breathe in steam for 10-15 minutes, 3-4 times a day, or as told by your doctor. You can do this in the bathroom while a hot shower is running.  Try not to spend time in cool or dry air.     Rest  Rest as much as you can.  Sleep with your head raised (elevated).  Make sure you get enough sleep each night. General instructions  Put a warm, moist washcloth on your face 3-4 times a day, or as often as told by your doctor. This will help with discomfort.  Wash your hands often with soap and water. If there is no soap and water, use hand sanitizer.  Do not smoke. Avoid being around people who are smoking (secondhand smoke).  Keep all follow-up visits as told by your doctor. This is important.   Contact a doctor if:  You  have a fever.  Your symptoms get worse.  Your symptoms do not get better within 10 days. Get help right away if:  You have a very bad headache.  You cannot stop throwing up (vomiting).  You have very bad pain or swelling around your face or eyes.  You have trouble seeing.  You feel confused.  Your neck is stiff.  You have trouble breathing. Summary  Sinusitis is swelling of your sinuses. Sinuses are hollow spaces in the bones around your face.  This condition is caused by tissues in your nose that become inflamed or swollen. This traps germs. These can lead to infection.  If you were prescribed an antibiotic medicine, take it as told by your doctor. Do not stop taking it even if you start to feel better.  Keep all follow-up visits as told by your doctor. This is important. This information is not intended to replace advice given to you by your health care provider. Make sure you discuss any questions you have with your health care provider. Document Revised: 05/19/2018 Document Reviewed: 05/19/2018 Elsevier Patient Education  2021 [employer]. Benzonatate capsules What is this medicine? BENZONATATE (ben ZOE na tate) is used to treat cough. This medicine may be used for other purposes; ask your health care provider or pharmacist if you have questions. COMMON BRAND NAME(S): Tessalon Perles, Zonatuss What should I tell my health care provider before I take this medicine? They need to know if you have any of these conditions:  kidney or liver disease  an unusual or allergic reaction to benzonatate, anesthetics, other medicines, foods, dyes, or preservatives  pregnant or trying to get pregnant  breast-feeding How should I use this medicine? Take this medicine by mouth with a glass of water. Follow the directions on the prescription label. Avoid breaking, chewing, or sucking the capsule, as this can cause serious side effects. Take your medicine at regular intervals. Do  not take your medicine more often than directed. Talk to your pediatrician regarding the use of this medicine in children. While this drug may be prescribed for children as young as 60 years old for selected conditions, precautions do apply. Overdosage: If you think you have taken too much of this medicine contact a poison control center or emergency room at once. NOTE: This medicine is only for you. Do not share this medicine with others. What if I miss a dose? If you miss a dose, take it as soon as you can. If it is almost time for your next dose, take only that dose. Do not take double or extra doses. What may interact with this medicine? Do not take this medicine with any of the following medications:  MAOIs like Carbex, Eldepryl, Marplan, Nardil, and Parnate This list may not describe all possible interactions. Give your health care provider a list of all the medicines, herbs, non-prescription drugs, or  dietary supplements you use. Also tell them if you smoke, drink alcohol, or use illegal drugs. Some items may interact with your medicine. What should I watch for while using this medicine? Tell your doctor if your symptoms do not improve or if they get worse. If you have a high fever, skin rash, or headache, see your health care professional. You may get drowsy or dizzy. Do not drive, use machinery, or do anything that needs mental alertness until you know how this medicine affects you. Do not sit or stand up quickly, especially if you are an older patient. This reduces the risk of dizzy or fainting spells. What side effects may I notice from receiving this medicine? Side effects that you should report to your doctor or health care professional as soon as possible:  allergic reactions like skin rash, itching or hives, swelling of the face, lips, or tongue  breathing problems  chest pain  confusion or hallucinations  irregular heartbeat  numbness of mouth or throat  seizures Side  effects that usually do not require medical attention (report to your doctor or health care professional if they continue or are bothersome):  burning feeling in the eyes  constipation  headache  nasal congestion  stomach upset This list may not describe all possible side effects. Call your doctor for medical advice about side effects. You may report side effects to FDA at 1-800-FDA-1088. Where should I keep my medicine? Keep out of the reach of children. Store at room temperature between 15 and 30 degrees C (59 and 86 degrees F). Keep tightly closed. Protect from light and moisture. Throw away any unused medicine after the expiration date. NOTE: This sheet is a summary. It may not cover all possible information. If you have questions about this medicine, talk to your doctor, pharmacist, or health care provider.  2021 Elsevier/Gold Standard (2008-03-17 14:52:56) Amoxicillin; Clavulanic Acid Tablets What is this medicine? AMOXICILLIN; CLAVULANIC ACID (a mox i SIL in; KLAV yoo lan ic AS id) is a penicillin antibiotic. It treats some infections caused by bacteria. It will not work for colds, the flu, or other viruses. This medicine may be used for other purposes; ask your health care provider or pharmacist if you have questions. COMMON BRAND NAME(S): Augmentin What should I tell my health care provider before I take this medicine? They need to know if you have any of these conditions:  kidney disease  liver disease  mononucleosis  stomach or intestine problems such as colitis  an unusual or allergic reaction to amoxicillin, other penicillin or cephalosporin antibiotics, clavulanic acid, other medicines, foods, dyes, or preservatives  pregnant or trying to get pregnant  breast-feeding How should I use this medicine? Take this drug by mouth. Take it as directed on the prescription label at the same time every day. Take it with food at the start of a meal or snack. Take all of this  drug unless your health care provider tells you to stop it early. Keep taking it even if you think you are better. Talk to your health care provider about the use of this drug in children. While it may be prescribed for selected conditions, precautions do apply. Overdosage: If you think you have taken too much of this medicine contact a poison control center or emergency room at once. NOTE: This medicine is only for you. Do not share this medicine with others. What if I miss a dose? If you miss a dose, take it as soon as you  can. If it is almost time for your next dose, take only that dose. Do not take double or extra doses. What may interact with this medicine?  allopurinol  anticoagulants  birth control pills  methotrexate  probenecid This list may not describe all possible interactions. Give your health care provider a list of all the medicines, herbs, non-prescription drugs, or dietary supplements you use. Also tell them if you smoke, drink alcohol, or use illegal drugs. Some items may interact with your medicine. What should I watch for while using this medicine? Tell your health care provider if your symptoms do not start to get better or if they get worse. This medicine may cause serious skin reactions. They can happen weeks to months after starting the medicine. Contact your health care provider right away if you notice fevers or flu-like symptoms with a rash. The rash may be red or purple and then turn into blisters or peeling of the skin. Or, you might notice a red rash with swelling of the face, lips or lymph nodes in your neck or under your arms. Do not treat diarrhea with over the counter products. Contact your health care provider if you have diarrhea that lasts more than 2 days or if it is severe and watery. If you have diabetes, you may get a false-positive result for sugar in your urine. Check with your health care provider. Birth control may not work properly while you are  taking this medicine. Talk to your health care provider about using an extra method of birth control. What side effects may I notice from receiving this medicine? Side effects that you should report to your doctor or health care provider as soon as possible:  allergic reactions (skin rash, itching or hives; swelling of the face, lips, or tongue)  bloody or watery diarrhea  dark urine  infection (fever, chills, cough, sore throat, or pain)  kidney injury (trouble passing urine or change in the amount of urine)  redness, blistering, peeling, or loosening of the skin, including inside the mouth  seizures  thrush (white patches in the mouth or mouth sores)  trouble breathing  unusual bruising or bleeding  unusually weak or tired Side effects that usually do not require medical attention (report to your doctor or health care provider if they continue or are bothersome):  diarrhea  dizziness  headache  nausea, vomiting  unusual vaginal discharge, itching, or odor  upset stomach This list may not describe all possible side effects. Call your doctor for medical advice about side effects. You may report side effects to FDA at 1-800-FDA-1088. Where should I keep my medicine? Keep out of the reach of children and pets. Store at room temperature between 20 and 25 degrees C (68 and 77 degrees F). Throw away any unused drug after the expiration date. NOTE: This sheet is a summary. It may not cover all possible information. If you have questions about this medicine, talk to your doctor, pharmacist, or health care provider.  2021 Elsevier/Gold Standard (2020-11-09 09:45:03) Benzonatate capsules What is this medicine? BENZONATATE (ben ZOE na tate) is used to treat cough. This medicine may be used for other purposes; ask your health care provider or pharmacist if you have questions. COMMON BRAND NAME(S): Tessalon Perles, Zonatuss What should I tell my health care provider before I  take this medicine? They need to know if you have any of these conditions:  kidney or liver disease  an unusual or allergic reaction to benzonatate, anesthetics, other  medicines, foods, dyes, or preservatives  pregnant or trying to get pregnant  breast-feeding How should I use this medicine? Take this medicine by mouth with a glass of water. Follow the directions on the prescription label. Avoid breaking, chewing, or sucking the capsule, as this can cause serious side effects. Take your medicine at regular intervals. Do not take your medicine more often than directed. Talk to your pediatrician regarding the use of this medicine in children. While this drug may be prescribed for children as young as 43 years old for selected conditions, precautions do apply. Overdosage: If you think you have taken too much of this medicine contact a poison control center or emergency room at once. NOTE: This medicine is only for you. Do not share this medicine with others. What if I miss a dose? If you miss a dose, take it as soon as you can. If it is almost time for your next dose, take only that dose. Do not take double or extra doses. What may interact with this medicine? Do not take this medicine with any of the following medications:  MAOIs like Carbex, Eldepryl, Marplan, Nardil, and Parnate This list may not describe all possible interactions. Give your health care provider a list of all the medicines, herbs, non-prescription drugs, or dietary supplements you use. Also tell them if you smoke, drink alcohol, or use illegal drugs. Some items may interact with your medicine. What should I watch for while using this medicine? Tell your doctor if your symptoms do not improve or if they get worse. If you have a high fever, skin rash, or headache, see your health care professional. You may get drowsy or dizzy. Do not drive, use machinery, or do anything that needs mental alertness until you know how this  medicine affects you. Do not sit or stand up quickly, especially if you are an older patient. This reduces the risk of dizzy or fainting spells. What side effects may I notice from receiving this medicine? Side effects that you should report to your doctor or health care professional as soon as possible:  allergic reactions like skin rash, itching or hives, swelling of the face, lips, or tongue  breathing problems  chest pain  confusion or hallucinations  irregular heartbeat  numbness of mouth or throat  seizures Side effects that usually do not require medical attention (report to your doctor or health care professional if they continue or are bothersome):  burning feeling in the eyes  constipation  headache  nasal congestion  stomach upset This list may not describe all possible side effects. Call your doctor for medical advice about side effects. You may report side effects to FDA at 1-800-FDA-1088. Where should I keep my medicine? Keep out of the reach of children. Store at room temperature between 15 and 30 degrees C (59 and 86 degrees F). Keep tightly closed. Protect from light and moisture. Throw away any unused medicine after the expiration date. NOTE: This sheet is a summary. It may not cover all possible information. If you have questions about this medicine, talk to your doctor, pharmacist, or health care provider.  2021 Elsevier/Gold Standard (2008-03-17 14:52:56)

## 2021-05-11 NOTE — Progress Notes (Signed)
Virtual Visit via Video Note  I connected with Carolyn Bennett on 05/11/21 at  1:30 PM EDT by a video enabled telemedicine application and verified that I am speaking with the correct person using two identifiers. Parties involved in visit as below:   Location: Patient: at home  Provider: Provider: Provider's office at  [facility], [place].      I discussed the limitations of evaluation and management by telemedicine and the availability of in person appointments. The patient expressed understanding and agreed to proceed.  History of Present Illness: Patient is a 30 year old female in no acute distress who presents for a virtual visit today.   Onset was 05/06/21.  She has sinus pressure and pain.  Has been trying over the counter medication and not feeling any better. Denies any fever, has had cold sweats.  Mucosus - blood small amount nose.  Colored green/ yellow. Thick mucous. Generalized dental pain.   Denies any known exposure.  Very bad sore throat. Denies any swallowing problems.   No LMP recorded (lmp unknown). Patient has had an injection.  She has not tested for covid.  Patient  denies any fever, body aches,chills, rash, chest pain, shortness of breath, nausea, vomiting, or diarrhea.  Denies dizziness, lightheadedness, pre syncopal or syncopal episodes.    Observations/Objective:   Patient is alert and oriented and responsive to questions Engages in conversation with provider. Speaks in full sentences without any pauses without any shortness of breath or distress.   Assessment and Plan: 1. Bacterial upper respiratory infection Suspect sinusitis given symptoms, cough is keeping her up at night.  Nasal saline, increased fluids and rest as well as other symptomatic management.  - amoxicillin-clavulanate (AUGMENTIN) 875-125 MG tablet; Take 1 tablet by mouth 2 (two) times daily.  Dispense: 20 tablet; Refill: 0 - predniSONE (STERAPRED  UNI-PAK 21 TAB) 10 MG (21) TBPK tablet; PO: Take 6 tablets on day 1:Take 5 tablets day 2:Take 4 tablets day 3: Take 3 tablets day 4:Take 2 tablets day five: 5 Take 1 tablet day 6  Dispense: 21 tablet; Refill: 0 - benzonatate (TESSALON) 100 MG capsule; Take 1 capsule (100 mg total) by mouth 2 (two) times daily as needed for cough.  Dispense: 30 capsule; Refill: 0  Follow Up Instructions:   Return in about 4 days (around 05/15/2021), or if symptoms worsen or fail to improve, for at any time for any worsening symptoms.  Advised in person evaluation at anytime is advised if any symptoms do not improve, worsen or change at any given time.  Red Flags discussed. The patient was given clear instructions to go to ER or return to medical center if any red flags develop, symptoms do not improve, worsen or new problems develop. They verbalized understanding. I discussed the assessment and treatment plan with the patient. The patient was provided an opportunity to ask questions and all were answered. The patient agreed with the plan and demonstrated an understanding of the instructions.   The patient was advised to call back or seek an in-person evaluation if the symptoms worsen or if the condition fails to improve as anticipated.     Carolyn Ben, FNP

## 2021-05-11 NOTE — Telephone Encounter (Signed)
Patient has an appointment with Marvell Fuller on 05/11/2021

## 2021-05-27 ENCOUNTER — Encounter: Payer: Self-pay | Admitting: Adult Health

## 2021-05-30 ENCOUNTER — Other Ambulatory Visit: Payer: Self-pay | Admitting: Adult Health

## 2021-05-30 DIAGNOSIS — F418 Other specified anxiety disorders: Secondary | ICD-10-CM

## 2021-05-30 MED ORDER — BUPROPION HCL ER (XL) 300 MG PO TB24
300.0000 mg | ORAL_TABLET | Freq: Every day | ORAL | 0 refills | Status: DC
Start: 2021-05-30 — End: 2021-08-31

## 2021-05-30 NOTE — Progress Notes (Signed)
No orders of the defined types were placed in this encounter.  Meds ordered this encounter  Medications  . buPROPion (WELLBUTRIN XL) 300 MG 24 hr tablet    Sig: Take 1 tablet (300 mg total) by mouth daily.    Dispense:  90 tablet    Refill:  0

## 2021-06-08 IMAGING — CR DG CHEST 2V
1 series · 2 of 2 positions shown · non-contrast
Comparison: 08/11/2015

CLINICAL DATA: Intermittent chest pain

EXAM:
CHEST - 2 VIEW

[Series 1: w chest pa · 0.14mm/px · 2 of 2 slices shown]
[im 1/2]
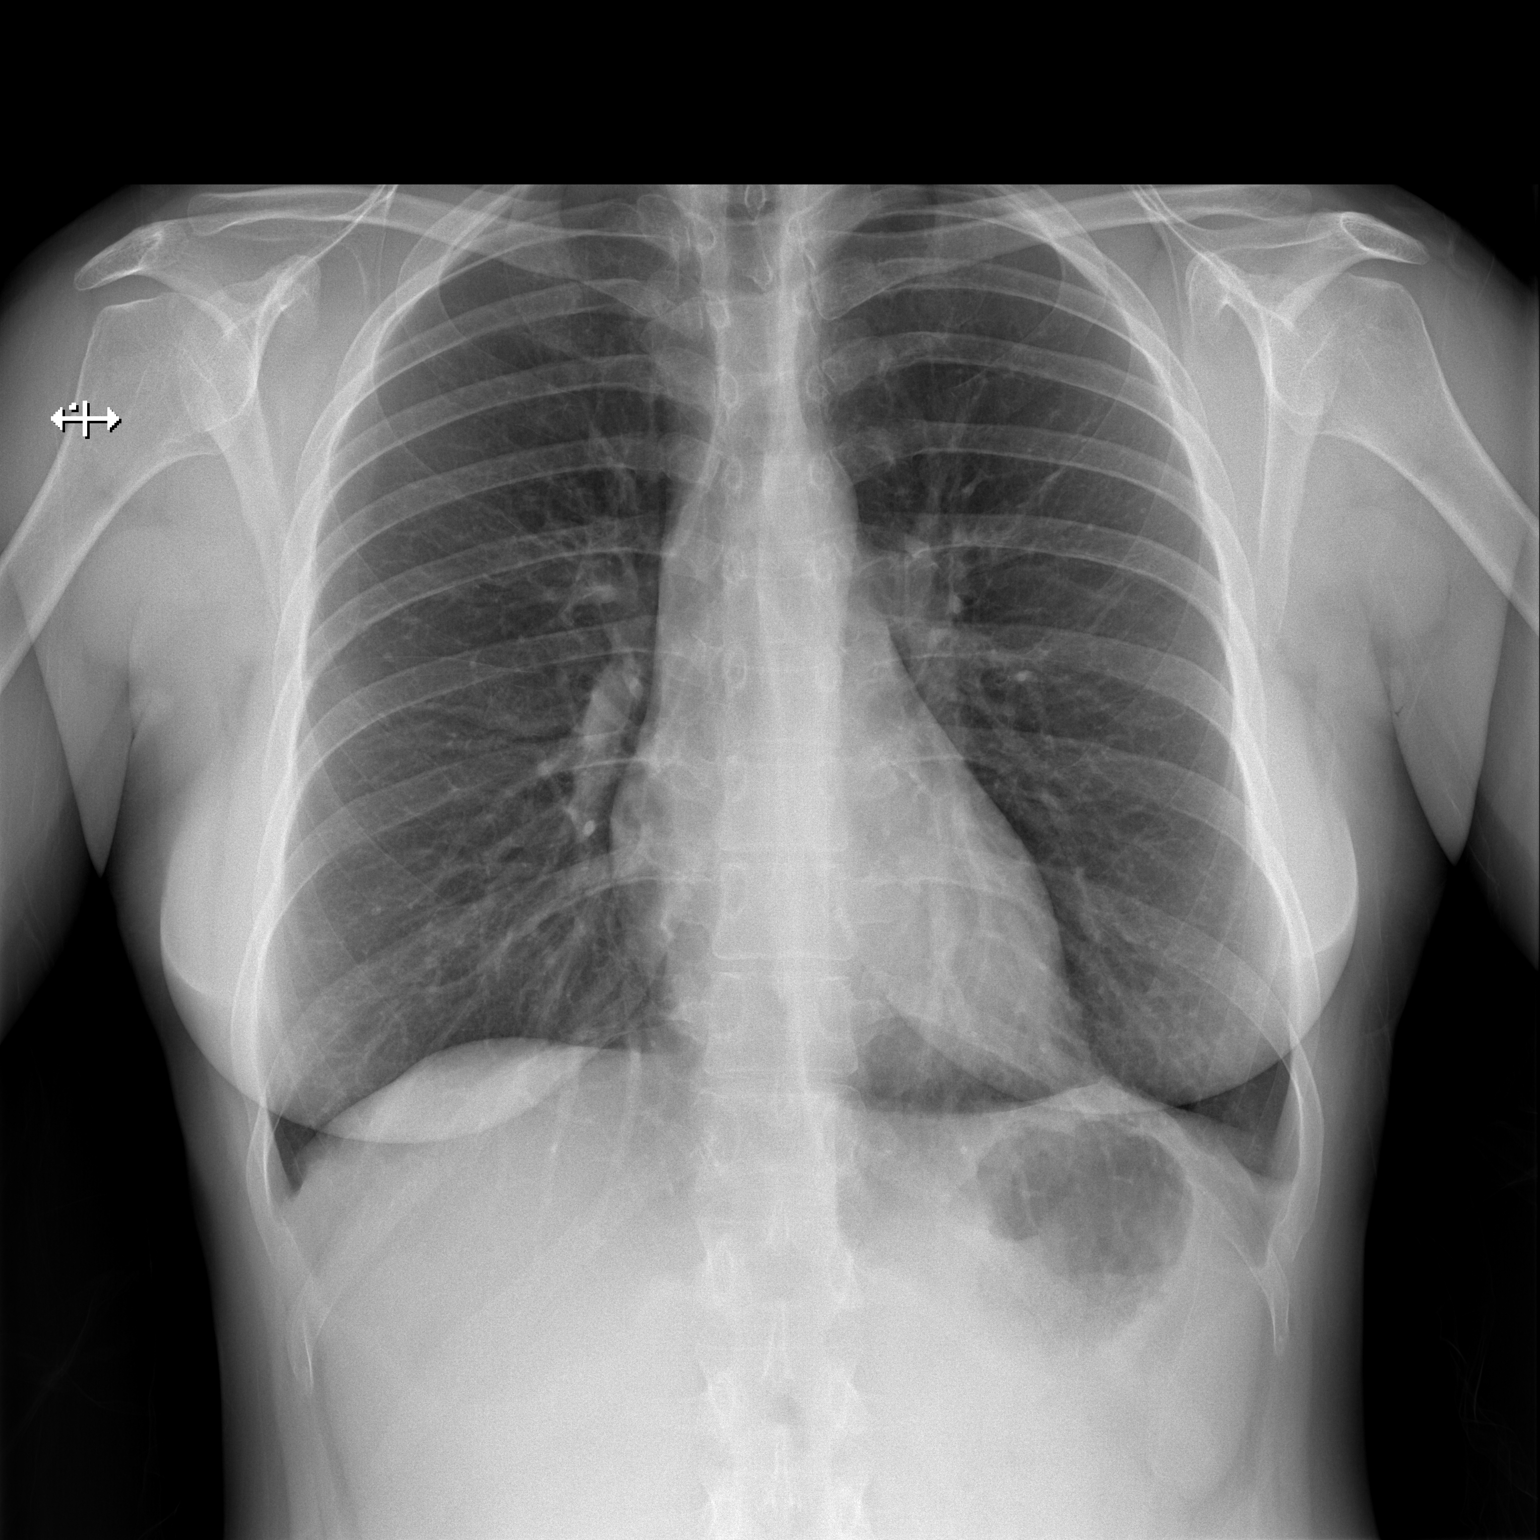
[im 2/2]
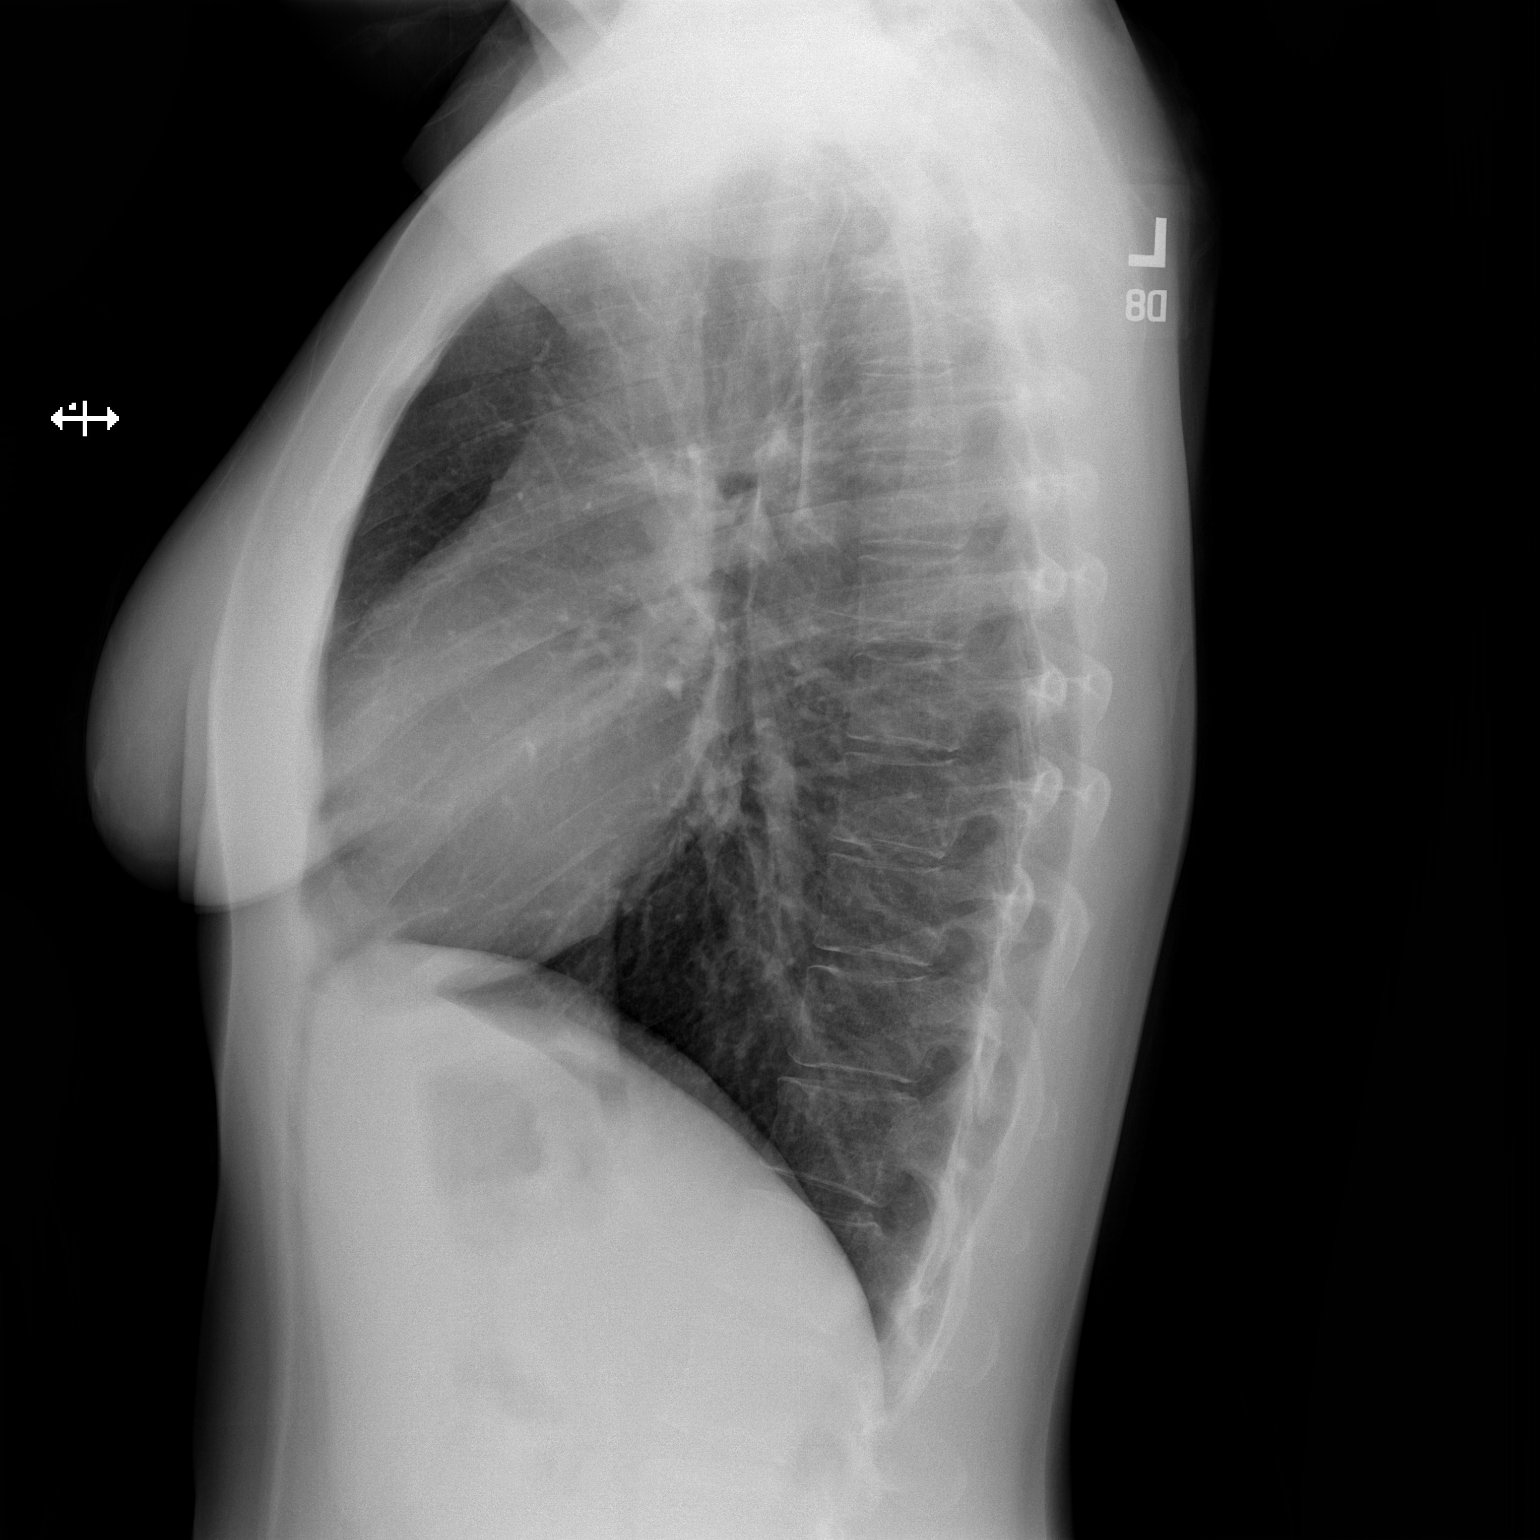

[2 of 2 positions shown; findings below may reference images not displayed]

FINDINGS: The heart size and mediastinal contours are within normal limits.
Both lungs are clear. The visualized skeletal structures are
unremarkable.
IMPRESSION: No acute abnormality of the lungs.

## 2021-06-25 ENCOUNTER — Other Ambulatory Visit: Payer: Self-pay | Admitting: Adult Health

## 2021-06-26 ENCOUNTER — Other Ambulatory Visit: Payer: Self-pay

## 2021-06-26 ENCOUNTER — Encounter: Payer: Self-pay | Admitting: Adult Health

## 2021-08-31 ENCOUNTER — Other Ambulatory Visit: Payer: Self-pay

## 2021-08-31 ENCOUNTER — Ambulatory Visit: Payer: Medicaid Other | Admitting: Family Medicine

## 2021-08-31 VITALS — BP 110/80 | HR 72 | Temp 98.2°F | Ht 64.0 in | Wt 199.4 lb

## 2021-08-31 DIAGNOSIS — F419 Anxiety disorder, unspecified: Secondary | ICD-10-CM

## 2021-08-31 DIAGNOSIS — F32A Depression, unspecified: Secondary | ICD-10-CM

## 2021-08-31 DIAGNOSIS — Z30011 Encounter for initial prescription of contraceptive pills: Secondary | ICD-10-CM

## 2021-08-31 DIAGNOSIS — E039 Hypothyroidism, unspecified: Secondary | ICD-10-CM | POA: Diagnosis not present

## 2021-08-31 LAB — POCT URINE PREGNANCY: Preg Test, Ur: NEGATIVE

## 2021-08-31 MED ORDER — DULOXETINE HCL 30 MG PO CPEP: ORAL_CAPSULE | ORAL | 1 refills | Status: DC

## 2021-08-31 NOTE — Assessment & Plan Note (Signed)
Check TSH.  Continue levothyroxine 88 mcg once daily.

## 2021-08-31 NOTE — Patient Instructions (Signed)
Nice to see you. We will check your thyroid function today. We are going to start you on Cymbalta to see if that will help with your anxiety.  This may take 6 to 8 weeks to become fully beneficial.  I will see you back in about 6 weeks. I would like to recheck a pregnancy test in 14 days prior to starting you on a progesterone only contraceptive.  I would recommend abstaining from intercourse until then though if you are sexually active you should use condoms.  If your urine pregnancy test is negative at that time we will proceed with the progesterone only contraceptive pill.

## 2021-08-31 NOTE — Progress Notes (Signed)
Marikay Alar, MD Phone: [phone]  Carolyn Bennett is a 30 y.o. female who presents today for follow-up.  Anxiety: Patient notes her anxiety has worsened quite a bit.  She lost her job and then had to move from her home where she had lived for 8 years.  The move was sudden as their landlord sold the home.  She also started in a new job.  She has not been taking the Wellbutrin recently.  She has been taking hydroxyzine 60 mg 3 times daily though at times has had to take up to 100 mg at night.  She does note some depression though no SI.  She has previously tried Lexapro though that caused headache, vomiting, and palpitations.  Hypothyroidism: She is taking Synthroid.  No skin changes.  No heat or cold intolerance.  Contraception: The patient was on Depo-Provera.  She missed her last injection which was due in July.  She has been on Depo-Provera since 2018 when she had her youngest child.  She does not smoke.  She has not had a menstrual cycle since she had her child.  She does have a history of migraines without aura.  She does report unprotected intercourse with her husband last night.  She does not desire an IUD for a Nexplanon.  Social History   Tobacco Use  Smoking Status Former  Smokeless Tobacco Never    Current Outpatient Medications on File Prior to Visit  Medication Sig Dispense Refill   albuterol (VENTOLIN HFA) 108 (90 Base) MCG/ACT inhaler Inhale 1-2 puffs into the lungs every 6 (six) hours as needed for wheezing or shortness of breath. 18 g 0   amoxicillin-clavulanate (AUGMENTIN) 875-125 MG tablet Take 1 tablet by mouth 2 (two) times daily. 20 tablet 0   cyclobenzaprine (FLEXERIL) 10 MG tablet Take 1 tablet (10 mg total) by mouth 3 (three) times daily as needed for muscle spasms (follow up advised if not improving at anytime.). 30 tablet 0   fluticasone (FLONASE) 50 MCG/ACT nasal spray Place 2 sprays into both nostrils daily. 16 g 6   hydrOXYzine (ATARAX/VISTARIL) 10 MG  tablet Take 1 tablet (10 mg total) by mouth 3 (three) times daily as needed. May take with her 50 mg tablet if needed for total of 60 mg TID PRN. 180 tablet 0   hydrOXYzine (ATARAX/VISTARIL) 50 MG tablet Take 1 tablet (50 mg total) by mouth 3 (three) times daily as needed (may cause drowsiness). 180 tablet 0   levothyroxine (SYNTHROID) 88 MCG tablet TAKE 1 TABLET BY MOUTH EVERY DAY 90 tablet 1   medroxyPROGESTERone (DEPO-PROVERA) 150 MG/ML injection Inject 1 mL (150 mg total) into the muscle every 3 (three) months. 1 mL 3   predniSONE (STERAPRED UNI-PAK 21 TAB) 10 MG (21) TBPK tablet PO: Take 6 tablets on day 1:Take 5 tablets day 2:Take 4 tablets day 3: Take 3 tablets day 4:Take 2 tablets day five: 5 Take 1 tablet day 6 21 tablet 0   Vitamin D, Ergocalciferol, (DRISDOL) 1.25 MG (50000 UNIT) CAPS capsule Take 1 capsule (50,000 Units total) by mouth every 7 (seven) days. For total of 12 weeks. Take ONE tablet ONCE weekly by mouth. Recheck lab 1- 2 weeks after complete. 12 capsule 0   No current facility-administered medications on file prior to visit.     ROS see history of present illness  Objective  Physical Exam Vitals:   08/31/21 1423  BP: 110/80  Pulse: 72  Temp: 98.2 F (36.8 C)  SpO2: 99%  BP Readings from Last 3 Encounters:  08/31/21 110/80  04/24/21 108/76  04/14/21 136/70   Wt Readings from Last 3 Encounters:  08/31/21 199 lb 6.4 oz (90.4 kg)  05/11/21 199 lb (90.3 kg)  04/24/21 199 lb 6.4 oz (90.4 kg)    Physical Exam Constitutional:      General: She is not in acute distress.    Appearance: She is not diaphoretic.  Cardiovascular:     Rate and Rhythm: Normal rate and regular rhythm.     Heart sounds: Normal heart sounds.  Pulmonary:     Effort: Pulmonary effort is normal.     Breath sounds: Normal breath sounds.  Skin:    General: Skin is warm and dry.  Neurological:     Mental Status: She is alert.     Assessment/Plan: Please see individual problem  list.  Problem List Items Addressed This Visit     Anxiety and depression - Primary    The patient has pretty significant anxiety.  She has mild depression.  Discussed trialing Cymbalta.  Advised that she is to stick to her hydroxyzine as prescribed.  Discussed if she had any worsening symptoms she should let us know.  Discussed risk of sleeping difficulty, sexual dysfunction, and weight gain with the Cymbalta.      Relevant Medications   DULoxetine (CYMBALTA) 30 MG capsule   Contraceptive management    The patient would like to switch from Depo-Provera to an alternative regimen.  She has noted some weight gain with the Depo-Provera.  She will have a urine pregnancy test today.  I will encourage condom use if she is sexually active for the next several weeks.  I discussed given her recent unprotected sex without the Depo-Provera in her system I would recommend a follow-up urine pregnancy test in 2 weeks.  If negative we can start her on progesterone only oral contraception.  Discussed that a combined OCP would not be a great option given her history of migraines.  She declines wanting to do an IUD or Nexplanon.  Discussed that she would have to take the progesterone only pill at the same time every day for it to be maximally effective.       Relevant Orders   POCT urine pregnancy   POCT urine pregnancy (Completed)   Hypothyroidism    Check TSH.  Continue levothyroxine 88 mcg once daily.      Relevant Orders   TSH    Return in 2 weeks (on 09/14/2021) for For a urine pregnancy test, 6 weeks with Birdie Sons or PCP.  This visit occurred during the SARS-CoV-2 public health emergency.  Safety protocols were in place, including screening questions prior to the visit, additional usage of staff PPE, and extensive cleaning of exam room while observing appropriate contact time as indicated for disinfecting solutions.    Marikay Alar, MD [facility]

## 2021-08-31 NOTE — Assessment & Plan Note (Addendum)
The patient would like to switch from Depo-Provera to an alternative regimen.  She has noted some weight gain with the Depo-Provera.  She will have a urine pregnancy test today.  I will encourage condom use if she is sexually active for the next several weeks.  I discussed given her recent unprotected sex without the Depo-Provera in her system I would recommend a follow-up urine pregnancy test in 2 weeks.  If negative we can start her on progesterone only oral contraception.  Discussed that a combined OCP would not be a great option given her history of migraines.  She declines wanting to do an IUD or Nexplanon.  Discussed that she would have to take the progesterone only pill at the same time every day for it to be maximally effective.

## 2021-08-31 NOTE — Assessment & Plan Note (Signed)
The patient has pretty significant anxiety.  She has mild depression.  Discussed trialing Cymbalta.  Advised that she is to stick to her hydroxyzine as prescribed.  Discussed if she had any worsening symptoms she should let us know.  Discussed risk of sleeping difficulty, sexual dysfunction, and weight gain with the Cymbalta.

## 2021-09-01 LAB — TSH: TSH: 4.7 u[IU]/mL (ref 0.35–5.50)

## 2021-09-14 ENCOUNTER — Other Ambulatory Visit: Payer: Self-pay | Admitting: Family Medicine

## 2021-09-14 ENCOUNTER — Other Ambulatory Visit: Payer: Self-pay

## 2021-09-14 ENCOUNTER — Other Ambulatory Visit (INDEPENDENT_AMBULATORY_CARE_PROVIDER_SITE_OTHER): Payer: Medicaid Other

## 2021-09-14 DIAGNOSIS — Z30011 Encounter for initial prescription of contraceptive pills: Secondary | ICD-10-CM | POA: Diagnosis not present

## 2021-09-14 LAB — POCT URINE PREGNANCY: Preg Test, Ur: NEGATIVE

## 2021-09-14 MED ORDER — NORETHINDRONE 0.35 MG PO TABS: 1.0000 | ORAL_TABLET | Freq: Every day | ORAL | 11 refills | Status: DC

## 2021-09-24 ENCOUNTER — Encounter: Payer: Self-pay | Admitting: Family Medicine

## 2021-09-24 ENCOUNTER — Other Ambulatory Visit: Payer: Self-pay | Admitting: Adult Health

## 2021-09-24 DIAGNOSIS — F418 Other specified anxiety disorders: Secondary | ICD-10-CM

## 2021-09-24 DIAGNOSIS — M545 Low back pain, unspecified: Secondary | ICD-10-CM

## 2021-09-25 ENCOUNTER — Encounter: Payer: Self-pay | Admitting: Family Medicine

## 2021-09-25 DIAGNOSIS — F418 Other specified anxiety disorders: Secondary | ICD-10-CM

## 2021-09-25 MED ORDER — HYDROXYZINE HCL 10 MG PO TABS: 10.0000 mg | ORAL_TABLET | Freq: Three times a day (TID) | ORAL | 0 refills | Status: DC | PRN

## 2021-09-25 MED ORDER — HYDROXYZINE HCL 50 MG PO TABS: 50.0000 mg | ORAL_TABLET | Freq: Three times a day (TID) | ORAL | 0 refills | Status: DC | PRN

## 2021-09-26 NOTE — Telephone Encounter (Signed)
See other mychart message.

## 2021-10-09 ENCOUNTER — Encounter: Payer: Self-pay | Admitting: Family Medicine

## 2021-10-10 ENCOUNTER — Telehealth: Payer: Medicaid Other | Admitting: Family

## 2021-10-10 ENCOUNTER — Encounter: Payer: Self-pay | Admitting: Family Medicine

## 2021-10-10 DIAGNOSIS — R509 Fever, unspecified: Secondary | ICD-10-CM

## 2021-10-10 DIAGNOSIS — R399 Unspecified symptoms and signs involving the genitourinary system: Secondary | ICD-10-CM

## 2021-10-11 ENCOUNTER — Telehealth: Payer: Self-pay | Admitting: Family

## 2021-10-11 ENCOUNTER — Other Ambulatory Visit: Payer: Self-pay

## 2021-10-11 ENCOUNTER — Other Ambulatory Visit (INDEPENDENT_AMBULATORY_CARE_PROVIDER_SITE_OTHER): Payer: Medicaid Other

## 2021-10-11 ENCOUNTER — Telehealth: Payer: Self-pay | Admitting: Adult Health

## 2021-10-11 ENCOUNTER — Other Ambulatory Visit: Payer: Self-pay | Admitting: Family

## 2021-10-11 ENCOUNTER — Encounter: Payer: Self-pay | Admitting: Family

## 2021-10-11 DIAGNOSIS — R300 Dysuria: Secondary | ICD-10-CM

## 2021-10-11 LAB — URINALYSIS, ROUTINE W REFLEX MICROSCOPIC
Bilirubin Urine: NEGATIVE
Ketones, ur: NEGATIVE
Nitrite: POSITIVE — AB
Specific Gravity, Urine: <=1.005 — AB (ref 1.000–1.030)
Total Protein, Urine: NEGATIVE
Urine Glucose: NEGATIVE
Urobilinogen, UA: 0.2 (ref 0.0–1.0)
pH: 6 (ref 5.0–8.0)

## 2021-10-11 MED ORDER — AMOXICILLIN-POT CLAVULANATE 875-125 MG PO TABS: 1.0000 | ORAL_TABLET | Freq: Two times a day (BID) | ORAL | 0 refills | Status: AC

## 2021-10-11 NOTE — Telephone Encounter (Signed)
Pt arrived for 7:30 labs. Urine sample was sent to lab during the 8:35 pick up on 10/11/21. Can not add on a POC UA.

## 2021-10-11 NOTE — Telephone Encounter (Signed)
Carolyn Bennett, azzel,   Can we order and result POC UA for this patient so I can decide on treatment?   She is in a lot of pain.

## 2021-10-11 NOTE — Telephone Encounter (Signed)
LMTCB

## 2021-10-11 NOTE — Telephone Encounter (Signed)
Patient returned office phone call about urine results, if any.

## 2021-10-11 NOTE — Telephone Encounter (Signed)
Patient calling in having a lot of pain and crying. Wanting to know if any of the results are back so that something can be sent in to help her today.

## 2021-10-11 NOTE — Progress Notes (Signed)
Based on what you shared with me, I feel your condition warrants further evaluation and I recommend that you be seen in a face to face visit.  Given your UTI symptoms, back pain, and fever you need to be seen in person for a urine sample to rule out a more serious infection.    NOTE: There will be NO CHARGE for this eVisit   If you are having a true medical emergency please call 911.      For an urgent face to face visit, [place] has six urgent care centers for your convenience:     [facility] at [facility] Directions [phone] [address] [place], [place] [address]    [facility] Center [facility]) Get Driving Directions [phone] [address] [place], [place] [address]  [facility] Urgent Care Center [facility]) Get Driving Directions [phone] [address] [place],  [place]  [address]  [facility] Health Urgent Care at [facility] Get Driving Directions [phone] [address] [place], [place] [address]   [facility] Health Urgent Care at [facility] Get Driving Directions  [phone] [address] [place], [place] [address]   [facility] Health Urgent Care at [facility] Directions [phone] [address] [place], [place] [address]  Your MyChart E-visit questionnaire answers were reviewed by a board certified advanced clinical practitioner to complete your personal care plan based on your specific symptoms.  Thank you for using e-Visits.

## 2021-10-11 NOTE — Telephone Encounter (Signed)
Let pt know urine was send out No result yet

## 2021-10-11 NOTE — Telephone Encounter (Signed)
Patient is calling to check on the results of her urine sample she dropped off this morning due to a visit she had through MyChart with Christy Hawks,NP.She stated she is still having symptoms and possibly a UTI,offered to transfer patient to Access Nurse and she stated she had a visit with a provider through MyChart.Please call the patient at [phone].

## 2021-10-12 ENCOUNTER — Ambulatory Visit: Payer: Medicaid Other | Admitting: Adult Health

## 2021-10-12 NOTE — Telephone Encounter (Signed)
See result note.  

## 2021-10-13 ENCOUNTER — Encounter: Payer: Self-pay | Admitting: Family

## 2021-10-13 ENCOUNTER — Encounter: Payer: Medicaid Other | Admitting: Family

## 2021-10-13 LAB — URINE CULTURE
MICRO NUMBER:: 12493710
SPECIMEN QUALITY:: ADEQUATE

## 2021-10-13 NOTE — Telephone Encounter (Signed)
Patient called back using her friends phone. While waiting online for her 10/13/21 2:30 pm virtual her phone died. States someone came online to speak with her at 2:45 pm. St this time it was almost 3:00 pm and Patient had to get back in to work.   Originally re-scheduled Patient to be seen Tuesday 10/17/21 with another provider. Treatment and evaluation already started by Rennie Plowman. Cancelled the Tuesday appointment and Patient states she is feeling better.   Patient would like a call or send mychart message if anything further is needed.

## 2021-10-13 NOTE — Progress Notes (Signed)
Verbal consent for services obtained from patient prior to services given to TELEPHONE visit:  ? ?Location of call: ? provider at work ?patient at home ? ?Names of all persons present for services: [name], NP and patient ?Chief complaint:  ? ? ? ? ?A/P/next steps: ? ?Problem List Items Addressed This Visit   ?None ?Visit Diagnoses   ? Erroneous encounter - disregard    -  Primary  ?  ? ? ? ? ?I spent 15 min  discussing plan of care over the phone.  ? ? ? ? ? ? ? ?This encounter was created in error - please disregard. ?

## 2021-10-17 ENCOUNTER — Telehealth: Payer: Medicaid Other | Admitting: Family

## 2021-11-01 ENCOUNTER — Encounter: Payer: Self-pay | Admitting: Nurse Practitioner

## 2021-11-01 ENCOUNTER — Other Ambulatory Visit: Payer: Self-pay

## 2021-11-01 ENCOUNTER — Encounter: Payer: Self-pay | Admitting: Adult Health

## 2021-11-01 ENCOUNTER — Telehealth (INDEPENDENT_AMBULATORY_CARE_PROVIDER_SITE_OTHER): Payer: Medicaid Other | Admitting: Nurse Practitioner

## 2021-11-01 ENCOUNTER — Other Ambulatory Visit: Payer: Medicaid Other

## 2021-11-01 VITALS — Ht 64.0 in | Wt 200.0 lb

## 2021-11-01 DIAGNOSIS — R059 Cough, unspecified: Secondary | ICD-10-CM | POA: Diagnosis not present

## 2021-11-01 DIAGNOSIS — R051 Acute cough: Secondary | ICD-10-CM | POA: Diagnosis not present

## 2021-11-01 DIAGNOSIS — J3489 Other specified disorders of nose and nasal sinuses: Secondary | ICD-10-CM

## 2021-11-01 DIAGNOSIS — R519 Headache, unspecified: Secondary | ICD-10-CM | POA: Diagnosis not present

## 2021-11-01 DIAGNOSIS — J101 Influenza due to other identified influenza virus with other respiratory manifestations: Secondary | ICD-10-CM | POA: Diagnosis not present

## 2021-11-01 DIAGNOSIS — J029 Acute pharyngitis, unspecified: Secondary | ICD-10-CM

## 2021-11-01 LAB — POC INFLUENZA A&B (BINAX/QUICKVUE)
Influenza A, POC: POSITIVE — AB
Influenza B, POC: NEGATIVE

## 2021-11-01 LAB — POCT RAPID STREP A (OFFICE): Rapid Strep A Screen: NEGATIVE

## 2021-11-01 MED ORDER — FLUTICASONE PROPIONATE 50 MCG/ACT NA SUSP: 2.0000 | Freq: Every day | NASAL | 0 refills | Status: DC

## 2021-11-01 MED ORDER — GUAIFENESIN-CODEINE 100-10 MG/5ML PO SOLN
5.0000 mL | Freq: Two times a day (BID) | ORAL | 0 refills | Status: AC | PRN
Start: 2021-11-01 — End: 2021-11-11

## 2021-11-01 NOTE — Patient Instructions (Signed)
Information in regards to influenza (the Flu)

## 2021-11-01 NOTE — Progress Notes (Signed)
Patient ID: Carolyn Bennett, female    DOB: [birth year], 30 y.o.   MRN: [id number]  Virtual visit completed through [employer], a video enabled telemedicine application. Due to national recommendations of social distancing due to COVID-19, a virtual visit is felt to be most appropriate for this patient at this time. Reviewed limitations, risks, security and privacy concerns of performing a virtual visit and the availability of in person appointments. I also reviewed that there may be a patient responsible charge related to this service. The patient agreed to proceed.   Patient location: home Provider location: [place] at [facility], office Persons participating in this virtual visit: patient, provider   If any vitals were documented, they were collected by patient at home unless specified below.    Ht 5' 4"  (1.626 m)   Wt 200 lb (90.7 kg)   BMI 34.33 kg/m    CC: Sore throat, Headache, Sinus Pressure, Nasal congestion  Subjective:   HPI: Carolyn Bennett is a 30 y.o. 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 States no chance of being pregnant   Relevant past medical, surgical, family and social history reviewed and updated as indicated. Interim medical history since our last visit reviewed. Allergies and medications reviewed and updated. Outpatient Medications Prior to Visit  Medication Sig Dispense Refill   hydrOXYzine (ATARAX/VISTARIL) 10 MG tablet Take 1 tablet (10 mg total) by mouth 3 (three) times daily as needed. May take with her 50 mg tablet if needed for total of 60 mg TID PRN. 180 tablet 0   hydrOXYzine (ATARAX/VISTARIL) 50 MG tablet Take 1 tablet (50 mg total) by mouth 3 (three) times daily as needed  (may cause drowsiness). 180 tablet 0   levothyroxine (SYNTHROID) 88 MCG tablet TAKE 1 TABLET BY MOUTH EVERY DAY 90 tablet 1   Vitamin D, Ergocalciferol, (DRISDOL) 1.25 MG (50000 UNIT) CAPS capsule Take 1 capsule (50,000 Units total) by mouth every 7 (seven) days. For total of 12 weeks. Take ONE tablet ONCE weekly by mouth. Recheck lab 1- 2 weeks after complete. 12 capsule 0   albuterol (VENTOLIN HFA) 108 (90 Base) MCG/ACT inhaler Inhale 1-2 puffs into the lungs every 6 (six) hours as needed for wheezing or shortness of breath. (Patient not taking: Reported on 10/13/2021) 18 g 0   cyclobenzaprine (FLEXERIL) 10 MG tablet Take 1 tablet (10 mg total) by mouth 3 (three) times daily as needed for muscle spasms (follow up advised if not improving at anytime.). (Patient not taking: No sig reported) 30 tablet 0   DULoxetine (CYMBALTA) 30 MG capsule Take 1 capsule (30 mg total) by mouth daily for 14 days, THEN 2 capsules (60 mg total) daily. 180 capsule 1   fluticasone (FLONASE) 50 MCG/ACT nasal spray Place 2 sprays into both nostrils daily. (Patient not taking: Reported on 10/13/2021) 16 g 6   norethindrone (ORTHO MICRONOR) 0.35 MG tablet Take 1 tablet (0.35 mg total) by mouth daily. (Patient not taking: Reported on 10/13/2021) 28 tablet 11   predniSONE (STERAPRED UNI-PAK 21 TAB) 10 MG (21) TBPK tablet PO: Take 6 tablets on day 1:Take 5 tablets day 2:Take 4 tablets day 3: Take 3 tablets day 4:Take 2 tablets day five: 5 Take 1 tablet day  6 (Patient not taking: Reported on 10/13/2021) 21 tablet 0   No facility-administered medications prior to visit.     Per HPI unless specifically indicated in ROS section below Review of Systems  Constitutional:  Positive for chills and fatigue. Negative for fever.  HENT:  Positive for congestion, ear pain (fullness), postnasal drip, sinus pressure and sore throat.   Respiratory:  Positive for cough (White/thick). Negative for shortness of breath.   Cardiovascular:  Positive  for chest pain (cough).  Gastrointestinal:  Negative for abdominal pain, diarrhea, nausea and vomiting.  Musculoskeletal:  Negative for arthralgias and myalgias.  Neurological:  Positive for headaches.  Objective:  Ht 5' 4"  (1.626 m)   Wt 200 lb (90.7 kg)   BMI 34.33 kg/m   Wt Readings from Last 3 Encounters:  11/01/21 200 lb (90.7 kg)  08/31/21 199 lb 6.4 oz (90.4 kg)  05/11/21 199 lb (90.3 kg)       Physical exam: Gen: alert, NAD, not ill appearing Pulm: speaks in complete sentences without increased work of breathing Psych: normal mood, normal thought content      Results for orders placed or performed in visit on 10/11/21  Urine Culture   Specimen: Urine  Result Value Ref Range   MICRO NUMBER: [id number]    SPECIMEN QUALITY: Adequate    Sample Source NOT GIVEN    STATUS: FINAL    ISOLATE 1: Escherichia coli (A)       Susceptibility   Escherichia coli - URINE CULTURE, REFLEX    AMOX/CLAVULANIC 4 Sensitive     AMPICILLIN >=32 Resistant     AMPICILLIN/SULBACTAM 16 Intermediate     CEFAZOLIN* <=4 Not Reportable      * For infections other than uncomplicated UTI caused by E. coli, K. pneumoniae or P. mirabilis: Cefazolin is resistant if MIC > or = 8 mcg/mL. (Distinguishing susceptible versus intermediate for isolates with MIC < or = 4 mcg/mL requires additional testing.) For uncomplicated UTI caused by E. coli, K. pneumoniae or P. mirabilis: Cefazolin is susceptible if MIC <32 mcg/mL and predicts susceptible to the oral agents cefaclor, cefdinir, cefpodoxime, cefprozil, cefuroxime, cephalexin and loracarbef.     CEFTAZIDIME <=1 Sensitive     CEFEPIME <=1 Sensitive     CEFTRIAXONE <=1 Sensitive     CIPROFLOXACIN <=0.25 Sensitive     LEVOFLOXACIN <=0.12 Sensitive     GENTAMICIN >=16 Resistant     IMIPENEM <=0.25 Sensitive     NITROFURANTOIN <=16 Sensitive     PIP/TAZO <=4 Sensitive     TOBRAMYCIN 8 Intermediate     TRIMETH/SULFA* >=320 Resistant      * For  infections other than uncomplicated UTI caused by E. coli, K. pneumoniae or P. mirabilis: Cefazolin is resistant if MIC > or = 8 mcg/mL. (Distinguishing susceptible versus intermediate for isolates with MIC < or = 4 mcg/mL requires additional testing.) For uncomplicated UTI caused by E. coli, K. pneumoniae or P. mirabilis: Cefazolin is susceptible if MIC <32 mcg/mL and predicts susceptible to the oral agents cefaclor, cefdinir, cefpodoxime, cefprozil, cefuroxime, cephalexin and loracarbef. Legend: S = Susceptible  I = Intermediate R = Resistant  NS = Not susceptible * = Not tested  NR = Not reported **NN = See antimicrobic comments   Urinalysis, Routine w reflex microscopic  Result Value Ref Range   Color, Urine YELLOW Yellow;Lt. Yellow;Straw;Dark Yellow;Amber;Green;Red;Brown   APPearance CLEAR Clear;Turbid;Slightly Cloudy;Cloudy   Specific Gravity, Urine <=1.005 (A) 1.000 - 1.030   pH 6.0 5.0 -  8.0   Total Protein, Urine NEGATIVE Negative   Urine Glucose NEGATIVE Negative   Ketones, ur NEGATIVE Negative   Bilirubin Urine NEGATIVE Negative   Hgb urine dipstick MODERATE (A) Negative   Urobilinogen, UA 0.2 0.0 - 1.0   Leukocytes,Ua MODERATE (A) Negative   Nitrite POSITIVE (A) Negative   WBC, UA 11-20/hpf (A) 0-2/hpf   RBC / HPF 0-2/hpf 0-2/hpf   Mucus, UA Presence of (A) None   Squamous Epithelial / LPF Rare(0-4/hpf) Rare(0-4/hpf)   Bacteria, UA Few(10-50/hpf) (A) None   Assessment & Plan:   Problem List Items Addressed This Visit       Respiratory   Influenza A    Patient with a myriad of symptoms.  Did a home COVID test that was negative.  Did come for curbside testing for strep and flu.  Flu came back positive for influenza A.  Did call and discussed this with patient also let her know since she is outside the window of 48 hours we cannot prescribe an antiviral.  We will send in medicine for help for symptomatic treatment and also write a work note for patient.  Continue  to monitor follow-up if fail to improve.      Other Visit Diagnoses     Sore throat    -  Primary   Relevant Orders   POCT rapid strep A (Completed)   Acute cough       Relevant Medications   guaiFENesin-codeine 100-10 MG/5ML syrup   Other Relevant Orders   POC Influenza A&B(BINAX/QUICKVUE) (Completed)   Nonintractable headache, unspecified chronicity pattern, unspecified headache type       Relevant Orders   POC Influenza A&B(BINAX/QUICKVUE) (Completed)   Sinus pressure       Relevant Medications   fluticasone (FLONASE) 50 MCG/ACT nasal spray        No orders of the defined types were placed in this encounter.  No orders of the defined types were placed in this encounter.   I discussed the assessment and treatment plan with the patient. The patient was provided an opportunity to ask questions and all were answered. The patient agreed with the plan and demonstrated an understanding of the instructions. The patient was advised to call back or seek an in-person evaluation if the symptoms worsen or if the condition fails to improve as anticipated.  Follow up plan: No follow-ups on file.  Romilda Garret, NP

## 2021-11-01 NOTE — Assessment & Plan Note (Signed)
Patient with a myriad of symptoms.  Did a home COVID test that was negative.  Did come for curbside testing for strep and flu.  Flu came back positive for influenza A.  Did call and discussed this with patient also let her know since she is outside the window of 48 hours we cannot prescribe an antiviral.  We will send in medicine for help for symptomatic treatment and also write a work note for patient.  Continue to monitor follow-up if fail to improve.

## 2021-11-13 ENCOUNTER — Encounter: Payer: Self-pay | Admitting: Adult Health

## 2021-11-14 ENCOUNTER — Encounter: Payer: Self-pay | Admitting: Nurse Practitioner

## 2021-11-14 ENCOUNTER — Other Ambulatory Visit: Payer: Self-pay | Admitting: Nurse Practitioner

## 2021-11-14 ENCOUNTER — Encounter: Payer: Self-pay | Admitting: Adult Health

## 2021-11-14 ENCOUNTER — Ambulatory Visit (INDEPENDENT_AMBULATORY_CARE_PROVIDER_SITE_OTHER): Payer: Medicaid Other | Admitting: Nurse Practitioner

## 2021-11-14 ENCOUNTER — Other Ambulatory Visit (INDEPENDENT_AMBULATORY_CARE_PROVIDER_SITE_OTHER): Payer: Medicaid Other

## 2021-11-14 ENCOUNTER — Ambulatory Visit: Payer: Medicaid Other | Admitting: Adult Health

## 2021-11-14 VITALS — Temp 99.8°F

## 2021-11-14 DIAGNOSIS — J029 Acute pharyngitis, unspecified: Secondary | ICD-10-CM | POA: Diagnosis not present

## 2021-11-14 LAB — POCT RAPID STREP A (OFFICE): Rapid Strep A Screen: NEGATIVE

## 2021-11-14 MED ORDER — IBUPROFEN 600 MG PO TABS: 600.0000 mg | ORAL_TABLET | Freq: Three times a day (TID) | ORAL | 0 refills | Status: AC | PRN

## 2021-11-14 NOTE — Progress Notes (Signed)
Patient ID: Carolyn Bennett, female    DOB: [birth year], 30 y.o.   MRN: [id number]  Virtual visit completed through [employer], a video enabled telemedicine application. Due to national recommendations of social distancing due to COVID-19, a virtual visit is felt to be most appropriate for this patient at this time. Reviewed limitations, risks, security and privacy concerns of performing a virtual visit and the availability of in person appointments. I also reviewed that there may be a patient responsible charge related to this service. The patient agreed to proceed.   Patient location: home Provider location: [place] [facility], office Persons participating in this virtual visit: patient, provider   If any vitals were documented, they were collected by patient at home unless specified below.    Temp 99.8 F (37.7 C) Comment: per patient   CC: Fever and sore throat  Subjective:   HPI: Carolyn Bennett is a 30 y.o. female presenting on 11/14/2021 for Sore Throat (White patches in the throat, sx started suddenly on 11/13/21, fever 101.5 yesterday. Has taking some Ibuprofen. Had flu 2 weeks ago)  Symptoms started 11/13/2021  Had flu approx 2 weeks ago and recovered well. Sore throat about 2 o'clock Did try some ibuprofen that did not help. States she took 800mg  after it started and took another 400mg  that did not help. Has also tried honey without great relief   Does have a history of strep as an adult. Last time was approx 3-4 years ago.        Relevant past medical, surgical, family and social history reviewed and updated as indicated. Interim medical history since our last visit reviewed. Allergies and medications reviewed and updated. Outpatient Medications Prior to Visit  Medication Sig Dispense Refill   fluticasone (FLONASE) 50 MCG/ACT nasal spray Place 2 sprays into both nostrils daily. 16 g 0   hydrOXYzine (ATARAX/VISTARIL) 10 MG tablet Take 1 tablet (10 mg total) by  mouth 3 (three) times daily as needed. May take with her 50 mg tablet if needed for total of 60 mg TID PRN. 180 tablet 0   hydrOXYzine (ATARAX/VISTARIL) 50 MG tablet Take 1 tablet (50 mg total) by mouth 3 (three) times daily as needed (may cause drowsiness). 180 tablet 0   levothyroxine (SYNTHROID) 88 MCG tablet TAKE 1 TABLET BY MOUTH EVERY DAY 90 tablet 1   Vitamin D, Ergocalciferol, (DRISDOL) 1.25 MG (50000 UNIT) CAPS capsule Take 1 capsule (50,000 Units total) by mouth every 7 (seven) days. For total of 12 weeks. Take ONE tablet ONCE weekly by mouth. Recheck lab 1- 2 weeks after complete. 12 capsule 0   norethindrone (MICRONOR) 0.35 MG tablet Take 1 tablet by mouth daily. (Patient not taking: Reported on 11/14/2021)     No facility-administered medications prior to visit.     Per HPI unless specifically indicated in ROS section below Review of Systems  Constitutional:  Positive for chills, fatigue and fever.  HENT:  Positive for sore throat. Negative for congestion.   Respiratory:  Negative for cough and shortness of breath.   Cardiovascular:  Negative for chest pain.  Gastrointestinal:  Negative for constipation, nausea and vomiting.  Musculoskeletal:  Negative for arthralgias and myalgias.  Neurological:  Negative for headaches.  Objective:  Temp 99.8 F (37.7 C) Comment: per patient  Wt Readings from Last 3 Encounters:  11/01/21 200 lb (90.7 kg)  08/31/21 199 lb 6.4 oz (90.4 kg)  05/11/21 199 lb (90.3 kg)  Physical exam: Gen: alert, NAD, not ill appearing Pulm: speaks in complete sentences without increased work of breathing Psych: normal mood, normal thought content      Results for orders placed or performed in visit on 11/01/21  POC Influenza A&B(BINAX/QUICKVUE)  Result Value Ref Range   Influenza A, POC Positive (A) Negative   Influenza B, POC Negative Negative  POCT rapid strep A  Result Value Ref Range   Rapid Strep A Screen Negative Negative   Assessment &  Plan:   Problem List Items Addressed This Visit       Other   Sore throat - Primary    Patient complains of a sudden onset sore throat with white patches.  Does have history of strep as an adult.  Has tried ibuprofen without great relief.  We will have her perform at home COVID test and schedule her for strep test.  Pending results      Relevant Orders   Rapid Strep A     No orders of the defined types were placed in this encounter.  No orders of the defined types were placed in this encounter.   I discussed the assessment and treatment plan with the patient. The patient was provided an opportunity to ask questions and all were answered. The patient agreed with the plan and demonstrated an understanding of the instructions. The patient was advised to call back or seek an in-person evaluation if the symptoms worsen or if the condition fails to improve as anticipated.  Follow up plan: No follow-ups on file.  Audria Nine, NP

## 2021-11-14 NOTE — Telephone Encounter (Signed)
Spoke with patient and [place] office. Patent will come there to be tested at 2:15 pm

## 2021-11-14 NOTE — Assessment & Plan Note (Addendum)
Patient complains of a sudden onset sore throat with white patches.  Does have history of strep as an adult.  Has tried ibuprofen without great relief.  We will have her perform at home COVID test and schedule her for strep test.  Pending results

## 2021-11-15 ENCOUNTER — Ambulatory Visit: Payer: Medicaid Other | Admitting: Adult Health

## 2021-11-15 ENCOUNTER — Encounter: Payer: Self-pay | Admitting: Adult Health

## 2021-11-15 ENCOUNTER — Other Ambulatory Visit: Payer: Self-pay

## 2021-11-15 VITALS — BP 126/74 | HR 81 | Temp 96.6°F | Ht 64.02 in | Wt 197.6 lb

## 2021-11-15 DIAGNOSIS — H6501 Acute serous otitis media, right ear: Secondary | ICD-10-CM | POA: Diagnosis not present

## 2021-11-15 DIAGNOSIS — R509 Fever, unspecified: Secondary | ICD-10-CM

## 2021-11-15 DIAGNOSIS — J101 Influenza due to other identified influenza virus with other respiratory manifestations: Secondary | ICD-10-CM

## 2021-11-15 DIAGNOSIS — E559 Vitamin D deficiency, unspecified: Secondary | ICD-10-CM

## 2021-11-15 DIAGNOSIS — R1310 Dysphagia, unspecified: Secondary | ICD-10-CM | POA: Diagnosis not present

## 2021-11-15 DIAGNOSIS — E039 Hypothyroidism, unspecified: Secondary | ICD-10-CM

## 2021-11-15 LAB — CBC WITH DIFFERENTIAL/PLATELET
Basophils Absolute: 0 10*3/uL (ref 0.0–0.1)
Basophils Relative: 0.3 % (ref 0.0–3.0)
Eosinophils Absolute: 0.1 10*3/uL (ref 0.0–0.7)
Eosinophils Relative: 1.4 % (ref 0.0–5.0)
HCT: 37.5 % (ref 36.0–46.0)
Hemoglobin: 12.7 g/dL (ref 12.0–15.0)
Lymphocytes Relative: 23.6 % (ref 12.0–46.0)
Lymphs Abs: 2.4 10*3/uL (ref 0.7–4.0)
MCHC: 33.7 g/dL (ref 30.0–36.0)
MCV: 83.8 fl (ref 78.0–100.0)
Monocytes Absolute: 1 10*3/uL (ref 0.1–1.0)
Monocytes Relative: 9.8 % (ref 3.0–12.0)
Neutro Abs: 6.6 10*3/uL (ref 1.4–7.7)
Neutrophils Relative %: 64.9 % (ref 43.0–77.0)
Platelets: 328 10*3/uL (ref 150.0–400.0)
RBC: 4.48 Mil/uL (ref 3.87–5.11)
RDW: 13.1 % (ref 11.5–15.5)
WBC: 10.2 10*3/uL (ref 4.0–10.5)

## 2021-11-15 LAB — COMPREHENSIVE METABOLIC PANEL
ALT: 30 U/L (ref 0–35)
AST: 16 U/L (ref 0–37)
Albumin: 4.4 g/dL (ref 3.5–5.2)
Alkaline Phosphatase: 85 U/L (ref 39–117)
BUN: 7 mg/dL (ref 6–23)
CO2: 25 mEq/L (ref 19–32)
Calcium: 9.2 mg/dL (ref 8.4–10.5)
Chloride: 106 mEq/L (ref 96–112)
Creatinine, Ser: 0.67 mg/dL (ref 0.40–1.20)
GFR: 116.24 mL/min (ref >60.00)
Glucose, Bld: 93 mg/dL (ref 70–99)
Potassium: 4.6 mEq/L (ref 3.5–5.1)
Sodium: 138 mEq/L (ref 135–145)
Total Bilirubin: 0.6 mg/dL (ref 0.2–1.2)
Total Protein: 7.5 g/dL (ref 6.0–8.3)

## 2021-11-15 LAB — TSH: TSH: 3.8 u[IU]/mL (ref 0.35–5.50)

## 2021-11-15 LAB — VITAMIN D 25 HYDROXY (VIT D DEFICIENCY, FRACTURES): VITD: 18.35 ng/mL — ABNORMAL LOW (ref 30.00–100.00)

## 2021-11-15 MED ORDER — CIPROFLOXACIN-HYDROCORTISONE 0.2-1 % OT SUSP: 3.0000 [drp] | Freq: Two times a day (BID) | OTIC | 0 refills | Status: DC

## 2021-11-15 MED ORDER — AMOXICILLIN-POT CLAVULANATE 875-125 MG PO TABS: 1.0000 | ORAL_TABLET | Freq: Two times a day (BID) | ORAL | 0 refills | Status: DC

## 2021-11-15 MED ORDER — PREDNISONE 10 MG (21) PO TBPK: ORAL_TABLET | ORAL | 0 refills | Status: DC

## 2021-11-15 NOTE — Progress Notes (Signed)
Acute Office Visit  Subjective:    Patient ID: Carolyn Bennett, female    DOB: 1991/03/09, 30 y.o.   MRN: [id number]  Chief Complaint  Patient presents with   Sore Throat   Ear Fullness   Cough     Sore Throat  This is a recurrent problem. The current episode started 1 to 4 weeks ago (flu two weeks ago). The problem has been gradually worsening. The maximum temperature recorded prior to her arrival was 100.4 - 100.9 F (waking up sweating, just never got better since flu). The fever has been present for 3 to 4 days. The pain is moderate. Associated symptoms include congestion, coughing (for three weeks green sputum), ear pain (right ear), headaches, swollen glands and trouble swallowing (painful). Pertinent negatives include no abdominal pain, diarrhea, drooling, ear discharge, hoarse voice, plugged ear sensation, neck pain, shortness of breath, stridor or vomiting. She has had no exposure to strep or mono.  Right ear pain, pain in throat.   Patient  denies any rash, chest pain, shortness of breath, nausea, vomiting, or diarrhea.    Past Medical History:  Diagnosis Date   Anxiety    Depression    Preeclampsia    Thyroid disease     Past Surgical History:  Procedure Laterality Date   CESAREAN SECTION N/A 05/22/2017   Procedure: STAT CESAREAN SECTION;  Surgeon: Benjaman Kindler, MD;  Location: [facility] ORS;  Service: Obstetrics;  Laterality: N/A;   VAGINAL SEPTOPLASTY  05/22/2017   Procedure: VAGINAL Laceration repair;  Surgeon: Benjaman Kindler, MD;  Location: [facility] ORS;  Service: Obstetrics;;    Family History  Problem Relation Age of Onset   Diabetes Father    Cancer Father    Cancer Maternal Grandmother    Diabetes Maternal Grandmother     Social History   Socioeconomic History   Marital status: Married    Spouse name: Not on file   Number of children: Not on file   Years of education: Not on file   Highest education level: Not on file  Occupational History   Not on  file  Tobacco Use   Smoking status: Former   Smokeless tobacco: Never  Substance and Sexual Activity   Alcohol use: No   Drug use: No   Sexual activity: Yes    Birth control/protection: Injection  Other Topics Concern   Not on file  Social History Narrative   Not on file   Social Determinants of Health   Financial Resource Strain: Not on file  Food Insecurity: Not on file  Transportation Needs: Not on file  Physical Activity: Not on file  Stress: Not on file  Social Connections: Not on file  Intimate Partner Violence: Not on file    Outpatient Medications Prior to Visit  Medication Sig Dispense Refill   fluticasone (FLONASE) 50 MCG/ACT nasal spray Place 2 sprays into both nostrils daily. 16 g 0   hydrOXYzine (ATARAX/VISTARIL) 10 MG tablet Take 1 tablet (10 mg total) by mouth 3 (three) times daily as needed. May take with her 50 mg tablet if needed for total of 60 mg TID PRN. 180 tablet 0   hydrOXYzine (ATARAX/VISTARIL) 50 MG tablet Take 1 tablet (50 mg total) by mouth 3 (three) times daily as needed (may cause drowsiness). 180 tablet 0   ibuprofen (ADVIL) 600 MG tablet Take 1 tablet (600 mg total) by mouth every 8 (eight) hours as needed for up to 7 days. 21 tablet 0   levothyroxine (SYNTHROID)  88 MCG tablet TAKE 1 TABLET BY MOUTH EVERY DAY 90 tablet 1   Vitamin D, Ergocalciferol, (DRISDOL) 1.25 MG (50000 UNIT) CAPS capsule Take 1 capsule (50,000 Units total) by mouth every 7 (seven) days. For total of 12 weeks. Take ONE tablet ONCE weekly by mouth. Recheck lab 1- 2 weeks after complete. 12 capsule 0   norethindrone (MICRONOR) 0.35 MG tablet Take 1 tablet by mouth daily. (Patient not taking: No sig reported)     No facility-administered medications prior to visit.    No Known Allergies  Review of Systems  Constitutional: Negative.   HENT:  Positive for congestion, ear pain (right ear), postnasal drip, rhinorrhea, sinus pressure, sinus pain, sore throat and trouble swallowing  (painful). Negative for dental problem, drooling, ear discharge, facial swelling, hearing loss, hoarse voice, mouth sores, nosebleeds, sneezing, tinnitus and voice change.   Eyes: Negative.   Respiratory:  Positive for cough (for three weeks green sputum). Negative for apnea, choking, chest tightness, shortness of breath, wheezing and stridor.   Gastrointestinal: Negative.  Negative for abdominal pain, diarrhea and vomiting.  Genitourinary: Negative.   Musculoskeletal: Negative.  Negative for neck pain.  Neurological:  Positive for headaches. Negative for dizziness, tremors, seizures, syncope, facial asymmetry, speech difficulty, weakness, light-headedness and numbness.  Psychiatric/Behavioral: Negative.        Objective:    Physical Exam Vitals reviewed.  Constitutional:      General: She is not in acute distress.    Appearance: She is well-developed. She is not ill-appearing or diaphoretic.  HENT:     Head: Normocephalic and atraumatic.     Right Ear: Tenderness present. No drainage or swelling. A middle ear effusion is present. Tympanic membrane is erythematous.     Left Ear: Tympanic membrane and ear canal normal. No drainage, swelling or tenderness.  No middle ear effusion. Tympanic membrane is not erythematous.     Nose: Congestion and rhinorrhea present.     Mouth/Throat:     Mouth: Mucous membranes are moist.     Pharynx: Uvula midline. Posterior oropharyngeal erythema present. No pharyngeal swelling, oropharyngeal exudate or uvula swelling.     Tonsils: No tonsillar exudate or tonsillar abscesses.  Eyes:     General: No scleral icterus.       Right eye: No discharge.        Left eye: No discharge.     Extraocular Movements:     Right eye: Normal extraocular motion.     Left eye: Normal extraocular motion.     Conjunctiva/sclera: Conjunctivae normal.     Pupils: Pupils are equal, round, and reactive to light.  Neck:     Vascular: No JVD.     Trachea: No tracheal  deviation.  Cardiovascular:     Rate and Rhythm: Normal rate and regular rhythm.     Heart sounds: Normal heart sounds. No murmur heard.   No friction rub. No gallop.  Pulmonary:     Effort: Pulmonary effort is normal. No respiratory distress.     Breath sounds: Normal breath sounds. No stridor. No wheezing or rales.  Chest:     Chest wall: No tenderness.  Abdominal:     General: Bowel sounds are normal.     Palpations: Abdomen is soft. There is mass.  Musculoskeletal:        General: Normal range of motion.     Cervical back: Normal range of motion and neck supple.  Lymphadenopathy:     Cervical: No cervical adenopathy.  Skin:    General: Skin is warm and dry.     Coloration: Skin is not pale.     Findings: No erythema or rash.  Neurological:     Mental Status: She is alert and oriented to person, place, and time.     Cranial Nerves: No cranial nerve deficit.     Motor: No abnormal muscle tone.     Coordination: Coordination normal.     Deep Tendon Reflexes: Reflexes normal.  Psychiatric:        Behavior: Behavior normal.        Thought Content: Thought content normal.        Judgment: Judgment normal.    BP 126/74   Pulse 81   Temp (!) 96.6 F (35.9 C)   Ht 5' 4.02" (1.626 m)   Wt 197 lb 9.6 oz (89.6 kg)   SpO2 94%   BMI 33.90 kg/m  Wt Readings from Last 3 Encounters:  11/15/21 197 lb 9.6 oz (89.6 kg)  11/01/21 200 lb (90.7 kg)  08/31/21 199 lb 6.4 oz (90.4 kg)    Health Maintenance Due  Topic Date Due   Hepatitis C Screening  Never done   INFLUENZA VACCINE  07/31/2021    There are no preventive care reminders to display for this patient.   Lab Results  Component Value Date   TSH 3.80 11/15/2021   Lab Results  Component Value Date   WBC 10.2 11/15/2021   HGB 12.7 11/15/2021   HCT 37.5 11/15/2021   MCV 83.8 11/15/2021   PLT 328.0 11/15/2021   Lab Results  Component Value Date   NA 138 11/15/2021   K 4.6 11/15/2021   CO2 25 11/15/2021    GLUCOSE 93 11/15/2021   BUN 7 11/15/2021   CREATININE 0.67 11/15/2021   BILITOT 0.6 11/15/2021   ALKPHOS 85 11/15/2021   AST 16 11/15/2021   ALT 30 11/15/2021   PROT 7.5 11/15/2021   ALBUMIN 4.4 11/15/2021   CALCIUM 9.2 11/15/2021   ANIONGAP 10 01/26/2020   EGFR 101 04/14/2021   GFR 116.24 11/15/2021   Lab Results  Component Value Date   CHOL 200 01/26/2020   Lab Results  Component Value Date   HDL 39 (L) 01/26/2020   Lab Results  Component Value Date   LDLCALC 147 (H) 01/26/2020   Lab Results  Component Value Date   TRIG 71 01/26/2020   Lab Results  Component Value Date   CHOLHDL 5.1 01/26/2020   Lab Results  Component Value Date   HGBA1C 5.3 04/14/2021       Assessment & Plan:   Problem List Items Addressed This Visit       Respiratory   Influenza A     Endocrine   Hypothyroidism   Relevant Orders   TSH (Completed)     Other   Fever and chills   Relevant Medications   amoxicillin-clavulanate (AUGMENTIN) 875-125 MG tablet   Vitamin D deficiency   Relevant Orders   VITAMIN D 25 Hydroxy (Vit-D Deficiency, Fractures) (Completed)   Other Visit Diagnoses     Non-recurrent acute serous otitis media of right ear    -  Primary   Relevant Medications   amoxicillin-clavulanate (AUGMENTIN) 875-125 MG tablet   ciprofloxacin-hydrocortisone (CIPRO HC OTIC) OTIC suspension   Other Relevant Orders   CBC with Differential/Platelet (Completed)   Comprehensive metabolic panel (Completed)   Painful swallowing       Relevant Medications   predniSONE (STERAPRED UNI-PAK 21  TAB) 10 MG (21) TBPK tablet     1. Non-recurrent acute serous otitis media of right ear - CBC with Differential/Platelet - Comprehensive metabolic panel - amoxicillin-clavulanate (AUGMENTIN) 875-125 MG tablet; Take 1 tablet by mouth 2 (two) times daily.  Dispense: 20 tablet; Refill: 0 - ciprofloxacin-hydrocortisone (CIPRO HC OTIC) OTIC suspension; Place 3 drops into the right ear 2 (two)  times daily.  Dispense: 10 mL; Refill: 0  2. Influenza A- 3 weeks ago.  3. Hypothyroidism, unspecified type - TSH  4. Fever and chills - amoxicillin-clavulanate (AUGMENTIN) 875-125 MG tablet; Take 1 tablet by mouth 2 (two) times daily.  Dispense: 20 tablet; Refill: 0  5. Vitamin D deficiency - VITAMIN D 25 Hydroxy (Vit-D Deficiency, Fractures)  6. Painful swallowing - predniSONE (STERAPRED UNI-PAK 21 TAB) 10 MG (21) TBPK tablet; PO: Take 6 tablets on day 1:Take 5 tablets day 2:Take 4 tablets day 3: Take 3 tablets day 4:Take 2 tablets day five: 5 Take 1 tablet day 6  Dispense: 21 tablet; Refill: 0    Meds ordered this encounter  Medications   amoxicillin-clavulanate (AUGMENTIN) 875-125 MG tablet    Sig: Take 1 tablet by mouth 2 (two) times daily.    Dispense:  20 tablet    Refill:  0   predniSONE (STERAPRED UNI-PAK 21 TAB) 10 MG (21) TBPK tablet    Sig: PO: Take 6 tablets on day 1:Take 5 tablets day 2:Take 4 tablets day 3: Take 3 tablets day 4:Take 2 tablets day five: 5 Take 1 tablet day 6    Dispense:  21 tablet    Refill:  0   ciprofloxacin-hydrocortisone (CIPRO HC OTIC) OTIC suspension    Sig: Place 3 drops into the right ear 2 (two) times daily.    Dispense:  10 mL    Refill:  0   Likely referred throat pain from ear., was negative for covid, flu, and rsv yesterday. Influenza positive 3 weeks ago suspect secondary bacterial infection. Ear on exam indicates infection right.   Return if symptoms worsen or fail to improve, for at any time for any worsening symptoms, Go to Emergency room/ urgent care if worse.    Marcille Buffy, FNP

## 2021-11-15 NOTE — Telephone Encounter (Signed)
Patient was asking for a strep culture. I told her we would likely need to reswab her. Also she is interested in the mono blood test. Is there a way we could reach out to her and get her an appointment for reswab with culture and a monospot test

## 2021-11-15 NOTE — Patient Instructions (Signed)
Prednisolone Tablets What is this medication? PREDNISOLONE (pred NISS oh lone) treats many conditions such as asthma, allergic reactions, arthritis, inflammatory bowel diseases, adrenal, and blood or bone marrow disorders. It works by decreasing inflammation, slowing down an overactive immune system, or replacing cortisol normally made in the body. Cortisol is a hormone that plays an important role in how the body responds to stress, illness, and injury. It belongs to a group of medications called steroids. This medicine may be used for other purposes; ask your health care provider or pharmacist if you have questions. COMMON BRAND NAME(S): Millipred, Millipred DP, Millipred DP 12-Day, Millipred DP 6 Day, Prednoral What should I tell my care team before I take this medication? They need to know if you have any of these conditions: Cushing's syndrome Diabetes Glaucoma Heart problems or disease High blood pressure Infection such as herpes, measles, tuberculosis, or chickenpox Kidney disease Liver disease Mental problems Myasthenia gravis Osteoporosis Seizures Stomach ulcer or intestine disease including colitis and diverticulitis Thyroid problem An unusual or allergic reaction to lactose, prednisolone, other medications, foods, dyes, or preservatives Pregnant or trying to get pregnant Breast-feeding How should I use this medication? Take this medication by mouth with a glass of water. Follow the directions on the prescription label. Take it with food or milk to avoid stomach upset. If you are taking this medication once a day, take it in the morning. Do not take more medication than you are told to take. Do not suddenly stop taking your medication because you may develop a severe reaction. Your care team will tell you how much medication to take. If your care team wants you to stop the medication, the dose may be slowly lowered over time to avoid any side effects. Talk to your care team about  the use of this medication in children. Special care may be needed. Overdosage: If you think you have taken too much of this medicine contact a poison control center or emergency room at once. NOTE: This medicine is only for you. Do not share this medicine with others. What if I miss a dose? If you miss a dose, take it as soon as you can. If it is almost time for your next dose, take only that dose. Do not take double or extra doses. What may interact with this medication? Do not take this medication with any of the following: Metyrapone Mifepristone This medication may also interact with the following: Aminoglutethimide Amphotericin B Aspirin and aspirin-like medications Barbiturates Certain medications for diabetes, like glipizide or glyburide Cholestyramine Cholinesterase inhibitors Cyclosporine Digoxin Diuretics Ephedrine Female hormones, like estrogens and birth control pills Isoniazid Ketoconazole NSAIDS, medications for pain and inflammation, like ibuprofen or naproxen Phenytoin Rifampin Toxoids Vaccines Warfarin This list may not describe all possible interactions. Give your health care provider a list of all the medicines, herbs, non-prescription drugs, or dietary supplements you use. Also tell them if you smoke, drink alcohol, or use illegal drugs. Some items may interact with your medicine. What should I watch for while using this medication? Visit your care team for regular checks on your progress. If you are taking this medication over a prolonged period, carry an identification card with your name and address, the type and dose of your medication, and your care team's name and address. This medication may increase your risk of getting an infection. Tell your care team if you are around anyone with measles or chickenpox, or if you develop sores or blisters that do not heal properly.  If you are going to have surgery, tell your care team that you have taken this  medication within the last twelve months. Ask your care team about your diet. You may need to lower the amount of salt you eat. This medication may increase blood sugar. Ask your care team if changes in diet or medications are needed if you have diabetes. What side effects may I notice from receiving this medication? Side effects that you should report to your care team as soon as possible: Allergic reactions--skin rash, itching, hives, swelling of the face, lips, tongue, or throat Cushing syndrome--increased fat around the midsection, upper back, neck, or face, pink or purple stretch marks on the skin, thinning, fragile skin that easily bruises, unexpected hair growth High blood sugar (hyperglycemia)--increased thirst or amount of urine, unusual weakness or fatigue, blurry vision Increase in blood pressure Infection--fever, chills, cough, sore throat, wounds that don't heal, pain or trouble when passing urine, general feeling of discomfort or being unwell Low adrenal gland function--nausea, vomiting, loss of appetite, unusual weakness or fatigue, dizziness Mood and behavior changes--anxiety, nervousness, confusion, hallucinations, irritability, hostility, thoughts of suicide or self-harm, worsening mood, feelings of depression Stomach bleeding--bloody or black, tar-like stools, vomiting blood or brown material that looks like coffee grounds Swelling of the ankles, hands, or feet Side effects that usually do not require medical attention (report to your care team if they continue or are bothersome): Acne General discomfort and fatigue Headache Increase in appetite Nausea Trouble sleeping Weight gain This list may not describe all possible side effects. Call your doctor for medical advice about side effects. You may report side effects to FDA at 1-800-FDA-1088. Where should I keep my medication? Keep out of the reach of children. Store at room temperature between 15 and 30 degrees C (59 and  86 degrees F). Keep container tightly closed. Throw away any unused medication after the expiration date. NOTE: This sheet is a summary. It may not cover all possible information. If you have questions about this medicine, talk to your doctor, pharmacist, or health care provider.  2022 Elsevier/Gold Standard (2021-03-17 00:00:00) Amoxicillin; Clavulanic Acid Tablets What is this medication? AMOXICILLIN; CLAVULANIC ACID (a mox i SIL in; KLAV yoo lan ic AS id) treats infections caused by bacteria. It belongs to a group of medications called penicillin antibiotics. It will not treat colds, the flu, or infections caused by viruses. This medicine may be used for other purposes; ask your health care provider or pharmacist if you have questions. COMMON BRAND NAME(S): Augmentin What should I tell my care team before I take this medication? They need to know if you have any of these conditions: Kidney disease Liver disease Mononucleosis Stomach or intestine problems such as colitis An unusual or allergic reaction to amoxicillin, other penicillin or cephalosporin antibiotics, clavulanic acid, other medications, foods, dyes, or preservatives Pregnant or trying to get pregnant Breast-feeding How should I use this medication? Take this medication by mouth. Take it as directed on the prescription label at the same time every day. Take it with food at the start of a meal or snack. Take all of this medication unless your care team tells you to stop it early. Keep taking it even if you think you are better. Talk to your care team about the use of this medication in children. While it may be prescribed for selected conditions, precautions do apply. Overdosage: If you think you have taken too much of this medicine contact a poison control center or  emergency room at once. NOTE: This medicine is only for you. Do not share this medicine with others. What if I miss a dose? If you miss a dose, take it as soon as  you can. If it is almost time for your next dose, take only that dose. Do not take double or extra doses. What may interact with this medication? Allopurinol Anticoagulants Birth control pills Methotrexate Probenecid This list may not describe all possible interactions. Give your health care provider a list of all the medicines, herbs, non-prescription drugs, or dietary supplements you use. Also tell them if you smoke, drink alcohol, or use illegal drugs. Some items may interact with your medicine. What should I watch for while using this medication? Tell your care team if your symptoms do not start to get better or if they get worse. This medication may cause serious skin reactions. They can happen weeks to months after starting the medication. Contact your care team right away if you notice fevers or flu-like symptoms with a rash. The rash may be red or purple and then turn into blisters or peeling of the skin. Or, you might notice a red rash with swelling of the face, lips or lymph nodes in your neck or under your arms. Do not treat diarrhea with over the counter products. Contact your care team if you have diarrhea that lasts more than 2 days or if it is severe and watery. If you have diabetes, you may get a false-positive result for sugar in your urine. Check with your care team. Birth control may not work properly while you are taking this medication. Talk to your care team about using an extra method of birth control. What side effects may I notice from receiving this medication? Side effects that you should report to your care team as soon as possible: Allergic reactions--skin rash, itching, hives, swelling of the face, lips, tongue, or throat Liver injury--right upper belly pain, loss of appetite, nausea, light-colored stool, dark yellow or brown urine, yellowing skin or eyes, unusual weakness or fatigue Redness, blistering, peeling, or loosening of the skin, including inside the  mouth Severe diarrhea, fever Unusual vaginal discharge, itching, or odor Side effects that usually do not require medical attention (report to your care team if they continue or are bothersome): Diarrhea Nausea Vomiting This list may not describe all possible side effects. Call your doctor for medical advice about side effects. You may report side effects to FDA at 1-800-FDA-1088. Where should I keep my medication? Keep out of the reach of children and pets. Store at room temperature between 20 and 25 degrees C (68 and 77 degrees F). Throw away any unused medication after the expiration date. NOTE: This sheet is a summary. It may not cover all possible information. If you have questions about this medicine, talk to your doctor, pharmacist, or health care provider.  2022 Elsevier/Gold Standard (2020-12-11 00:00:00) Otitis Media, Adult Otitis media occurs when there is inflammation and fluid in the middle ear with signs and symptoms of an acute infection. The middle ear is a part of the ear that contains bones for hearing as well as air that helps send sounds to the brain. When infected fluid builds up in this space, it causes pressure and can lead to an ear infection. The eustachian tube connects the middle ear to the back of the nose (nasopharynx) and normally allows air into the middle ear. If the eustachian tube becomes blocked, fluid can build up and become infected.  What are the causes? This condition is caused by a blockage in the eustachian tube. This can be caused by mucus or by swelling of the tube. Problems that can cause a blockage include: A cold or other upper respiratory infection. Allergies. An irritant, such as tobacco smoke. Enlarged adenoids. The adenoids are areas of soft tissue located high in the back of the throat, behind the nose and the roof of the mouth. They are part of the body's defense system (immune system). A mass in the nasopharynx. Damage to the ear caused by  pressure changes (barotrauma). What increases the risk? You are more likely to develop this condition if you: Smoke or are exposed to tobacco smoke. Have an opening in the roof of your mouth (cleft palate). Have gastroesophageal reflux. Have an immune system disorder. What are the signs or symptoms? Symptoms of this condition include: Ear pain. Fever. Decreased hearing. Tiredness (lethargy). Fluid leaking from the ear, if the eardrum is ruptured or has burst. Ringing in the ear. How is this diagnosed? This condition is diagnosed with a physical exam. During the exam, your health care provider will use an instrument called an otoscope to look in your ear and check for redness, swelling, and fluid. He or she will also ask about your symptoms. Your health care provider may also order tests, such as: A pneumatic otoscopy. This is a test to check the movement of the eardrum. It is done by squeezing a small amount of air into the ear. A tympanogram. This is a test that shows how well the eardrum moves in response to air pressure in the ear canal. It provides a graph for your health care provider to review. How is this treated? This condition can go away on its own within 3-5 days. But if the condition is caused by a bacterial infection and does not go away on its own, or if it keeps coming back, your health care provider may: Prescribe antibiotic medicine to treat the infection. Prescribe or recommend medicines to control pain. Follow these instructions at home: Take over-the-counter and prescription medicines only as told by your health care provider. If you were prescribed an antibiotic medicine, take it as told by your health care provider. Do not stop taking the antibiotic even if you start to feel better. Keep all follow-up visits. This is important. Contact a health care provider if: You have bleeding from your nose. There is a lump on your neck. You are not feeling better in 5  days. You feel worse instead of better. Get help right away if: You have severe pain that is not controlled with medicine. You have swelling, redness, or pain around your ear. You have stiffness in your neck. A part of your face is not moving (paralyzed). The bone behind your ear (mastoid bone) is tender when you touch it. You develop a severe headache. Summary Otitis media is redness, soreness, and swelling of the middle ear, usually resulting in pain and decreased hearing. This condition can go away on its own within 3-5 days. If the problem does not go away in 3-5 days, your health care provider may give you medicines to treat the infection. If you were prescribed an antibiotic medicine, take it as told by your health care provider. Follow all instructions that were given to you by your health care provider. This information is not intended to replace advice given to you by your health care provider. Make sure you discuss any questions you have with  your health care provider. Document Revised: 03/27/2021 Document Reviewed: 03/27/2021 Elsevier Patient Education  [place].

## 2021-11-16 ENCOUNTER — Telehealth: Payer: Self-pay

## 2021-11-16 ENCOUNTER — Encounter: Payer: Self-pay | Admitting: Adult Health

## 2021-11-16 ENCOUNTER — Other Ambulatory Visit: Payer: Self-pay | Admitting: Adult Health

## 2021-11-16 DIAGNOSIS — E559 Vitamin D deficiency, unspecified: Secondary | ICD-10-CM

## 2021-11-16 MED ORDER — VITAMIN D (ERGOCALCIFEROL) 1.25 MG (50000 UNIT) PO CAPS: 50000.0000 [IU] | ORAL_CAPSULE | ORAL | 0 refills | Status: DC

## 2021-11-16 NOTE — Telephone Encounter (Signed)
Messaged pt back to send picture of updated insurance card.

## 2021-11-16 NOTE — Telephone Encounter (Signed)
Labs ordered for vitamin d for future labs

## 2021-11-16 NOTE — Progress Notes (Signed)
All labs within normal limits except vitamin D is low, I will send prescription for this.  Also you can take your prednisone early in the morning all 6 tablets of prednisone in the morning time and decrease by one tablet each morning until gone.  Please add recheck vitamin D lab in 3 months and schedule

## 2021-11-16 NOTE — Telephone Encounter (Signed)
Placed call to pt to schedule lab appt . Lab ordered . LMTCB

## 2021-11-16 NOTE — Telephone Encounter (Signed)
Carolyn Bennett  P [facility] (supporting Flinchum, Eula Fried, FNP) 1 hour ago (12:23 PM)   Did you ever get a chance to send in the authorization for my ear drops     You routed conversation to Sherley Bounds, CMA 22 hours ago (2:38 PM)   Carolyn Bennett  P [facility] (supporting Flinchum, Eula Fried, FNP) 22 hours ago (2:36 PM)   The pharmacy sent a request for your authorization and also please make sure medicade will cover it. Thank you so much

## 2021-11-18 ENCOUNTER — Encounter: Payer: Self-pay | Admitting: Adult Health

## 2021-11-19 ENCOUNTER — Encounter: Payer: Self-pay | Admitting: Adult Health

## 2021-11-20 ENCOUNTER — Encounter: Payer: Self-pay | Admitting: Adult Health

## 2021-11-20 ENCOUNTER — Ambulatory Visit
Admission: RE | Admit: 2021-11-20 | Discharge: 2021-11-20 | Disposition: A | Discharge status: Home / Self Care | Payer: Medicaid Other | Source: Ambulatory Visit | Attending: Adult Health | Admitting: Adult Health

## 2021-11-20 ENCOUNTER — Other Ambulatory Visit: Payer: Self-pay | Admitting: Adult Health

## 2021-11-20 ENCOUNTER — Ambulatory Visit
Admission: RE | Admit: 2021-11-20 | Discharge: 2021-11-20 | Disposition: A | Discharge status: Home / Self Care | Payer: Medicaid Other | Attending: Adult Health | Admitting: Adult Health

## 2021-11-20 DIAGNOSIS — J101 Influenza due to other identified influenza virus with other respiratory manifestations: Secondary | ICD-10-CM

## 2021-11-20 DIAGNOSIS — R051 Acute cough: Secondary | ICD-10-CM | POA: Diagnosis not present

## 2021-11-20 DIAGNOSIS — R059 Cough, unspecified: Secondary | ICD-10-CM | POA: Diagnosis not present

## 2021-11-20 MED ORDER — FLUTICASONE-SALMETEROL 45-21 MCG/ACT IN AERO: 2.0000 | INHALATION_SPRAY | Freq: Two times a day (BID) | RESPIRATORY_TRACT | 0 refills | Status: DC

## 2021-11-20 NOTE — Progress Notes (Signed)
  Chest x ray is within normal limits. Try Advair inhaler as sent to pharmacy and albuterol inhaler as needed  Any new symptoms ?

## 2021-11-20 NOTE — Progress Notes (Signed)
Meds ordered this encounter  Medications   fluticasone-salmeterol (ADVAIR HFA) 45-21 MCG/ACT inhaler    Sig: Inhale 2 puffs into the lungs 2 (two) times daily. Rinse mouth after use with water    Dispense:  1 each    Refill:  0    Orders Placed This Encounter  Procedures   DG Chest 2 View    Order Specific Question:   Reason for Exam (SYMPTOM  OR DIAGNOSIS REQUIRED)    Answer:   flu 3 weeks ago persistent cough    Order Specific Question:   Is patient pregnant?    Answer:   No    Order Specific Question:   Preferred imaging location?    Answer:   [facility]

## 2021-11-22 ENCOUNTER — Telehealth: Payer: Self-pay

## 2021-11-22 NOTE — Telephone Encounter (Signed)
Received Prior authorization denial for pt medication Cipro HC OTIC suspension. Medication has been denied for the following reason: "because we did not see certain details about your use and treatment. We see that this request is for a drug called Cipro HC otic suspension for your use( acute serous otitis media, right ear). We may consider approval of this drug after a trial of certain other drugs first (a trial and failure of two formulary preferred drugs, such as Ciprodex Suspension, neomycin-polymyxin-hydrocortisone solution/suspension, ofloxacin drops)."

## 2021-11-27 NOTE — Telephone Encounter (Signed)
Noted  

## 2021-12-29 ENCOUNTER — Other Ambulatory Visit: Payer: Self-pay | Admitting: Nurse Practitioner

## 2021-12-29 DIAGNOSIS — J3489 Other specified disorders of nose and nasal sinuses: Secondary | ICD-10-CM

## 2022-01-02 ENCOUNTER — Encounter: Payer: Self-pay | Admitting: Adult Health

## 2022-01-02 ENCOUNTER — Other Ambulatory Visit: Payer: Self-pay | Admitting: Adult Health

## 2022-01-02 ENCOUNTER — Other Ambulatory Visit: Payer: Self-pay | Admitting: Family Medicine

## 2022-01-02 DIAGNOSIS — F418 Other specified anxiety disorders: Secondary | ICD-10-CM

## 2022-01-03 ENCOUNTER — Other Ambulatory Visit: Payer: Self-pay

## 2022-01-04 ENCOUNTER — Other Ambulatory Visit: Payer: Self-pay

## 2022-01-04 MED ORDER — LEVOTHYROXINE SODIUM 88 MCG PO TABS: 88.0000 ug | ORAL_TABLET | Freq: Every day | ORAL | 1 refills | Status: DC

## 2022-02-28 ENCOUNTER — Other Ambulatory Visit: Payer: Self-pay | Admitting: Adult Health

## 2022-02-28 DIAGNOSIS — F418 Other specified anxiety disorders: Secondary | ICD-10-CM

## 2022-03-06 ENCOUNTER — Other Ambulatory Visit: Payer: Self-pay | Admitting: Family Medicine

## 2022-03-06 DIAGNOSIS — F418 Other specified anxiety disorders: Secondary | ICD-10-CM

## 2022-03-07 MED ORDER — HYDROXYZINE HCL 50 MG PO TABS: 50.0000 mg | ORAL_TABLET | Freq: Three times a day (TID) | ORAL | 0 refills | Status: DC | PRN

## 2022-03-07 MED ORDER — HYDROXYZINE HCL 10 MG PO TABS: 10.0000 mg | ORAL_TABLET | Freq: Three times a day (TID) | ORAL | 0 refills | Status: DC | PRN

## 2022-03-24 ENCOUNTER — Encounter: Payer: Self-pay | Admitting: Adult Health

## 2022-08-04 ENCOUNTER — Other Ambulatory Visit: Payer: Self-pay | Admitting: Nurse Practitioner

## 2022-08-04 ENCOUNTER — Encounter: Payer: Self-pay | Admitting: Nurse Practitioner

## 2022-08-06 NOTE — Telephone Encounter (Signed)
[facility] Night - Client TELEPHONE ADVICE RECORD [facility] Patient Name: Carolyn Bennett Gender: Female DOB: February 03, 1991 Age: 31 Y 4 M 29 D Return Phone Number: [phone] (Primary) Address: City/ State/ Zip: [place]  [address] Client [facility] Night - Client Client Site [facility] - Night Provider Audria Nine- NP Contact Type Call Who Is Calling Patient / Member / Family / Caregiver Call Type Triage / Clinical Relationship To Patient Self Return Phone Number [phone] (Primary) Chief Complaint Prescription Refill or Medication Request (non symptomatic) Reason for Call Symptomatic / Request for Health Information Initial Comment Caller requesting a refill of her thyroid medication. Caller was a pt at [facility] but her provider is gone and she is to get an appt with NP [employer]. Translation No Nurse Assessment Nurse: White, RN, Adelina Mings Date/Time (Eastern Time): 08/04/2022 3:27:07 PM Confirm and document reason for call. If symptomatic, describe symptoms. ---Caller states she needs a refill on her levothyroxine. denies any current symptoms. Does the patient have any new or worsening symptoms? ---No Nurse: White, Charity fundraiser, Adelina Mings Date/Time (Eastern Time): 08/04/2022 3:29:30 PM Please select the assessment type ---Refill Does the patient have enough medication to last until the office opens? ---No Additional Documentation ---[facility] [phone]. was able to obtain a loaner dose until office opens Disp. Time Lamount Cohen Time) Disposition Final User 08/04/2022 3:43:09 PM Pharmacy Call Cliffton Asters, RN, Adelina Mings Reason: [facility] pharmacy to obtain loaner dose 08/04/2022 3:43:20 PM Clinical Call Yes Cliffton Asters RN, Adelina Mings Final Disposition 08/04/2022 3:43:20 PM Clinical Call Yes Cliffton Asters, RN, Adelina Mings PLEASE NOTE: All timestamps contained within this report are represented as [place] Standard  Time. CONFIDENTIALTY NOTICE: This fax transmission is intended only for the addressee. It contains information that is legally privileged, confidential or otherwise protected from use or disclosure. If you are not the intended recipient, you are strictly prohibited from reviewing, disclosing, copying using or disseminating any of this information or taking any action in reliance on or regarding this information. If you have received this fax in error, please notify us immediately by telephone so that we can arrange for its return to [place]. Phone: [phone], Toll-Free: [phone], Fax: [phone] Page: 2 of 2 Call Id: [id number] Comments User: Kandace Parkins, RN Date/Time Lamount Cohen Time): 08/04/2022 3:32:00 PM levothyroxine once every mornin

## 2022-08-06 NOTE — Telephone Encounter (Signed)
Please see note from [facility] CMA under access note; Joellen has already spoken with pt this morning and sent note to T Dugal FNP.

## 2022-08-06 NOTE — Telephone Encounter (Signed)
Patient called wanted to set up TOC from [facility] [place]. She ran out of thyroid medication Saturday. Wanted to know if she can do an office visit tomorrow to get labs and refill and set up TOC for later date.  Patient is off work Advertising account executive. If ok wanted [place] to leave voicemail with appointment time. She is getting ready to go into work to day and will not be able to answer phone for 10 hours.

## 2022-08-06 NOTE — Telephone Encounter (Signed)
Called left message at pt request with time and date of appointment.  No further action needed at this time.

## 2022-08-06 NOTE — Telephone Encounter (Signed)
This is Carolyn Bennett. Please put her in as a TOC visit for tomorrow. Might need override. I will send her ten days today so she doesn't run out

## 2022-08-07 ENCOUNTER — Ambulatory Visit: Payer: Medicaid Other | Admitting: Family

## 2022-08-07 ENCOUNTER — Encounter: Payer: Self-pay | Admitting: Family

## 2022-08-07 VITALS — BP 112/68 | HR 59 | Temp 98.6°F | Resp 16 | Ht 64.0 in | Wt 180.2 lb

## 2022-08-07 DIAGNOSIS — E039 Hypothyroidism, unspecified: Secondary | ICD-10-CM

## 2022-08-07 DIAGNOSIS — E559 Vitamin D deficiency, unspecified: Secondary | ICD-10-CM

## 2022-08-07 DIAGNOSIS — F419 Anxiety disorder, unspecified: Secondary | ICD-10-CM | POA: Diagnosis not present

## 2022-08-07 DIAGNOSIS — F32A Depression, unspecified: Secondary | ICD-10-CM

## 2022-08-07 DIAGNOSIS — Z6830 Body mass index (BMI) 30.0-30.9, adult: Secondary | ICD-10-CM

## 2022-08-07 DIAGNOSIS — Z3009 Encounter for other general counseling and advice on contraception: Secondary | ICD-10-CM | POA: Insufficient documentation

## 2022-08-07 DIAGNOSIS — Z13220 Encounter for screening for lipoid disorders: Secondary | ICD-10-CM | POA: Diagnosis not present

## 2022-08-07 DIAGNOSIS — K5900 Constipation, unspecified: Secondary | ICD-10-CM | POA: Diagnosis not present

## 2022-08-07 LAB — CBC WITH DIFFERENTIAL/PLATELET
Basophils Absolute: 0 10*3/uL (ref 0.0–0.1)
Basophils Relative: 0.6 % (ref 0.0–3.0)
Eosinophils Absolute: 0.1 10*3/uL (ref 0.0–0.7)
Eosinophils Relative: 1.9 % (ref 0.0–5.0)
HCT: 36.9 % (ref 36.0–46.0)
Hemoglobin: 12.4 g/dL (ref 12.0–15.0)
Lymphocytes Relative: 41.4 % (ref 12.0–46.0)
Lymphs Abs: 2.2 10*3/uL (ref 0.7–4.0)
MCHC: 33.7 g/dL (ref 30.0–36.0)
MCV: 85.8 fl (ref 78.0–100.0)
Monocytes Absolute: 0.3 10*3/uL (ref 0.1–1.0)
Monocytes Relative: 6.2 % (ref 3.0–12.0)
Neutro Abs: 2.7 10*3/uL (ref 1.4–7.7)
Neutrophils Relative %: 49.9 % (ref 43.0–77.0)
Platelets: 305 10*3/uL (ref 150.0–400.0)
RBC: 4.3 Mil/uL (ref 3.87–5.11)
RDW: 13.1 % (ref 11.5–15.5)
WBC: 5.3 10*3/uL (ref 4.0–10.5)

## 2022-08-07 LAB — COMPREHENSIVE METABOLIC PANEL
ALT: 11 U/L (ref 0–35)
AST: 10 U/L (ref 0–37)
Albumin: 4.6 g/dL (ref 3.5–5.2)
Alkaline Phosphatase: 56 U/L (ref 39–117)
BUN: 10 mg/dL (ref 6–23)
CO2: 26 mEq/L (ref 19–32)
Calcium: 9.2 mg/dL (ref 8.4–10.5)
Chloride: 105 mEq/L (ref 96–112)
Creatinine, Ser: 0.69 mg/dL (ref 0.40–1.20)
GFR: 114.83 mL/min (ref >60.00)
Glucose, Bld: 83 mg/dL (ref 70–99)
Potassium: 4.7 mEq/L (ref 3.5–5.1)
Sodium: 140 mEq/L (ref 135–145)
Total Bilirubin: 0.4 mg/dL (ref 0.2–1.2)
Total Protein: 7.1 g/dL (ref 6.0–8.3)

## 2022-08-07 LAB — LIPID PANEL
Cholesterol: 184 mg/dL (ref 0–200)
HDL: 30.4 mg/dL — ABNORMAL LOW (ref >39.00)
LDL Cholesterol: 138 mg/dL — ABNORMAL HIGH (ref 0–99)
NonHDL: 153.64
Total CHOL/HDL Ratio: 6
Triglycerides: 78 mg/dL (ref 0.0–149.0)
VLDL: 15.6 mg/dL (ref 0.0–40.0)

## 2022-08-07 LAB — VITAMIN D 25 HYDROXY (VIT D DEFICIENCY, FRACTURES): VITD: 29.91 ng/mL — ABNORMAL LOW (ref 30.00–100.00)

## 2022-08-07 LAB — TSH: TSH: 2.73 u[IU]/mL (ref 0.35–5.50)

## 2022-08-07 NOTE — Assessment & Plan Note (Signed)
tsh today  Goal 1-2 Continue levothyroxine 88 mcg pending lab results might increase

## 2022-08-07 NOTE — Assessment & Plan Note (Signed)
Lipid panel ordered pending results.   

## 2022-08-07 NOTE — Assessment & Plan Note (Signed)
Continue anxiety reducing techniques Reviewed phq9 and gad 7

## 2022-08-07 NOTE — Assessment & Plan Note (Signed)
Work on diet and exercise as tolerated  ?

## 2022-08-07 NOTE — Progress Notes (Signed)
Established Patient Office Visit  Subjective:  Patient ID: Carolyn Bennett, female    DOB: 02/18/1991  Age: 31 y.o. MRN: [id number]  CC:  Chief Complaint  Patient presents with   Transitions Of Care    HPI Carolyn Bennett is here for a transition of care visit.  Prior provider was: [name], FNP  Pt is without acute concerns.  Pap 02/26/20 , due 2024  TDAP 2018   chronic concerns:  Hypothyroid: levothyroxine 88 mcg once daily. Takes on an empty stomach in the am with water only. Doesn't eat anything for about one hour.   Has noticed increased constipation over the last few months, has had a diet change.   Eats extra protein which she thinks isn't helping .  Anxiety depression: stressors from family life in the past. Has contact with all of dads family. Works on Insurance account manager.   Past Medical History:  Diagnosis Date   Anxiety    Depression    Preeclampsia    Thyroid disease     Past Surgical History:  Procedure Laterality Date   CESAREAN SECTION N/A 05/22/2017   Procedure: STAT CESAREAN SECTION;  Surgeon: Benjaman Kindler, MD;  Location: [facility] ORS;  Service: Obstetrics;  Laterality: N/A;   VAGINAL SEPTOPLASTY  05/22/2017   Procedure: VAGINAL Laceration repair;  Surgeon: Benjaman Kindler, MD;  Location: [facility] ORS;  Service: Obstetrics;;    Family History  Problem Relation Age of Onset   Diabetes Father    Diabetes Paternal Grandfather    Melanoma Paternal Grandfather     Social History   Socioeconomic History   Marital status: Married    Spouse name: Not on file   Number of children: 2   Years of education: Not on file   Highest education level: Not on file  Occupational History   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  Tobacco Use   Smoking status: Former    Years: 5.00    Types: Cigarettes   Smokeless tobacco: Never  Vaping Use   Vaping Use: Former  Substance and Sexual Activity   Alcohol use: No   Drug use:  No   Sexual activity: Yes    Partners: Male    Birth control/protection: Coitus interruptus  Other Topics Concern   Not on file  Social History Narrative   Son 32    Daughter pria 5   Social Determinants of Health   Financial Resource Strain: Not on file  Food Insecurity: Not on file  Transportation Needs: Not on file  Physical Activity: Not on file  Stress: Not on file  Social Connections: Not on file  Intimate Partner Violence: Not on file    Outpatient Medications Prior to Visit  Medication Sig Dispense Refill   levothyroxine (SYNTHROID) 88 MCG tablet TAKE 1 TABLET(88 MCG) BY MOUTH DAILY 10 tablet 0   amoxicillin-clavulanate (AUGMENTIN) 875-125 MG tablet Take 1 tablet by mouth 2 (two) times daily. (Patient not taking: Reported on 08/07/2022) 20 tablet 0   ciprofloxacin-hydrocortisone (CIPRO HC OTIC) OTIC suspension Place 3 drops into the right ear 2 (two) times daily. (Patient not taking: Reported on 08/07/2022) 10 mL 0   fluticasone (FLONASE) 50 MCG/ACT nasal spray Place 2 sprays into both nostrils daily. (Patient not taking: Reported on 08/07/2022) 16 g 0   fluticasone-salmeterol (ADVAIR HFA) 45-21 MCG/ACT inhaler Inhale 2 puffs into the lungs 2 (two) times daily. Rinse mouth after use with water (Patient not taking: Reported on 08/07/2022) 1 each 0  hydrOXYzine (ATARAX) 10 MG tablet TAKE 1 TABLET BY MOUTH 3 TIMES A DAY AS NEEDED (MAY TAKE WITH THE 50 MG) 90 tablet 0   hydrOXYzine (ATARAX) 50 MG tablet TAKE 1 TABLET (50 MG TOTAL) BY MOUTH 3 (THREE) TIMES DAILY AS NEEDED (MAY CAUSE DROWSINESS). 90 tablet 0   hydrOXYzine (ATARAX) 50 MG tablet Take 1 tablet (50 mg total) by mouth 3 (three) times daily as needed (may cause drowsiness). (Patient not taking: Reported on 08/07/2022) 90 tablet 0   norethindrone (MICRONOR) 0.35 MG tablet Take 1 tablet by mouth daily. (Patient not taking: Reported on 11/14/2021)     predniSONE (STERAPRED UNI-PAK 21 TAB) 10 MG (21) TBPK tablet PO: Take 6 tablets on  day 1:Take 5 tablets day 2:Take 4 tablets day 3: Take 3 tablets day 4:Take 2 tablets day five: 5 Take 1 tablet day 6 (Patient not taking: Reported on 08/07/2022) 21 tablet 0   Vitamin D, Ergocalciferol, (DRISDOL) 1.25 MG (50000 UNIT) CAPS capsule Take 1 capsule (50,000 Units total) by mouth every 7 (seven) days. For total of 12 weeks. Take ONE tablet ONCE weekly by mouth. Recheck lab 1- 2 weeks after complete. (Patient not taking: Reported on 08/07/2022) 12 capsule 0   No facility-administered medications prior to visit.    No Known Allergies  ROS     Objective:    Physical Exam  Gen: NAD, resting comfortably CV: RRR with no murmurs appreciated Pulm: NWOB, CTAB with no crackles, wheezes, or rhonchi Skin: warm, dry Psych: Normal affect and thought content  BP 112/68   Pulse (!) 59   Temp 98.6 F (37 C)   Resp 16   Ht 5' 4"  (1.626 m)   Wt 180 lb 4 oz (81.8 kg)   LMP 08/06/2022 (Approximate)   SpO2 97%   BMI 30.94 kg/m  Wt Readings from Last 3 Encounters:  08/07/22 180 lb 4 oz (81.8 kg)  11/15/21 197 lb 9.6 oz (89.6 kg)  11/01/21 200 lb (90.7 kg)     Health Maintenance Due  Topic Date Due   Hepatitis C Screening  Never done   INFLUENZA VACCINE  07/31/2022    There are no preventive care reminders to display for this patient.  Lab Results  Component Value Date   TSH 3.80 11/15/2021   Lab Results  Component Value Date   WBC 10.2 11/15/2021   HGB 12.7 11/15/2021   HCT 37.5 11/15/2021   MCV 83.8 11/15/2021   PLT 328.0 11/15/2021   Lab Results  Component Value Date   NA 138 11/15/2021   K 4.6 11/15/2021   CO2 25 11/15/2021   GLUCOSE 93 11/15/2021   BUN 7 11/15/2021   CREATININE 0.67 11/15/2021   BILITOT 0.6 11/15/2021   ALKPHOS 85 11/15/2021   AST 16 11/15/2021   ALT 30 11/15/2021   PROT 7.5 11/15/2021   ALBUMIN 4.4 11/15/2021   CALCIUM 9.2 11/15/2021   ANIONGAP 10 01/26/2020   EGFR 101 04/14/2021   GFR 116.24 11/15/2021   Lab Results  Component  Value Date   CHOL 200 01/26/2020   Lab Results  Component Value Date   HDL 39 (L) 01/26/2020   Lab Results  Component Value Date   LDLCALC 147 (H) 01/26/2020   Lab Results  Component Value Date   TRIG 71 01/26/2020   Lab Results  Component Value Date   CHOLHDL 5.1 01/26/2020   Lab Results  Component Value Date   HGBA1C 5.3 04/14/2021  Assessment & Plan:   Problem List Items Addressed This Visit       Endocrine   Hypothyroidism    tsh today  Goal 1-2 Continue levothyroxine 88 mcg pending lab results might increase       Relevant Orders   TSH   Comprehensive metabolic panel   CBC with Differential/Platelet     Other   Anxiety and depression    Continue anxiety reducing techniques Reviewed phq9 and gad 7       Relevant Orders   Comprehensive metabolic panel   CBC with Differential/Platelet   Vitamin D deficiency    Ordered vitamin d pending results.        Relevant Orders   VITAMIN D 25 Hydroxy (Vit-D Deficiency, Fractures)   BMI 30.0-30.9,adult - Primary    Work on diet and exercise as tolerated      Relevant Orders   TSH   Comprehensive metabolic panel   CBC with Differential/Platelet   Constipation    Pt advised of the following:  Add fiber supplement once daily.  Add a probiotic (such as Florastor) daily. Drink 64 oz of water a day. Eat lots of fresh fruit and veggies. Ensure regular exercise.    If you are not able to have regular BM's with the above regimen, you may add miralax 1 tablespoon daily.  Increase or decrease amount/frequency as needed to ensure 1 soft BM/day.        Relevant Orders   Comprehensive metabolic panel   CBC with Differential/Platelet   Screening for lipoid disorders    Lipid panel ordered pending results.        Relevant Orders   Lipid panel    No orders of the defined types were placed in this encounter.   Follow-up: Return in about 6 months (around 02/07/2023) for regular follow up appt.     Eugenia Pancoast, FNP

## 2022-08-07 NOTE — Assessment & Plan Note (Addendum)
Pt advised of the following:  Add fiber supplement once daily.  Add a probiotic (such as Florastor) daily. Drink 64 oz of water a day. Eat lots of fresh fruit and veggies. Ensure regular exercise.    If you are not able to have regular BM's with the above regimen, you may add miralax 1 tablespoon daily.  Increase or decrease amount/frequency as needed to ensure 1 soft BM/day.    

## 2022-08-07 NOTE — Assessment & Plan Note (Signed)
Ordered vitamin d pending results.   

## 2022-08-07 NOTE — Patient Instructions (Addendum)
Welcome to our clinic, I am happy to have you as my new patient. I am excited to continue on this healthcare journey with you.  My goal for your TSH is between 1-2   Stop by the lab prior to leaving today. I will notify you of your results once received.   Add fiber supplement once daily.  Add a probiotic (such as Florastor) daily. Drink 64 oz of water a day. Eat lots of fresh fruit and veggies. Ensure regular exercise.    If you are not able to have regular BM's with the above regimen, you may add miralax 1 tablespoon daily.  Increase or decrease amount/frequency as needed to ensure 1 soft BM/day.   Please keep in mind Any my chart messages you send have up to a three business day turnaround for a response.  Phone calls may take up to a one full business day turnaround for a  response.   If you need a medication refill I recommend you request it through the pharmacy as this is easiest for [place] rather than sending a message and or phone call.   Due to recent changes in healthcare laws, you may see results of your imaging and/or laboratory studies on MyChart before I have had a chance to review them.  I understand that in some cases there may be results that are confusing or concerning to you. Please understand that not all results are received at the same time and often I may need to interpret multiple results in order to provide you with the best plan of care or course of treatment. Therefore, I ask that you please give me 2 business days to thoroughly review all your results before contacting my office for clarification. Should we see a critical lab result, you will be contacted sooner.   It was a pleasure seeing you today! Please do not hesitate to reach out with any questions and or concerns.  Regards,   Mort Sawyers FNP-C

## 2022-08-08 ENCOUNTER — Encounter: Payer: Self-pay | Admitting: Family

## 2022-08-09 ENCOUNTER — Encounter: Payer: Self-pay | Admitting: Family

## 2022-08-10 ENCOUNTER — Other Ambulatory Visit: Payer: Self-pay | Admitting: Family

## 2022-08-10 DIAGNOSIS — E039 Hypothyroidism, unspecified: Secondary | ICD-10-CM

## 2022-08-10 MED ORDER — LEVOTHYROXINE SODIUM 88 MCG PO TABS: ORAL_TABLET | ORAL | 0 refills | Status: DC

## 2022-08-10 NOTE — Progress Notes (Signed)
Have her schedule one month lab only visit for repeat thyroid levels.

## 2022-08-10 NOTE — Progress Notes (Signed)
Your vitamin D was a bit on the lower range. I will send an RX for vitamin D3 50,000 IU which you will take once weekly for 8 weeks. Once RX is complete, please continue over the counter Vitamin D3 1000 IU once daily. Return to the clinic for a follow up in three months to repeat your Vitamin D level.   Work on low cholesterol diet.   Going to increase levothyroxine as goal 1-2, very close but also may be symptomatic from visit.  Take levothyroxine 88 mcg M-Sat then take two tablets on Sunday. This will increase your daily average dose to 100 mcg

## 2022-08-19 ENCOUNTER — Encounter: Payer: Self-pay | Admitting: Family

## 2022-08-19 DIAGNOSIS — E559 Vitamin D deficiency, unspecified: Secondary | ICD-10-CM

## 2022-08-21 MED ORDER — VITAMIN D (ERGOCALCIFEROL) 1.25 MG (50000 UNIT) PO CAPS: 50000.0000 [IU] | ORAL_CAPSULE | ORAL | 0 refills | Status: AC

## 2022-10-04 ENCOUNTER — Other Ambulatory Visit: Payer: Self-pay

## 2022-10-04 DIAGNOSIS — E039 Hypothyroidism, unspecified: Secondary | ICD-10-CM

## 2022-10-04 NOTE — Telephone Encounter (Signed)
Please advise pt I am waiting for her one month repeat TSH level. Needs lab only appt

## 2022-10-05 MED ORDER — LEVOTHYROXINE SODIUM 88 MCG PO TABS: ORAL_TABLET | ORAL | 0 refills | Status: DC

## 2022-10-05 NOTE — Telephone Encounter (Signed)
Patient called back in and scheduled for lab only appointment.

## 2022-10-05 NOTE — Telephone Encounter (Signed)
Left message to return call to our office. Needs a lab only appointment.

## 2022-10-12 ENCOUNTER — Other Ambulatory Visit (INDEPENDENT_AMBULATORY_CARE_PROVIDER_SITE_OTHER): Payer: Medicaid Other

## 2022-10-12 ENCOUNTER — Other Ambulatory Visit: Payer: Self-pay | Admitting: Family

## 2022-10-12 DIAGNOSIS — E7800 Pure hypercholesterolemia, unspecified: Secondary | ICD-10-CM

## 2022-10-12 DIAGNOSIS — E039 Hypothyroidism, unspecified: Secondary | ICD-10-CM

## 2022-10-12 LAB — TSH: TSH: 0.97 u[IU]/mL (ref 0.35–5.50)

## 2022-10-12 LAB — LIPID PANEL
Cholesterol: 167 mg/dL (ref 0–200)
HDL: 31.2 mg/dL — ABNORMAL LOW (ref >39.00)
LDL Cholesterol: 120 mg/dL — ABNORMAL HIGH (ref 0–99)
NonHDL: 136.09
Total CHOL/HDL Ratio: 5
Triglycerides: 82 mg/dL (ref 0.0–149.0)
VLDL: 16.4 mg/dL (ref 0.0–40.0)

## 2022-10-22 ENCOUNTER — Encounter: Payer: Self-pay | Admitting: Family

## 2022-10-22 NOTE — Telephone Encounter (Signed)
Still unable to reach pt by phone. Will wait for pt to cb. Sending note to [place] triage.

## 2022-10-22 NOTE — Telephone Encounter (Signed)
Left v/m requesting pt to call [facility]. Will also send my chart note asking pt to call [facility].

## 2022-10-23 ENCOUNTER — Telehealth: Payer: Self-pay | Admitting: Family

## 2022-10-23 DIAGNOSIS — R300 Dysuria: Secondary | ICD-10-CM | POA: Diagnosis not present

## 2022-10-23 DIAGNOSIS — N3001 Acute cystitis with hematuria: Secondary | ICD-10-CM | POA: Diagnosis not present

## 2022-10-23 NOTE — Telephone Encounter (Signed)
Pt called in wants to droop off urine and do a virtual visit for a possible UTI unable to come in because of work . Would like a response in Mychart

## 2022-10-23 NOTE — Telephone Encounter (Signed)
Yes ma'am but it looks like from initial pt message pt wanted to have a urine cked only; have not been able to speak with pt yet. I left my chart note for pt that she would need appt for urinary symptoms and if needed appt after 5 PM should visit an urgent care.

## 2022-10-23 NOTE — Telephone Encounter (Signed)
Called pt and she could only come from 12-1. I did advise her that UC was a option, but she said why got to UC when she has a PCP.

## 2022-10-23 NOTE — Telephone Encounter (Signed)
[place] Night - Client Nonclinical Telephone Record  [facility] Client [place] Night - Client Client Site [place] - Night Contact Type Call Who Is Calling Patient / Member / Family / Caregiver Caller Name Stephania Macfarlane Phone Number [phone] Call Type Message Only Information Provided Reason for Call Returning a Call from the Office Initial [place] states that she missed a call from the office. Additional Comment Caller states that she was at work when she missed the call. Disp. Time Disposition Final User 10/22/2022 5:15:30 PM General Information Provided Yes Dorann Ou [place] Call Closed By: Joanna Hews Transaction Date/Time: 10/22/2022 5:13:48 PM (ET  See mychart notes above.

## 2022-10-23 NOTE — Telephone Encounter (Signed)
Looks like from messages prior we were trying ot contact her to schedule appt in office for urinary symptoms

## 2022-10-23 NOTE — Telephone Encounter (Signed)
Left message to return call to our office.  

## 2022-10-25 NOTE — Telephone Encounter (Signed)
Noted  

## 2022-10-29 NOTE — Telephone Encounter (Signed)
Looks like patient was seen at another office. She was treated and Lawerance Bach has been informed via my chart message with patient.  No further action needed at this time.

## 2023-01-13 ENCOUNTER — Encounter: Payer: Self-pay | Admitting: Family

## 2023-01-14 ENCOUNTER — Ambulatory Visit: Payer: Medicaid Other | Admitting: Family Medicine

## 2023-01-14 ENCOUNTER — Encounter: Payer: Self-pay | Admitting: Family Medicine

## 2023-01-14 ENCOUNTER — Ambulatory Visit (INDEPENDENT_AMBULATORY_CARE_PROVIDER_SITE_OTHER)
Admission: RE | Admit: 2023-01-14 | Discharge: 2023-01-14 | Disposition: A | Discharge status: Home / Self Care | Payer: Medicaid Other | Source: Ambulatory Visit | Attending: Family Medicine | Admitting: Family Medicine

## 2023-01-14 VITALS — BP 130/80 | HR 81 | Temp 98.4°F | Ht 64.0 in | Wt 160.1 lb

## 2023-01-14 DIAGNOSIS — R0781 Pleurodynia: Secondary | ICD-10-CM

## 2023-01-14 DIAGNOSIS — S060X0A Concussion without loss of consciousness, initial encounter: Secondary | ICD-10-CM | POA: Diagnosis not present

## 2023-01-14 MED ORDER — HYDROCODONE-ACETAMINOPHEN 5-325 MG PO TABS: 1.0000 | ORAL_TABLET | Freq: Four times a day (QID) | ORAL | 0 refills | Status: DC | PRN

## 2023-01-14 NOTE — Progress Notes (Unsigned)
    Erbie Arment T. Trellis Guirguis, MD, [place] at [facility] [place], [address]  Phone: [phone]  FAX: [phone]  Carolyn Bennett - 32 y.o. female  MRN [id number]  Date of Birth: 1991-11-03  Date: 01/14/2023  PCP: Eugenia Pancoast, FNP  Referral: Eugenia Pancoast, FNP  Chief Complaint  Patient presents with   Fall    Sunday morning-Sitting on Stool-Blacked out and Face planted -EMS was called-Refused ER-BP was really low   Pain in Rib Cage   Subjective:   Carolyn Bennett is a 32 y.o. very pleasant female patient with Body mass index is 30.94 kg/m. who presents with the following:  Face and head hurts, rib are really hurting.  Cannot bend over   Saturday night at 1:40 AM in the morning.   Orthostasis -   Review of Systems is noted in the HPI, as appropriate  Objective:   BP 130/80   Pulse 81   Temp 98.4 F (36.9 C) (Oral)   Ht 5\' 4"  (1.626 m)   LMP 12/31/2022   SpO2 95%   BMI 30.94 kg/m   GEN: No acute distress; alert,appropriate. PULM: Breathing comfortably in no respiratory distress PSYCH: Normally interactive.   Laboratory and Imaging Data:  Assessment and Plan:   ***

## 2023-01-15 ENCOUNTER — Telehealth: Payer: Self-pay | Admitting: Family

## 2023-01-15 ENCOUNTER — Encounter: Payer: Self-pay | Admitting: Family Medicine

## 2023-01-15 MED ORDER — OXYCODONE-ACETAMINOPHEN 5-325 MG PO TABS: 1.0000 | ORAL_TABLET | Freq: Four times a day (QID) | ORAL | 0 refills | Status: DC | PRN

## 2023-01-15 NOTE — Telephone Encounter (Signed)
I don't think the MyChart message was addressed.  Patient called the office on 01/14/23 at 8:18 am on 01/14/23 at was schedule to see Dr. Lorelei Pont at 11:00 am.

## 2023-01-15 NOTE — Telephone Encounter (Signed)
message was from 1/14 and I see it was addressed because pt was seen 1/15 with Dr. Lorelei Pont, but can we please add some more documentation about whom called pt when this was read, and what was done in response to the message? Thanks in advance.

## 2023-01-15 NOTE — Telephone Encounter (Signed)
Marlo notified as instructed by telephone.  Patient states understanding.

## 2023-01-15 NOTE — Telephone Encounter (Signed)
Can you call her back?  I sent her in some oxycodone/percocet.  This is going to be the strongest available pain medication.  Please be cautious with pain medicine, as she also does have a concussion.

## 2023-01-15 NOTE — Telephone Encounter (Signed)
Patient called in and stated that she seen Dr. Lorelei Pont on Monday 01/14/23 and was prescribed HYDROcodone-acetaminophen (NORCO/VICODIN) 5-325 MG tablet. She stated that it isn't helping much and was wondering if something stronger could be sent in for her. She stated she isn't sleeping much and the pain is unbearable. Please advise. Thank you!

## 2023-01-16 NOTE — Telephone Encounter (Signed)
Please assess

## 2023-01-20 NOTE — Progress Notes (Signed)
Carolyn Bennett T. Carolyn Lashway, MD, [place] at [facility] [place], [address]  Phone: [phone]  FAX: [phone]  Carolyn Bennett - 32 y.o. female  MRN [id number]  Date of Birth: [birth year]  Date: 01/21/2023  PCP: Carolyn Pancoast, FNP  Referral: Carolyn Pancoast, FNP  Chief Complaint  Patient presents with   Concussion   Rib Injury   Subjective:   Carolyn Bennett is a 32 y.o. very pleasant female patient with Body mass index is 27.81 kg/m. who presents with the following:  Patient is here is with severe rib pain recalcitrant to pain and requiring opioids for pain management.  She has required Percocet for pain, and she was not under control with Norco alone.  At that time she also was having an acute concussion after her fall from a barstool.  The prior history is detailed below, but she is here today for concussion follow-up.  More hurting in the ribs.  This is globally doing better compared to when I saw her last week.  Head feels a lot better.  She feels a lot more clear, less foggy, and overall much less symptoms compared to 1 week ago.  She still endorses level 1 mild nausea, dizziness, feeling slowed, fatigue, drowsiness, emotional, irritable, sadness, and anxiety.  At baseline she does have some underlying anxiety, which is a longstanding issue.  Total number of symptoms: 9/22 Symptom severity score: 9/132  Orientation: 5/5 Immediate memory: 15/15, 5 words Concentration, digits backwards: 3/4 Months in reverse order: 1/1  Pupils equal active reactive to light and accommodation.  Extraocular movements are intact. Cranial nerves II through XII are intact Sensation is intact throughout. Neurovascularly intact throughout Near point convergence is normal. VOMS is normal.  01/14/2023 Last OV with Owens Loffler, MD  The patient reports that on Saturday night at 1:40 AM she became lightheaded and fell off of a  barstool.  She struck her face and has some fogginess in and about the time.  EMS was called's, she was found to have very low blood pressure.  At that time she did not choose to go to the emergency room.   Right now she complains of significant pain on the right side of her rib cage.  She is having pain with moving and with deep breathing.  There is no bruising associated.  Pain is lateral on the right side.  She does not describe any sternal pain she has no pain on the left.  She has no abdominal pain.   Towards the end of the interview, she remarks that she did feel a little bit foggy, as well, so I initiated a scat 5 concussion questionnaire.   Total number of symptoms: 20/22 Symptom severity score: 61/132   She endorses severe irritability, sadness, increased emotional, as well as trouble falling asleep.   She endorses moderate headache, pressure in the head, dizziness, does not feel right, fatigue, confusion, and anxiety.   She endorses mild nausea, balance problems, photophobia, phonophobia, feeling slowed down, feeling like a fog, difficulty remembering, difficulty concentrating, and confusion.   Cognitive screening: Orientation 5/5   Immediate memory: 15/15   Concentration: Digits: 3/4 Months in reverse order: 1/1 Concentration total score: 4/5   mBess testing is normal She does have pain with terminal neck movements Near point convergence is normal  Review of Systems is noted in the HPI, as appropriate  Objective:   BP 110/70   Pulse 76  Temp 98.6 F (37 C) (Oral)   Ht 5\' 4"  (1.626 m)   Wt 162 lb (73.5 kg)   LMP 12/31/2022 (Approximate)   SpO2 98%   BMI 27.81 kg/m   GEN: No acute distress; alert,appropriate. PULM: Breathing comfortably in no respiratory distress PSYCH: Normally interactive.   Additional neurology exam as above.  MBess testing is normal  Laboratory and Imaging Data:  Assessment and Plan:     ICD-10-CM   1. Concussion without loss of  consciousness, subsequent encounter  S06.0X0D     2. Rib pain on right side  R07.81      Rib pain is improving and I would expect it will continue to improve slowly over the course of the next 2 to 3 weeks.  She is certainly doing much better in terms of her concussion.  Symptoms are fairly dramatically improved, if not 100% resolved.  I still cautioned her with mental and physical rest.  Think is reasonable for her to return to work on a limited basis.  I recommended that she go to work half days only for the remainder of this week, and take regular breaks.  She feels overwhelmed at all, then take a quiet break for 10 to 15 minutes and a quiet room or dark room.  She is going to call me if this is too much, and we will extend her absence from work for an additional week.  I think that this is a reasonable accommodation.  Social: Right now she is having some limitation in her ability to fully work, and physical limitations from doing any significant exercise.  Medication Management during today's office visit: No orders of the defined types were placed in this encounter.  Medications Discontinued During This Encounter  Medication Reason   HYDROcodone-acetaminophen (NORCO/VICODIN) 5-325 MG tablet Change in therapy    Orders placed today for conditions managed today: No orders of the defined types were placed in this encounter.   Disposition: No follow-ups on file.  [employer] speech-to-text software was used for transcription in this dictation.  Possible transcriptional errors can occur using Editor, commissioning.   Signed,  Maud Deed. Cyla Haluska, MD   Outpatient Encounter Medications as of 01/21/2023  Medication Sig   levothyroxine (SYNTHROID) 88 MCG tablet Take one tablet po qd M-Sat, take two tablets on Sunday and repeat weekly   oxyCODONE-acetaminophen (PERCOCET/ROXICET) 5-325 MG tablet Take 1 tablet by mouth every 6 (six) hours as needed for moderate pain or severe pain.    [DISCONTINUED] HYDROcodone-acetaminophen (NORCO/VICODIN) 5-325 MG tablet Take 1 tablet by mouth every 6 (six) hours as needed for moderate pain or severe pain.   No facility-administered encounter medications on file as of 01/21/2023.

## 2023-01-21 ENCOUNTER — Ambulatory Visit: Payer: Medicaid Other | Admitting: Family Medicine

## 2023-01-21 ENCOUNTER — Encounter: Payer: Self-pay | Admitting: Family Medicine

## 2023-01-21 VITALS — BP 110/70 | HR 76 | Temp 98.6°F | Ht 64.0 in | Wt 162.0 lb

## 2023-01-21 DIAGNOSIS — S060X0D Concussion without loss of consciousness, subsequent encounter: Secondary | ICD-10-CM

## 2023-01-21 DIAGNOSIS — R0781 Pleurodynia: Secondary | ICD-10-CM | POA: Diagnosis not present

## 2023-01-22 ENCOUNTER — Encounter: Payer: Self-pay | Admitting: Family Medicine

## 2023-01-28 ENCOUNTER — Telehealth: Payer: Self-pay

## 2023-01-28 NOTE — Telephone Encounter (Signed)
Patient's copy placed in outgoing mail per request. 

## 2023-01-28 NOTE — Telephone Encounter (Signed)
Patient brought in an FMLA form today for when she was out on 01/14/23-01/21/2023 and for reduced hours after that timeframe.  This is due tomorrow, 01/29/23.  Form placed in your inbox for completion and signing.

## 2023-01-28 NOTE — Telephone Encounter (Signed)
Done

## 2023-01-28 NOTE — Telephone Encounter (Signed)
Patient came by and dropped off paper work. Placed in [employer].

## 2023-01-28 NOTE — Telephone Encounter (Signed)
Completed form has been faxed to fax# [phone].  Received fax confirmation.  Copies made for patient, scanning, and myself.  Sent note to patient via mychart to see how she would like to receive her copy.

## 2023-02-11 ENCOUNTER — Other Ambulatory Visit: Payer: Self-pay

## 2023-02-11 ENCOUNTER — Other Ambulatory Visit: Payer: Self-pay | Admitting: Family

## 2023-02-11 DIAGNOSIS — E039 Hypothyroidism, unspecified: Secondary | ICD-10-CM

## 2023-02-11 MED ORDER — LEVOTHYROXINE SODIUM 88 MCG PO TABS: ORAL_TABLET | ORAL | 0 refills | Status: DC

## 2023-02-13 ENCOUNTER — Telehealth: Payer: Self-pay | Admitting: Family

## 2023-02-13 MED ORDER — LEVOTHYROXINE SODIUM 88 MCG PO TABS: ORAL_TABLET | ORAL | 0 refills | Status: DC

## 2023-02-13 NOTE — Telephone Encounter (Signed)
Completed form faxed to fax# [phone].  Received fax confirmation.  Copies made for patient, scanning, and myself.  Patient's copy placed in outgoing mail.  Sent msg via [employer].

## 2023-02-13 NOTE — Telephone Encounter (Signed)
Type of forms received: FMLA   Routed to: [employer] received by : Armandina Stammer   Individual made aware of 3-5 business day turn around (Y/N): Y  Form completed and patient made aware of charges(Y/N): Y   Faxed to :   Form location:  kate causey's folder

## 2023-02-13 NOTE — Telephone Encounter (Signed)
Done

## 2023-02-13 NOTE — Telephone Encounter (Signed)
We received an additional FMLA form for patient.  Her continuous leave was approved, but the reduced schedule was denied because it was on the same form.  They need a separate form filled out for the reduced schedule.  This form has been placed in your inbox for signing.

## 2023-02-26 NOTE — Telephone Encounter (Signed)
Patient sent in mychart note that her claim was denied due to incomplete form being sent in.  Section C, #1 was not complete and there was no provider signature on form.  Both of these were complete.  I called [employer] at 11:54 today and spoke to [place].  She said the computer kicked the form out as incomplete, but when she looked back at the form, it is complete.  She passed the info along to her Emergency planning/management officer, Sharyn Lull.  She asked me to call back in two hours to see if it had been changed to complete.  I called back and spoke to [place], who said it still says incomplete status.  He sent another message to Sharyn Lull to have her check the form and call me back with updates when it has been marked as complete.

## 2023-02-27 NOTE — Telephone Encounter (Signed)
I spoke to [place] at [facility].  She got in touch with Sharyn Lull, Emergency planning/management officer, who said we need to add that the reduced hours is for 5 days a week.  This has been added to the form, and Dr Lorelei Pont signed and dated the change.  The updated form has been faxed to fax# [phone].  Received fax confirmation.  Sent patient mychart msg to notify her this has been done.

## 2023-03-13 ENCOUNTER — Encounter: Payer: Self-pay | Admitting: Family Medicine

## 2023-03-13 ENCOUNTER — Ambulatory Visit: Payer: Medicaid Other | Admitting: Family Medicine

## 2023-03-13 VITALS — BP 136/86 | HR 76 | Temp 98.6°F | Ht 64.0 in | Wt 151.0 lb

## 2023-03-13 DIAGNOSIS — Z3009 Encounter for other general counseling and advice on contraception: Secondary | ICD-10-CM

## 2023-03-13 DIAGNOSIS — Z9189 Other specified personal risk factors, not elsewhere classified: Secondary | ICD-10-CM

## 2023-03-13 DIAGNOSIS — N898 Other specified noninflammatory disorders of vagina: Secondary | ICD-10-CM | POA: Diagnosis not present

## 2023-03-13 DIAGNOSIS — K644 Residual hemorrhoidal skin tags: Secondary | ICD-10-CM

## 2023-03-13 NOTE — Assessment & Plan Note (Signed)
Acute, new likely physiologic vaginal discharge.  Will send wet prep for evaluation.  GC chlamydia probe tested.

## 2023-03-13 NOTE — Assessment & Plan Note (Signed)
Recommended using condoms to protect from STDs.  Will move forward with STD screening.

## 2023-03-13 NOTE — Assessment & Plan Note (Addendum)
Chronic, recommended working on keeping up with fiber and water.  Avoid constipation.  Treat with topical over-the-counter hydrocortisone/ pramoxine cream x 5 days.

## 2023-03-13 NOTE — Progress Notes (Signed)
Patient ID: Carolyn Bennett, female    DOB: 11/12/1991, 32 y.o.   MRN: [id number]  This visit was conducted in person.  BP 136/86   Pulse 76   Temp 98.6 F (37 C) (Temporal)   Ht '5\' 4"'$  (1.626 m)   Wt 151 lb (68.5 kg)   LMP 03/06/2023 (Exact Date)   SpO2 100%   BMI 25.92 kg/m    CC:  Chief Complaint  Patient presents with   Vaginal Discomfort    Patient states she recently changed partners and has had unprotected sex on multiple occasions. Saturday she started to feel some "vaginal discomfort", she reports no vaginal itching, slight occasional tingling in that area, slight burning with urination, some clear discharge. She overall feels fine, with not many notable symptoms she would just like to be on the safe side and be tested.   She would like to discuss going back on birth control in light of her new partner.     Subjective:   HPI: Carolyn Bennett is a 32 y.o. female presenting on 03/13/2023 for Vaginal Discomfort (Patient states she recently changed partners and has had unprotected sex on multiple occasions. Saturday she started to feel some "vaginal discomfort", she reports no vaginal itching, slight occasional tingling in that area, slight burning with urination, some clear discharge. She overall feels fine, with not many notable symptoms she would just like to be on the safe side and be tested. //She would like to discuss going back on birth control in light of her new partner. )   Exposure to new partners, unprotected x 1 month.  Pain in vaginal area after intercourse, tingling inside vaginal canal,  no focal area She has anxiety that may be acting up. Slight burning when urine touches  Clear discharge.   No fever, no abdominal pain.  Interested in restarting birth control given new partner... was on Dep in past doing well. Currently ending of menses   HX of hemorrhoids after child birth.. now recently sore.  No  constipation. No bleeding.   Pap smear 2021.. negative  HPV and  pap.     Relevant past medical, surgical, family and social history reviewed and updated as indicated. Interim medical history since our last visit reviewed. Allergies and medications reviewed and updated. Outpatient Medications Prior to Visit  Medication Sig Dispense Refill   levothyroxine (SYNTHROID) 88 MCG tablet Take one tablet po qd M-Sat, take two tablets on Sunday and repeat weekly 96 tablet 0   oxyCODONE-acetaminophen (PERCOCET/ROXICET) 5-325 MG tablet Take 1 tablet by mouth every 6 (six) hours as needed for moderate pain or severe pain. (Patient not taking: Reported on 03/13/2023) 20 tablet 0   No facility-administered medications prior to visit.     Per HPI unless specifically indicated in ROS section below Review of Systems  Constitutional:  Negative for fatigue and fever.  HENT:  Negative for congestion.   Eyes:  Negative for pain.  Respiratory:  Negative for cough and shortness of breath.   Cardiovascular:  Negative for chest pain, palpitations and leg swelling.  Gastrointestinal:  Negative for abdominal pain.  Genitourinary:  Positive for vaginal discharge and vaginal pain. Negative for dysuria and vaginal bleeding.  Musculoskeletal:  Negative for back pain.  Neurological:  Negative for syncope, light-headedness and headaches.  Psychiatric/Behavioral:  Negative for dysphoric mood.    Objective:  BP 136/86   Pulse 76   Temp 98.6 F (37 C) (Temporal)   Ht '5\' 4"'$  (  1.626 m)   Wt 151 lb (68.5 kg)   LMP 03/06/2023 (Exact Date)   SpO2 100%   BMI 25.92 kg/m   Wt Readings from Last 3 Encounters:  03/13/23 151 lb (68.5 kg)  01/21/23 162 lb (73.5 kg)  01/14/23 160 lb 2 oz (72.6 kg)      Physical Exam Exam conducted with a chaperone present.  Constitutional:      General: She is not in acute distress.    Appearance: Normal appearance. She is well-developed. She is not ill-appearing or toxic-appearing.  HENT:     Head: Normocephalic.     Right Ear: Hearing,  tympanic membrane, ear canal and external ear normal. Tympanic membrane is not erythematous, retracted or bulging.     Left Ear: Hearing, tympanic membrane, ear canal and external ear normal. Tympanic membrane is not erythematous, retracted or bulging.     Nose: No mucosal edema or rhinorrhea.     Right Sinus: No maxillary sinus tenderness or frontal sinus tenderness.     Left Sinus: No maxillary sinus tenderness or frontal sinus tenderness.     Mouth/Throat:     Pharynx: Uvula midline.  Eyes:     General: Lids are normal. Lids are everted, no foreign bodies appreciated.     Conjunctiva/sclera: Conjunctivae normal.     Pupils: Pupils are equal, round, and reactive to light.  Neck:     Thyroid: No thyroid mass or thyromegaly.     Vascular: No carotid bruit.     Trachea: Trachea normal.  Cardiovascular:     Rate and Rhythm: Normal rate and regular rhythm.     Pulses: Normal pulses.     Heart sounds: Normal heart sounds, S1 normal and S2 normal. No murmur heard.    No friction rub. No gallop.  Pulmonary:     Effort: Pulmonary effort is normal. No tachypnea or respiratory distress.     Breath sounds: Normal breath sounds. No decreased breath sounds, wheezing, rhonchi or rales.  Abdominal:     General: Bowel sounds are normal.     Palpations: Abdomen is soft.     Tenderness: There is no abdominal tenderness.     Hernia: There is no hernia in the left inguinal area or right inguinal area.  Genitourinary:    General: Normal vulva.     Exam position: Lithotomy position.     Pubic Area: No rash.      Labia:        Right: No rash, tenderness or lesion.        Left: No rash, tenderness or lesion.      Urethra: No prolapse or urethral pain.     Vagina: Normal.     Cervix: No discharge or friability.     Uterus: Normal. Not tender.      Adnexa: Right adnexa normal and left adnexa normal.     Rectum: External hemorrhoid present. No mass.     Comments: Razor bumps secondary to shaving mons  pubis and groin Musculoskeletal:     Cervical back: Normal range of motion and neck supple.  Lymphadenopathy:     Lower Body: No right inguinal adenopathy. No left inguinal adenopathy.  Skin:    General: Skin is warm and dry.     Findings: No rash.  Neurological:     Mental Status: She is alert.  Psychiatric:        Mood and Affect: Mood is not anxious or depressed.  Speech: Speech normal.        Behavior: Behavior normal. Behavior is cooperative.        Thought Content: Thought content normal.        Judgment: Judgment normal.       Results for orders placed or performed in visit on 10/12/22  Lipid panel  Result Value Ref Range   Cholesterol 167 0 - 200 mg/dL   Triglycerides 82.0 0.0 - 149.0 mg/dL   HDL 31.20 (L) >39.00 mg/dL   VLDL 16.4 0.0 - 40.0 mg/dL   LDL Cholesterol 120 (H) 0 - 99 mg/dL   Total CHOL/HDL Ratio 5    NonHDL 136.09     Assessment and Plan  At risk for sexually transmitted disease due to unprotected sex Assessment & Plan: Recommended using condoms to protect from STDs.  Will move forward with STD screening.  Orders: -     Herpes Simplex Virus 1 and 2 (IgG), with Reflex to HSV-2 Inhibition -     HIV Antibody (routine testing w rflx) -     RPR -     Hepatitis panel, acute -     C. trachomatis/N. gonorrhoeae RNA  Vaginal discharge Assessment & Plan: Acute, new likely physiologic vaginal discharge.  Will send wet prep for evaluation.  GC chlamydia probe tested.  Orders: -     WET PREP BY MOLECULAR PROBE -     C. trachomatis/N. gonorrhoeae RNA  External hemorrhoids Assessment & Plan: Chronic, recommended working on keeping up with fiber and water.  Avoid constipation.  Treat with topical over-the-counter hydrocortisone/ pramoxine cream x 5 days.   Birth control counseling Assessment & Plan: Discussed options in detail.  She is interested in restarting Depo-Provera as she did well on this in the past.  She is currently finishing up her  menstrual cycle today.  She will return in 2 weeks for repeat urine pregnancy test.  If negative she will be started that day on Depo-Provera     Return in about 2 weeks (around 03/27/2023) for  nurse visit for Depo inititation, do preg test, if neg proceed.   Eliezer Lofts, MD

## 2023-03-13 NOTE — Assessment & Plan Note (Signed)
Discussed options in detail.  She is interested in restarting Depo-Provera as she did well on this in the past.  She is currently finishing up her menstrual cycle today.  She will return in 2 weeks for repeat urine pregnancy test.  If negative she will be started that day on Depo-Provera

## 2023-03-13 NOTE — Patient Instructions (Signed)
Treat external hemorrhoids with toical OTC hydrocortisone/pramoxine combo by prparation H.. daily x 5 days for flares.  Use fiber and water to prevent constipaiton.  We will call with lab and swab resutls.

## 2023-03-14 ENCOUNTER — Telehealth: Payer: Self-pay | Admitting: Family

## 2023-03-14 ENCOUNTER — Encounter: Payer: Self-pay | Admitting: Family Medicine

## 2023-03-14 ENCOUNTER — Other Ambulatory Visit: Payer: Self-pay | Admitting: Family Medicine

## 2023-03-14 MED ORDER — METRONIDAZOLE 500 MG PO TABS: 500.0000 mg | ORAL_TABLET | Freq: Two times a day (BID) | ORAL | 0 refills | Status: DC

## 2023-03-14 NOTE — Telephone Encounter (Signed)
Patient would like a phone call to go over her lab results. Thank you!

## 2023-03-14 NOTE — Telephone Encounter (Signed)
Left message for Carolyn Bennett to return call in regards to her lab results or she can review results and Dr. Rometta Emery comments on her 55.

## 2023-03-14 NOTE — Telephone Encounter (Signed)
Patient reviewed lab results on her MyChart and sent message:  Oral medication.  Understands no alcohol.  [facility].

## 2023-03-14 NOTE — Telephone Encounter (Signed)
Patient called office and was asking about treatment and how is was prescribed and can she have intercourse while on this medications.   Okay to send this information on patient's MyChart.

## 2023-03-14 NOTE — Telephone Encounter (Signed)
Pl;ease call patient.. I just sent Mychart comments.. most of testing is still pending. Let me know if she has further questions.

## 2023-03-15 LAB — C. TRACHOMATIS/N. GONORRHOEAE RNA
C. trachomatis RNA, TMA: NOT DETECTED
N. gonorrhoeae RNA, TMA: NOT DETECTED

## 2023-03-21 LAB — HEPATITIS PANEL, ACUTE
Hep A IgM: NONREACTIVE
Hep B C IgM: NONREACTIVE
Hepatitis B Surface Ag: NONREACTIVE
Hepatitis C Ab: NONREACTIVE

## 2023-03-21 LAB — HIV ANTIBODY (ROUTINE TESTING W REFLEX): HIV 1&2 Ab, 4th Generation: NONREACTIVE

## 2023-03-21 LAB — WET PREP BY MOLECULAR PROBE
Candida species: NOT DETECTED
MICRO NUMBER:: 14687358
SPECIMEN QUALITY:: ADEQUATE
Trichomonas vaginosis: NOT DETECTED

## 2023-03-21 LAB — HERPES SIMPLEX VIRUS 1 AND 2 (IGG),REFLEX HSV-2 INHIBITION
HAV 1 IGG,TYPE SPECIFIC AB: <0.9 index
HSV 2 IGG,TYPE SPECIFIC AB: 9.2 index — ABNORMAL HIGH

## 2023-03-21 LAB — HSV 2 INHIBITION: HSV 2 IGG INHIBITION,IA: POSITIVE — AB

## 2023-03-21 LAB — RPR: RPR Ser Ql: NONREACTIVE

## 2023-03-24 ENCOUNTER — Encounter: Payer: Self-pay | Admitting: Family Medicine

## 2023-03-27 ENCOUNTER — Ambulatory Visit: Payer: Medicaid Other

## 2023-03-27 ENCOUNTER — Ambulatory Visit: Payer: Medicaid Other | Admitting: Internal Medicine

## 2023-03-27 VITALS — BP 122/80 | HR 82 | Temp 98.3°F | Ht 64.0 in | Wt 152.0 lb

## 2023-03-27 DIAGNOSIS — R8290 Unspecified abnormal findings in urine: Secondary | ICD-10-CM | POA: Diagnosis not present

## 2023-03-27 DIAGNOSIS — N898 Other specified noninflammatory disorders of vagina: Secondary | ICD-10-CM

## 2023-03-27 DIAGNOSIS — Z3042 Encounter for surveillance of injectable contraceptive: Secondary | ICD-10-CM | POA: Diagnosis not present

## 2023-03-27 DIAGNOSIS — N760 Acute vaginitis: Secondary | ICD-10-CM | POA: Insufficient documentation

## 2023-03-27 DIAGNOSIS — B9689 Other specified bacterial agents as the cause of diseases classified elsewhere: Secondary | ICD-10-CM | POA: Diagnosis not present

## 2023-03-27 LAB — POC URINALSYSI DIPSTICK (AUTOMATED)
Bilirubin, UA: NEGATIVE
Blood, UA: NEGATIVE
Glucose, UA: NEGATIVE
Ketones, UA: NEGATIVE
Leukocytes, UA: NEGATIVE
Nitrite, UA: NEGATIVE
Protein, UA: NEGATIVE
Spec Grav, UA: 1.01 (ref 1.010–1.025)
Urobilinogen, UA: 0.2 E.U./dL
pH, UA: 6 (ref 5.0–8.0)

## 2023-03-27 LAB — POCT URINE PREGNANCY: Preg Test, Ur: NEGATIVE

## 2023-03-27 MED ORDER — MEDROXYPROGESTERONE ACETATE 150 MG/ML IM SUSP
150.0000 mg | INTRAMUSCULAR | Status: DC
  Administered 2023-03-27: 150 mg via INTRAMUSCULAR

## 2023-03-27 NOTE — Telephone Encounter (Signed)
Please see 03/24/23 pt message thru my chart; pt said finished abx for bacterial vaginosis on 03/21/23.pt still has vaginal discharge with vaginal and perineal itching. Pt has urinary "sensation" when urinates with more frequency of urine than usual. No fever, no abd or back pain.  Since pt needs appt with provider did not do pregnancy test nor give Depo provera shot and cancelled nurse visit. Pt scheduled first available appt today with Dr Silvio Pate at 11:45 AM. Sending note to  Dr Silvio Pate and Silvio Pate pool.

## 2023-03-27 NOTE — Progress Notes (Signed)
Per orders of Dr. Silvio Pate, injection of Depo-Provera given by Pilar Grammes, CMA in [place]. Patient tolerated injection well.   Next injection due June 12 to June 26. This information was given to the pt.

## 2023-03-27 NOTE — Assessment & Plan Note (Signed)
Better but still slight discharge If still positive, will try the metronidazole again (she prefers no intra-vaginal Rx)

## 2023-03-27 NOTE — Progress Notes (Signed)
Subjective:    Patient ID: Carolyn Bennett, female    DOB: December 08, 1991, 32 y.o.   MRN: [id number]  HPI Here due to concerns about her urine  Planning to start depo today  Just finished metronidazole No side effects Itching is better now---slight discharge now  She saw some "stuff" in the urine floating when getting the pregnancy test this morning No dysuria or hematuria  Has single partner now  Current Outpatient Medications on Bennett Prior to Visit  Medication Sig Dispense Refill   levothyroxine (SYNTHROID) 88 MCG tablet Take one tablet po qd M-Sat, take two tablets on Sunday and repeat weekly 96 tablet 0   No current facility-administered medications on Bennett prior to visit.    No Known Allergies  Past Medical History:  Diagnosis Date   Anxiety    Depression    Preeclampsia    Thyroid disease     Past Surgical History:  Procedure Laterality Date   CESAREAN SECTION N/A 05/22/2017   Procedure: STAT CESAREAN SECTION;  Surgeon: Benjaman Kindler, MD;  Location: [facility] ORS;  Service: Obstetrics;  Laterality: N/A;   VAGINAL SEPTOPLASTY  05/22/2017   Procedure: VAGINAL Laceration repair;  Surgeon: Benjaman Kindler, MD;  Location: [facility] ORS;  Service: Obstetrics;;    Family History  Problem Relation Age of Onset   Diabetes Father    Diabetes Paternal Grandfather    Melanoma Paternal Grandfather     Social History   Socioeconomic History   Marital status: Married    Spouse name: Carolyn Bennett   Number of children: 2   Years of education: Carolyn Bennett   Highest education level: Associate degree: academic program  Occupational History   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  Tobacco Use   Smoking status: Former    Years: 5    Types: Cigarettes   Smokeless tobacco: Never  Vaping Use   Vaping Use: Former  Substance and Sexual Activity   Alcohol use: No   Drug use: No   Sexual activity: Yes    Partners: Male    Birth control/protection: Coitus  interruptus  Other Topics Concern   Carolyn Bennett  Social History Narrative   Son 31    Daughter pria 5   Social Determinants of Health   Financial Resource Strain: Low Risk  (03/27/2023)   Overall Financial Resource Strain (CARDIA)    Difficulty of Paying Living Expenses: Carolyn very hard  Food Insecurity: No Food Insecurity (03/27/2023)   Hunger Vital Sign    Worried About Running Out of Food in the Last Year: Never true    Ran Out of Food in the Last Year: Never true  Transportation Needs: No Transportation Needs (03/27/2023)   PRAPARE - Hydrologist (Medical): No    Lack of Transportation (Non-Medical): No  Physical Activity: Sufficiently Active (03/27/2023)   Exercise Vital Sign    Days of Exercise per Week: 4 days    Minutes of Exercise per Session: 60 min  Stress: No Stress Concern Present (03/27/2023)   [place]    Feeling of Stress : Only a little  Social Connections: Unknown (03/27/2023)   Social Connection and Isolation Panel [NHANES]    Frequency of Communication with Friends and Family: More than three times a week    Frequency of Social Gatherings with Friends and Family: More than three times a week    Attends Religious Services: Patient  declined    Active Member of Clubs or Organizations: No    Attends Music therapist: Not on Bennett    Marital Status: Separated  Intimate Partner Violence: Carolyn Bennett   Review of Systems No fever Carolyn sick No abdominal pain    Objective:   Physical Exam Constitutional:      Appearance: Normal appearance.  Abdominal:     Palpations: Abdomen is soft.     Tenderness: There is no abdominal tenderness.  Neurological:     Mental Status: She is alert.            Assessment & Plan:

## 2023-03-27 NOTE — Addendum Note (Signed)
Addended by: Pilar Grammes on: 03/27/2023 12:36 PM   Modules accepted: Orders

## 2023-03-27 NOTE — Addendum Note (Signed)
Addended by: Pilar Grammes on: 03/27/2023 12:28 PM   Modules accepted: Orders

## 2023-03-27 NOTE — Assessment & Plan Note (Addendum)
Saw something floating in urine No symptoms of infection---and urinalysis is benign Will check urine pregnancy and then start depo-provera

## 2023-03-27 NOTE — Telephone Encounter (Signed)
Noted Will evaluate at the OV 

## 2023-03-28 LAB — WET PREP BY MOLECULAR PROBE
Candida species: NOT DETECTED
Gardnerella vaginalis: NOT DETECTED
MICRO NUMBER:: 14749217
SPECIMEN QUALITY:: ADEQUATE
Trichomonas vaginosis: NOT DETECTED

## 2023-04-10 IMAGING — CR DG CHEST 2V
1 series · 2 of 2 positions shown · non-contrast
Comparison: January 26, 2020.

CLINICAL DATA: Persistent cough.

EXAM:
CHEST - 2 VIEW

[Series 1: dg chest 2 view · 0.14mm/px · 2 of 2 slices shown]
[im 1/2]
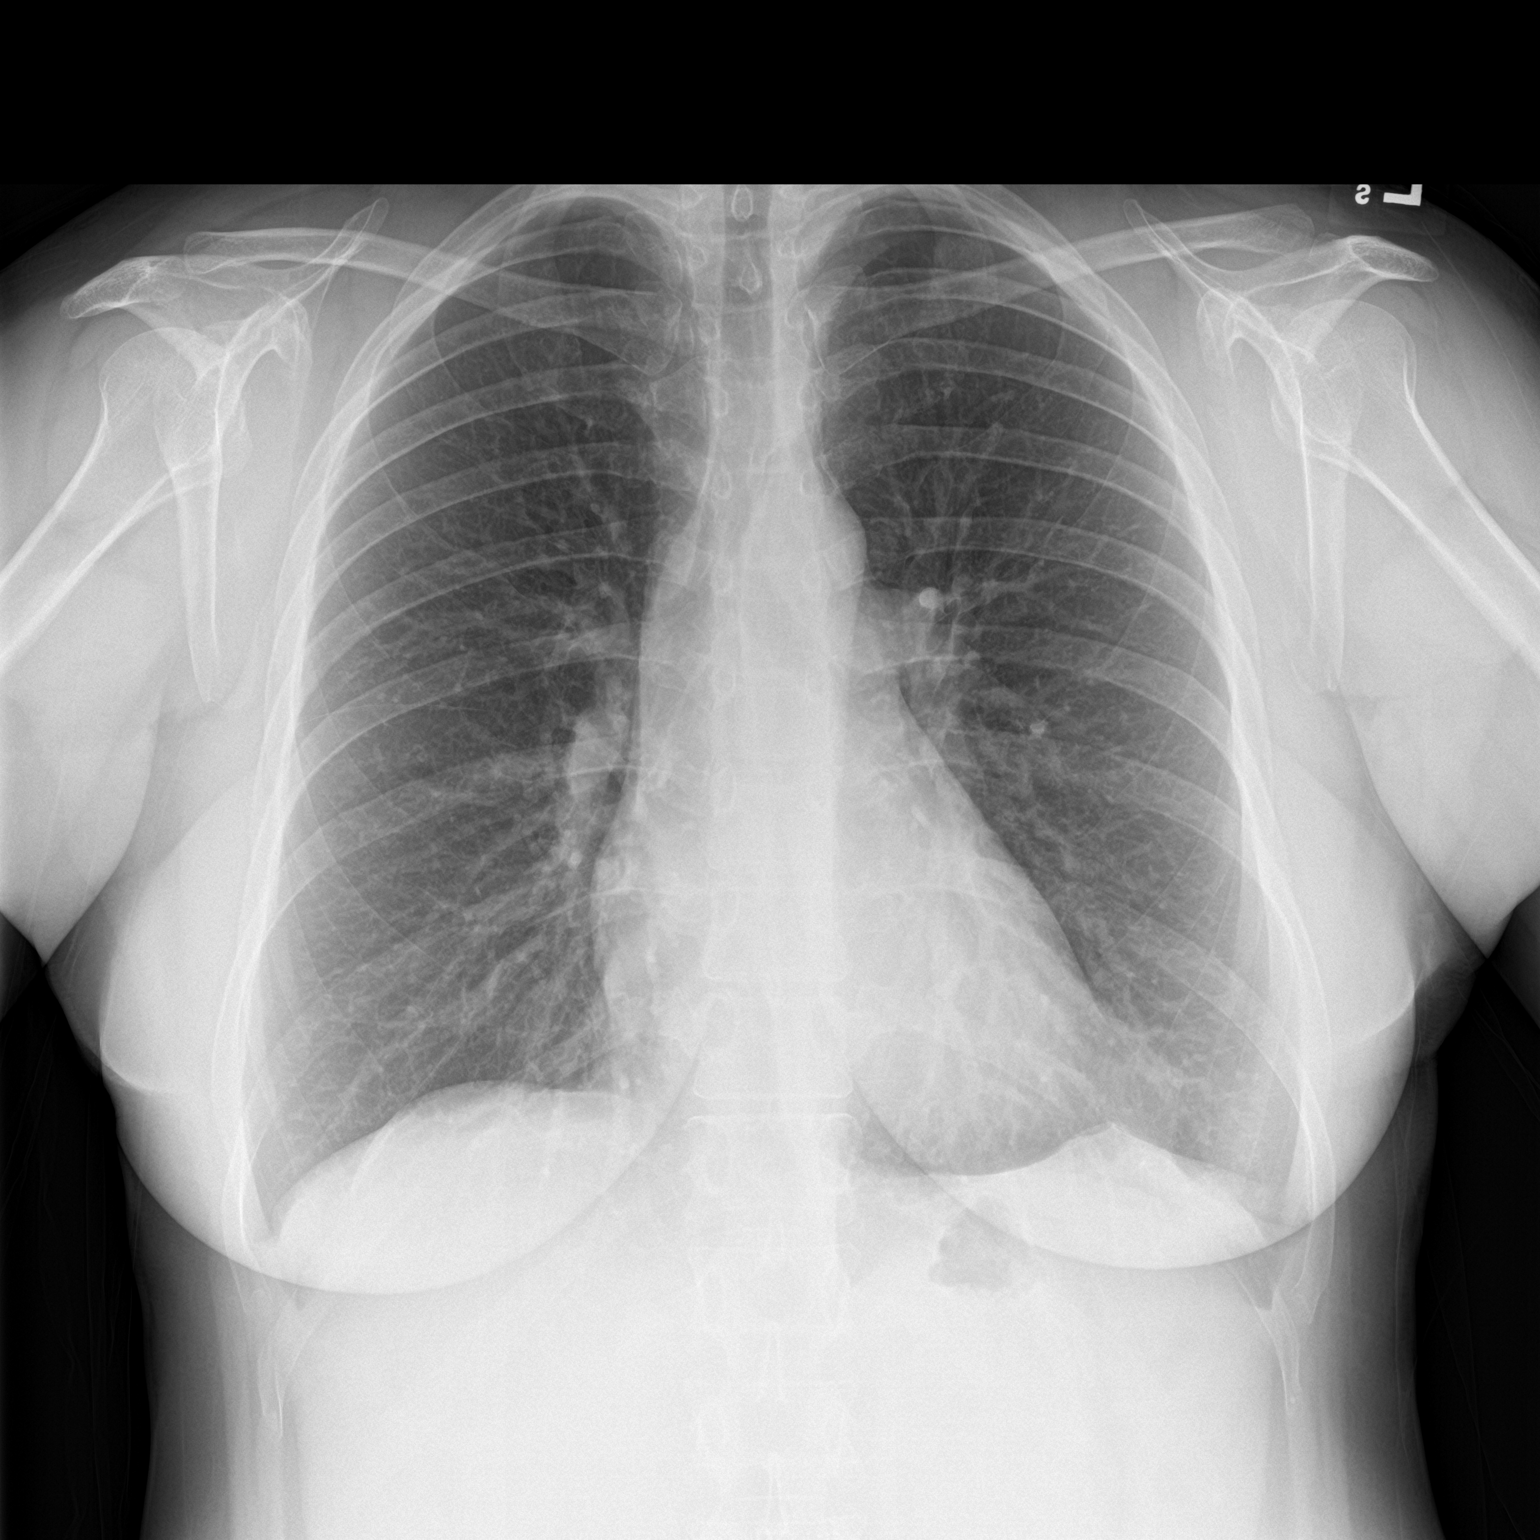
[im 2/2]
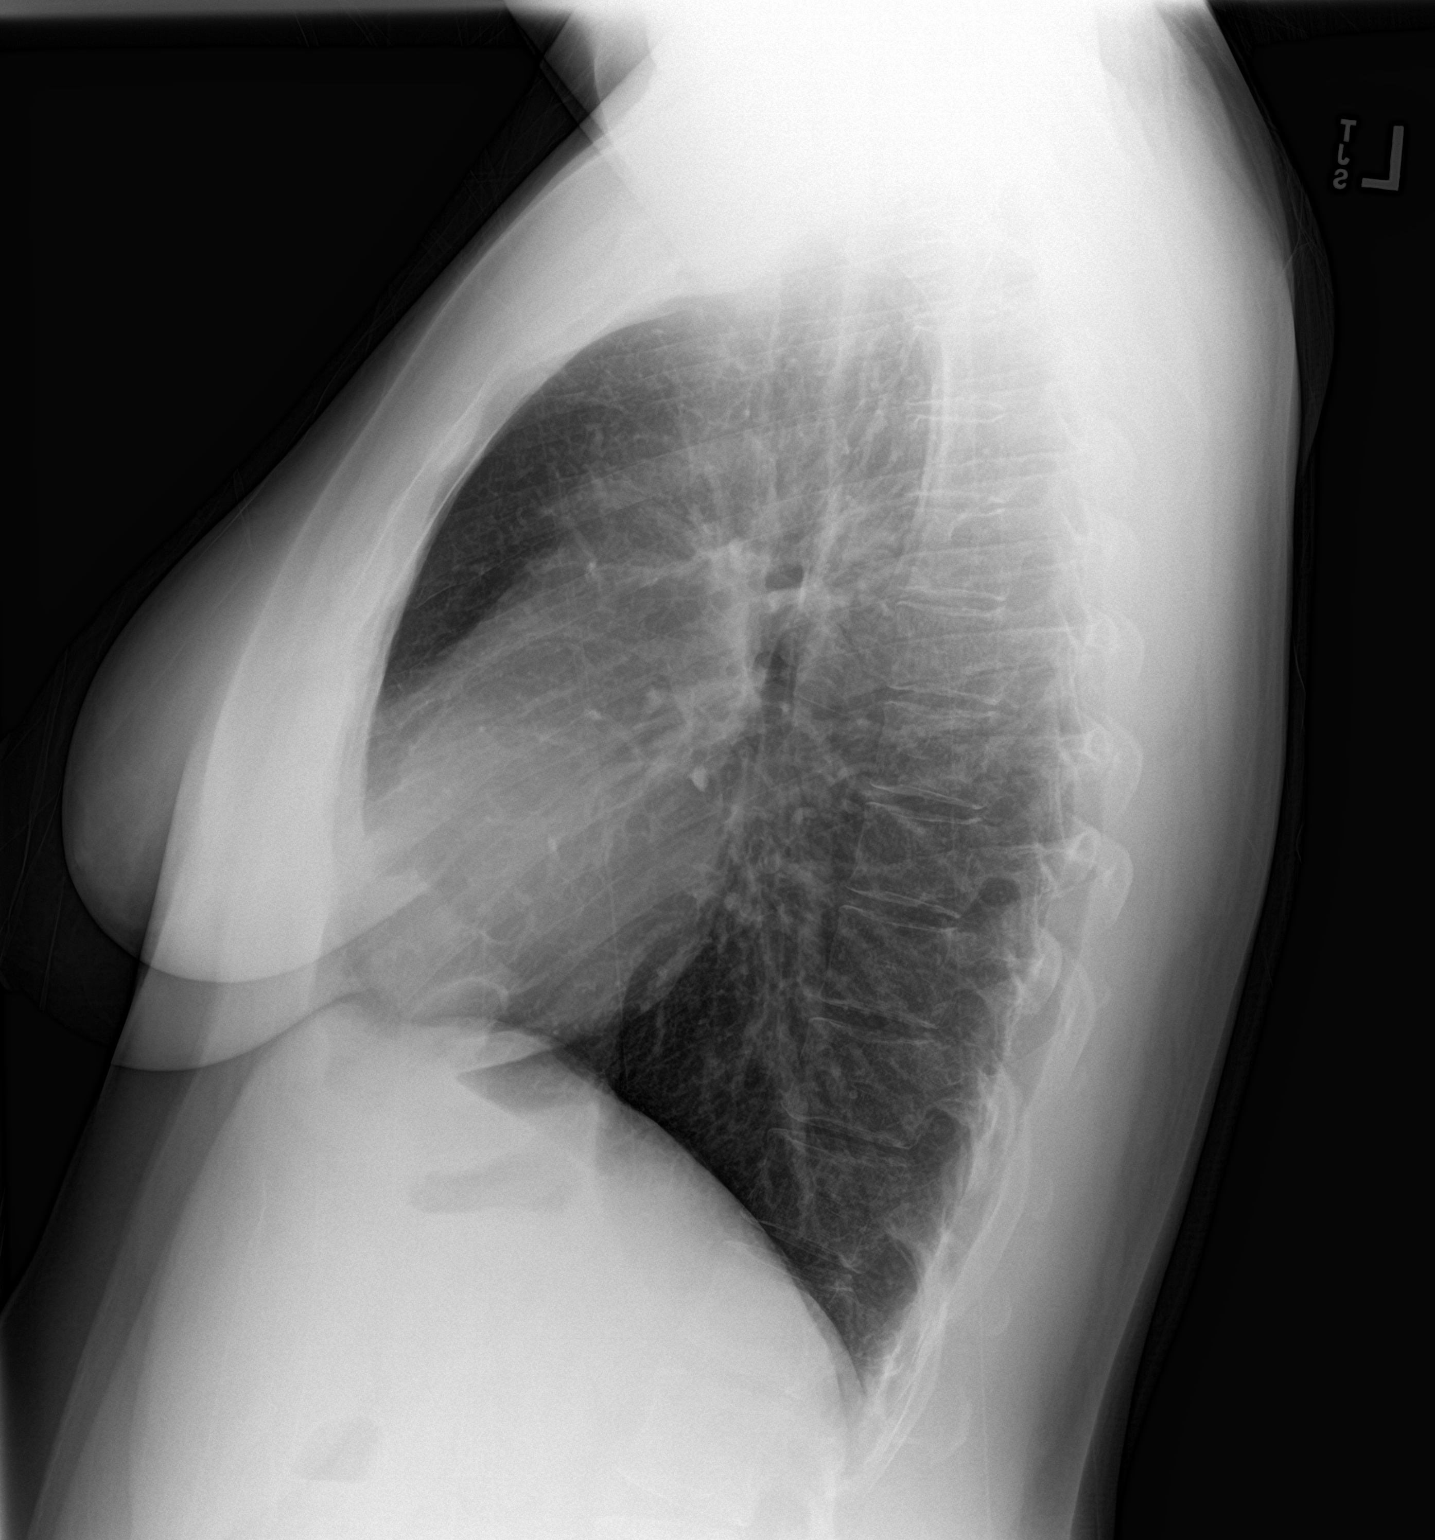

[2 of 2 positions shown; findings below may reference images not displayed]

FINDINGS: The heart size and mediastinal contours are within normal limits.
Both lungs are clear. The visualized skeletal structures are
unremarkable.
IMPRESSION: No active cardiopulmonary disease.

## 2023-06-10 ENCOUNTER — Telehealth: Payer: Self-pay | Admitting: Family

## 2023-06-10 ENCOUNTER — Encounter: Payer: Self-pay | Admitting: Family

## 2023-06-10 NOTE — Telephone Encounter (Signed)
Please advise 

## 2023-06-10 NOTE — Telephone Encounter (Signed)
Full panel blood work, cholesterol, thyroid  When should her next panel be? Patient okay with response via my chart

## 2023-06-27 ENCOUNTER — Ambulatory Visit (INDEPENDENT_AMBULATORY_CARE_PROVIDER_SITE_OTHER): Payer: Medicaid Other | Admitting: *Deleted

## 2023-06-27 DIAGNOSIS — Z3042 Encounter for surveillance of injectable contraceptive: Secondary | ICD-10-CM

## 2023-06-27 LAB — POCT URINE PREGNANCY: Preg Test, Ur: NEGATIVE

## 2023-06-27 MED ORDER — MEDROXYPROGESTERONE ACETATE 150 MG/ML IM SUSY
150.0000 mg | PREFILLED_SYRINGE | Freq: Once | INTRAMUSCULAR | Status: AC
  Administered 2023-06-27: 150 mg via INTRAMUSCULAR

## 2023-06-27 NOTE — Progress Notes (Signed)
Per orders of Dr. Crawford Givens (PCP out of the office), injection of Depo-Provera given by Blenda Mounts M  Patient tolerated injection well. Patient will make appointment in 3 month.  PCP out of the office, but she did advise me this morning since pt is only one day late to give her a pregnancy test and if negative proceed with injection. Urine pregnancy test was negative, injection given.

## 2023-08-02 ENCOUNTER — Other Ambulatory Visit: Payer: Self-pay | Admitting: Family

## 2023-08-02 ENCOUNTER — Encounter: Payer: Self-pay | Admitting: Family

## 2023-08-02 DIAGNOSIS — E039 Hypothyroidism, unspecified: Secondary | ICD-10-CM

## 2023-08-05 MED ORDER — LEVOTHYROXINE SODIUM 88 MCG PO TABS
ORAL_TABLET | ORAL | 0 refills | Status: DC
Start: 2023-08-05 — End: 2023-10-28

## 2023-10-27 ENCOUNTER — Other Ambulatory Visit: Payer: Self-pay | Admitting: Family

## 2023-10-27 ENCOUNTER — Encounter: Payer: Self-pay | Admitting: Family

## 2023-10-27 DIAGNOSIS — E039 Hypothyroidism, unspecified: Secondary | ICD-10-CM

## 2023-10-28 MED ORDER — LEVOTHYROXINE SODIUM 88 MCG PO TABS
ORAL_TABLET | ORAL | 0 refills | Status: DC
Start: 2023-10-28 — End: 2023-11-22

## 2023-10-31 ENCOUNTER — Encounter: Payer: Self-pay | Admitting: Family

## 2023-10-31 ENCOUNTER — Telehealth (INDEPENDENT_AMBULATORY_CARE_PROVIDER_SITE_OTHER): Payer: Self-pay | Admitting: Family

## 2023-10-31 VITALS — BP 110/74 | HR 82 | Temp 98.2°F | Ht 64.0 in | Wt 149.2 lb

## 2023-10-31 DIAGNOSIS — E039 Hypothyroidism, unspecified: Secondary | ICD-10-CM

## 2023-10-31 DIAGNOSIS — R350 Frequency of micturition: Secondary | ICD-10-CM

## 2023-10-31 LAB — POC URINALSYSI DIPSTICK (AUTOMATED)
Bilirubin, UA: NEGATIVE
Blood, UA: NEGATIVE
Glucose, UA: NEGATIVE
Ketones, UA: NEGATIVE
Leukocytes, UA: NEGATIVE
Nitrite, UA: NEGATIVE
Protein, UA: NEGATIVE
Spec Grav, UA: 1.01 (ref 1.010–1.025)
Urobilinogen, UA: 0.2 U/dL
pH, UA: 7 (ref 5.0–8.0)

## 2023-10-31 LAB — TSH: TSH: 3.46 u[IU]/mL (ref 0.35–5.50)

## 2023-10-31 NOTE — Progress Notes (Deleted)
   Established Patient Office Visit  Subjective:      CC:  Chief Complaint  Patient presents with   Medical Management of Chronic Issues    Pt would like to talk about coming off Levothyroxine. She is also having lots of urinary frequency.    HPI: Carolyn Bennett is a 32 y.o. female presenting on 10/31/2023 for Medical Management of Chronic Issues (Pt would like to talk about coming off Levothyroxine. She is also having lots of urinary frequency.) .    Social history:  Relevant past medical, surgical, family and social history reviewed and updated as indicated. Interim medical history since our last visit reviewed.  Allergies and medications reviewed and updated.  DATA REVIEWED: CHART IN EPIC     ROS: Negative unless specifically indicated above in HPI.    Current Outpatient Medications:    levothyroxine (SYNTHROID) 88 MCG tablet, Take one tablet po qd M-Sat, take two tablets on Sunday and repeat weekly, Disp: 30 tablet, Rfl: 0      Objective:    BP 110/74   Pulse 82   Temp 98.2 F (36.8 C) (Temporal)   Ht 5\' 4"  (1.626 m)   Wt 149 lb 3.2 oz (67.7 kg)   SpO2 100%   BMI 25.61 kg/m   Wt Readings from Last 3 Encounters:  10/31/23 149 lb 3.2 oz (67.7 kg)  03/27/23 152 lb (68.9 kg)  03/13/23 151 lb (68.5 kg)    Physical Exam Constitutional:      General: She is not in acute distress.    Appearance: Normal appearance. She is normal weight. She is not ill-appearing, toxic-appearing or diaphoretic.  HENT:     Head: Normocephalic.  Cardiovascular:     Rate and Rhythm: Normal rate.  Pulmonary:     Effort: Pulmonary effort is normal.  Musculoskeletal:        General: Normal range of motion.  Neurological:     General: No focal deficit present.     Mental Status: She is alert and oriented to person, place, and time. Mental status is at baseline.  Psychiatric:        Mood and Affect: Mood normal.        Behavior: Behavior normal.        Thought Content:  Thought content normal.        Judgment: Judgment normal.           Assessment & Plan:  Urinary frequency -     POCT Urinalysis Dipstick (Automated)  Acquired hypothyroidism Assessment & Plan: Did educate pt on acquired hypothyroid ad how it will require thyroid medication for life  She verbalized understanding.  Continue 88 mcg levothyroxine M-S once daily and take two every Sunday. Ordering TSH pending results, goal 1-2  Orders: -     TSH   Poc urine negative, suspect green tea due to it being a diuretic. Advised pt to make sure drinking water as well.   Return in about 1 year (around 10/30/2024) for f/u CPE.  Mort Sawyers, MSN, APRN, FNP-C [facility]

## 2023-10-31 NOTE — Assessment & Plan Note (Signed)
Did educate pt on acquired hypothyroid ad how it will require thyroid medication for life  She verbalized understanding.  Continue 88 mcg levothyroxine M-S once daily and take two every Sunday. Ordering TSH pending results, goal 1-2

## 2023-11-01 ENCOUNTER — Encounter: Payer: Self-pay | Admitting: Family

## 2023-11-01 DIAGNOSIS — E039 Hypothyroidism, unspecified: Secondary | ICD-10-CM

## 2023-11-02 ENCOUNTER — Encounter: Payer: Self-pay | Admitting: Family

## 2023-11-02 DIAGNOSIS — R350 Frequency of micturition: Secondary | ICD-10-CM

## 2023-11-11 NOTE — Progress Notes (Addendum)
Virtual Visit via Video note  I connected with [name] on 11/11/23 at our office by video and verified that I am speaking with the correct person using two identifiers.The provider, Mort Sawyers, FNP is located in their home at time of visit.  I discussed the limitations, risks, security and privacy concerns of performing an evaluation and management service by video and the availability of in person appointments. I also discussed with the patient that there may be a patient responsible charge related to this service. The patient expressed understanding and agreed to proceed.  Subjective: PCP: Mort Sawyers, FNP  Chief Complaint  Patient presents with   Medical Management of Chronic Issues    Pt would like to talk about coming off Levothyroxine. She is also having lots of urinary frequency.    HPI   Hypothyroid: ran out on Sunday , she has been taking levothyroxine 88 mcg once daily for six days, taking two every Sunday. Asymptomatic at current. Curious if she could stop the medication all together.    New complaints: Over the last few days with increased urinary frequency , otherwise no other symptoms. No fever chills or flank pain. No urgency or dysuria. Denies vaginal discharge or vaginal odor. She has started drinking hot green tea which is a new change for her.    Menses: was on depo, did not come for her repeat depo shot end of September as it was making her feel different she thinks it messes with her hormones. She does use pull out method with her husband.     ROS: Per HPI  Current Outpatient Medications:    levothyroxine (SYNTHROID) 88 MCG tablet, Take one tablet po qd M-Sat, take two tablets on Sunday and repeat weekly, Disp: 30 tablet, Rfl: 0  Observations/Objective: Physical Exam Constitutional:      General: She is not in acute distress.    Appearance: Normal appearance. She is not ill-appearing.  Pulmonary:     Effort: Pulmonary effort is normal.   Neurological:     General: No focal deficit present.     Mental Status: She is alert and oriented to person, place, and time.  Psychiatric:        Mood and Affect: Mood normal.        Behavior: Behavior normal.        Thought Content: Thought content normal.     Assessment and Plan: Urinary frequency -     POCT Urinalysis Dipstick (Automated)  Acquired hypothyroidism Assessment & Plan: Did educate pt on acquired hypothyroid ad how it will require thyroid medication for life  She verbalized understanding.  Continue 88 mcg levothyroxine M-S once daily and take two every Sunday. Ordering TSH pending results, goal 1-2  Orders: -     TSH    Follow Up Instructions: Return in about 1 year (around 10/30/2024) for f/u CPE.   I discussed the assessment and treatment plan with the patient. The patient was provided an opportunity to ask questions and all were answered. The patient agreed with the plan and demonstrated an understanding of the instructions.   The patient was advised to call back or seek an in-person evaluation if the symptoms worsen or if the condition fails to improve as anticipated.  The above assessment and management plan was discussed with the patient. The patient verbalized understanding of and has agreed to the management plan. Patient is aware to call the clinic if symptoms persist or worsen. Patient is aware when to  return to the clinic for a follow-up visit. Patient educated on when it is appropriate to go to the emergency department.     Mort Sawyers, MSN, APRN, FNP-C [facility]

## 2023-11-11 NOTE — Addendum Note (Signed)
Addended by: Mort Sawyers on: 11/11/2023 04:44 PM   Modules accepted: Level of Service

## 2023-11-22 ENCOUNTER — Encounter: Payer: Self-pay | Admitting: Family

## 2023-11-22 DIAGNOSIS — E039 Hypothyroidism, unspecified: Secondary | ICD-10-CM

## 2023-11-22 MED ORDER — LEVOTHYROXINE SODIUM 88 MCG PO TABS: ORAL_TABLET | ORAL | 0 refills | Status: DC

## 2023-11-22 NOTE — Addendum Note (Signed)
Addended by: Mort Sawyers on: 11/22/2023 11:31 AM   Modules accepted: Orders

## 2023-11-22 NOTE — Telephone Encounter (Signed)
Per last refill patient needed to be seen for more refills. Looks like she had virtual visit on 10-31-23

## 2023-12-15 ENCOUNTER — Encounter: Payer: Self-pay | Admitting: Family

## 2023-12-15 DIAGNOSIS — E039 Hypothyroidism, unspecified: Secondary | ICD-10-CM

## 2023-12-16 MED ORDER — LEVOTHYROXINE SODIUM 88 MCG PO TABS
ORAL_TABLET | ORAL | 5 refills | Status: DC
Start: 2023-12-16 — End: 2024-05-05

## 2023-12-26 ENCOUNTER — Encounter: Payer: Self-pay | Admitting: Family

## 2023-12-26 ENCOUNTER — Ambulatory Visit (INDEPENDENT_AMBULATORY_CARE_PROVIDER_SITE_OTHER): Payer: Medicaid Other | Admitting: Family

## 2023-12-26 ENCOUNTER — Telehealth: Payer: Self-pay

## 2023-12-26 VITALS — BP 126/72 | HR 75 | Temp 97.9°F | Ht 64.0 in | Wt 155.1 lb

## 2023-12-26 DIAGNOSIS — N926 Irregular menstruation, unspecified: Secondary | ICD-10-CM | POA: Diagnosis not present

## 2023-12-26 DIAGNOSIS — R300 Dysuria: Secondary | ICD-10-CM

## 2023-12-26 DIAGNOSIS — E039 Hypothyroidism, unspecified: Secondary | ICD-10-CM | POA: Diagnosis not present

## 2023-12-26 DIAGNOSIS — R82998 Other abnormal findings in urine: Secondary | ICD-10-CM

## 2023-12-26 LAB — CBC WITH DIFFERENTIAL/PLATELET
Basophils Absolute: 0 10*3/uL (ref 0.0–0.1)
Basophils Relative: 0.4 % (ref 0.0–3.0)
Eosinophils Absolute: 0.1 10*3/uL (ref 0.0–0.7)
Eosinophils Relative: 0.9 % (ref 0.0–5.0)
HCT: 41.1 % (ref 36.0–46.0)
Hemoglobin: 13.9 g/dL (ref 12.0–15.0)
Lymphocytes Relative: 24.6 % (ref 12.0–46.0)
Lymphs Abs: 2.1 10*3/uL (ref 0.7–4.0)
MCHC: 33.8 g/dL (ref 30.0–36.0)
MCV: 89.8 fL (ref 78.0–100.0)
Monocytes Absolute: 0.5 10*3/uL (ref 0.1–1.0)
Monocytes Relative: 5.7 % (ref 3.0–12.0)
Neutro Abs: 5.9 10*3/uL (ref 1.4–7.7)
Neutrophils Relative %: 68.4 % (ref 43.0–77.0)
Platelets: 291 10*3/uL (ref 150.0–400.0)
RBC: 4.58 Mil/uL (ref 3.87–5.11)
RDW: 12.3 % (ref 11.5–15.5)
WBC: 8.6 10*3/uL (ref 4.0–10.5)

## 2023-12-26 LAB — BASIC METABOLIC PANEL
BUN: 9 mg/dL (ref 6–23)
CO2: 24 meq/L (ref 19–32)
Calcium: 9.5 mg/dL (ref 8.4–10.5)
Chloride: 105 meq/L (ref 96–112)
Creatinine, Ser: 0.66 mg/dL (ref 0.40–1.20)
GFR: 114.95 mL/min (ref >60.00)
Glucose, Bld: 94 mg/dL (ref 70–99)
Potassium: 4.8 meq/L (ref 3.5–5.1)
Sodium: 139 meq/L (ref 135–145)

## 2023-12-26 LAB — POC URINALSYSI DIPSTICK (AUTOMATED)
Bilirubin, UA: NEGATIVE
Glucose, UA: NEGATIVE
Ketones, UA: NEGATIVE
Nitrite, UA: NEGATIVE
Protein, UA: NEGATIVE
Spec Grav, UA: 1.01 (ref 1.010–1.025)
Urobilinogen, UA: 0.2 U/dL
pH, UA: 6 (ref 5.0–8.0)

## 2023-12-26 LAB — FOLLICLE STIMULATING HORMONE: FSH: 3.8 m[IU]/mL

## 2023-12-26 LAB — TSH: TSH: 1.74 u[IU]/mL (ref 0.35–5.50)

## 2023-12-26 MED ORDER — SULFAMETHOXAZOLE-TRIMETHOPRIM 800-160 MG PO TABS: 1.0000 | ORAL_TABLET | Freq: Two times a day (BID) | ORAL | 0 refills | Status: AC

## 2023-12-26 NOTE — Progress Notes (Signed)
Established Patient Office Visit  Subjective:   Patient ID: Carolyn Bennett, female    DOB: 08-Oct-1991  Age: 32 y.o. MRN: [id number]  CC:  Chief Complaint  Patient presents with   Dysuria    C/o pain/burning when urinating and frequency. Sxs started yesterday. H/o UTIs.    HPI: Carolyn Bennett is a 32 y.o. female presenting on 12/26/2023 for Dysuria (C/o pain/burning when urinating and frequency. Sxs started yesterday. H/o UTIs.)   Yesterday started with urinary urgency and frequency along with dysuria. No fever, lower back pain but not flank pain. She is also on her cycle as well. No vaginal discharge other than menstrual. She is drinking water often, drinking tea as well (3-4 cups a day regularly). Urinating after sex. Doesn't drink a lot of sodas.   Was given keflex 500 mg tid x 7 days.  Irregular periods, just got over a period two weeks ago and is pretty heavy.  She did stop depo 06/2023 (she had her last    Hypothyroid: she has gained weight but she does admit has slipped on her diet.  She has noticed she is more tired than usual lately. On levothyroxine 88 mcg once daily every 6 days and then on the seventh day taking two.    ROS: Negative unless specifically indicated above in HPI.   Relevant past medical history reviewed and updated as indicated.   Allergies and medications reviewed and updated.   Current Outpatient Medications:    levothyroxine (SYNTHROID) 88 MCG tablet, Take one tablet po qd M-Sat, take two tablets on Sunday and repeat weekly, Disp: 30 tablet, Rfl: 5   sulfamethoxazole-trimethoprim (BACTRIM DS) 800-160 MG tablet, Take 1 tablet by mouth 2 (two) times daily for 7 days., Disp: 14 tablet, Rfl: 0  Allergies  Allergen Reactions   Depo-Provera [Medroxyprogesterone Acetate] Other (See Comments)    Made her feel weird with hormone fluctuation    Objective:   BP 126/72   Pulse 75   Temp 97.9 F (36.6 C) (Oral)   Ht 5\' 4"  (1.626 m)   Wt 155 lb 2 oz  (70.4 kg)   LMP 12/25/2023   SpO2 97%   BMI 26.63 kg/m    Physical Exam Constitutional:      General: She is not in acute distress.    Appearance: Normal appearance. She is normal weight. She is not ill-appearing, toxic-appearing or diaphoretic.  Cardiovascular:     Rate and Rhythm: Normal rate.  Pulmonary:     Effort: Pulmonary effort is normal.  Abdominal:     General: Abdomen is flat.     Tenderness: There is abdominal tenderness (suprapubic mild). There is no right CVA tenderness or left CVA tenderness.  Neurological:     General: No focal deficit present.     Mental Status: She is alert and oriented to person, place, and time. Mental status is at baseline.     Motor: No weakness.  Psychiatric:        Mood and Affect: Mood normal.        Behavior: Behavior normal.        Thought Content: Thought content normal.        Judgment: Judgment normal.     Assessment & Plan:  Dysuria -     POCT Urinalysis Dipstick (Automated) -     Urine Culture -     Basic metabolic panel  Leukocytes in urine Assessment & Plan: poct urine dip in office Urine culture ordered  pending results antbx sent to pharmacy, pt to take as directed. Encouraged increased water intake throughout the day. Choosing to treat due to being symptomatic. If no improvement in the next 2 days pt advised to let me know.  Hematuria likely from menses  Orders: -     Sulfamethoxazole-Trimethoprim; Take 1 tablet by mouth 2 (two) times daily for 7 days.  Dispense: 14 tablet; Refill: 0 -     Urine Culture -     Basic metabolic panel -     CBC with Differential/Platelet  Acquired hypothyroidism Assessment & Plan: Will repeat today as recent change in dosage  Goal 1-2  Orders: -     TSH  Irregular menses Assessment & Plan: Hormonal panel ordered however suspect this is due to d/c of depo end of September.  Can consider OCPs if needed Also r/o thyroid contributing with tsh ordered and pending  Orders: -      Follicle stimulating hormone -     Prolactin -     Testos,Total,Free and SHBG (Female)     Follow up plan: Return if symptoms worsen or fail to improve.  Mort Sawyers, FNP

## 2023-12-26 NOTE — Assessment & Plan Note (Signed)
poct urine dip in office Urine culture ordered pending results antbx sent to pharmacy, pt to take as directed. Encouraged increased water intake throughout the day. Choosing to treat due to being symptomatic. If no improvement in the next 2 days pt advised to let me know.  Hematuria likely from menses

## 2023-12-26 NOTE — Assessment & Plan Note (Signed)
Hormonal panel ordered however suspect this is due to d/c of depo end of September.  Can consider OCPs if needed Also r/o thyroid contributing with tsh ordered and pending

## 2023-12-26 NOTE — Telephone Encounter (Signed)
Saw patient already, thank you for speaking with the patient.  Please see note for further information.  

## 2023-12-26 NOTE — Assessment & Plan Note (Signed)
Will repeat today as recent change in dosage  Goal 1-2

## 2023-12-26 NOTE — Telephone Encounter (Signed)
Pt already at [facility] for appt with T Dugal FNP 12/26/23 at [facility]

## 2023-12-27 LAB — URINE CULTURE
MICRO NUMBER:: 15890696
Result:: NO GROWTH
SPECIMEN QUALITY:: ADEQUATE

## 2023-12-27 LAB — PROLACTIN: Prolactin: 5.2 ng/mL

## 2023-12-27 NOTE — Telephone Encounter (Signed)
Contacted [employer] and was informed I needed an NCID to speak with someone and told to use the automated. Per the automated system patient isn't eligible for benefits.

## 2023-12-30 ENCOUNTER — Encounter: Payer: Self-pay | Admitting: Family

## 2024-01-01 LAB — TESTOS,TOTAL,FREE AND SHBG (FEMALE)
Free Testosterone: 1.6 pg/mL (ref 0.1–6.4)
Sex Hormone Binding: 64.1 nmol/L (ref 17–124)
Testosterone, Total, LC-MS-MS: 16 ng/dL (ref 2–45)

## 2024-05-05 ENCOUNTER — Encounter: Payer: Self-pay | Admitting: Family

## 2024-05-05 DIAGNOSIS — E039 Hypothyroidism, unspecified: Secondary | ICD-10-CM

## 2024-05-05 MED ORDER — LEVOTHYROXINE SODIUM 88 MCG PO TABS: ORAL_TABLET | ORAL | 5 refills | Status: DC

## 2024-07-23 ENCOUNTER — Encounter: Payer: Self-pay | Admitting: Family

## 2024-09-16 ENCOUNTER — Ambulatory Visit (INDEPENDENT_AMBULATORY_CARE_PROVIDER_SITE_OTHER): Payer: Self-pay | Admitting: Family

## 2024-09-16 ENCOUNTER — Encounter: Payer: Self-pay | Admitting: Family

## 2024-09-16 VITALS — BP 110/70 | HR 64 | Temp 98.1°F | Ht 64.0 in | Wt 132.8 lb

## 2024-09-16 DIAGNOSIS — F32A Depression, unspecified: Secondary | ICD-10-CM

## 2024-09-16 DIAGNOSIS — F4323 Adjustment disorder with mixed anxiety and depressed mood: Secondary | ICD-10-CM

## 2024-09-16 DIAGNOSIS — F411 Generalized anxiety disorder: Secondary | ICD-10-CM

## 2024-09-16 MED ORDER — HYDROXYZINE HCL 10 MG PO TABS: 10.0000 mg | ORAL_TABLET | Freq: Three times a day (TID) | ORAL | 0 refills | Status: DC | PRN

## 2024-09-16 MED ORDER — SERTRALINE HCL 50 MG PO TABS: 50.0000 mg | ORAL_TABLET | Freq: Every day | ORAL | 3 refills | Status: DC
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
## 2024-09-21 ENCOUNTER — Encounter: Payer: Self-pay | Admitting: Family

## 2024-09-21 ENCOUNTER — Other Ambulatory Visit: Payer: Self-pay | Admitting: *Deleted

## 2024-09-21 DIAGNOSIS — E039 Hypothyroidism, unspecified: Secondary | ICD-10-CM

## 2024-09-21 MED ORDER — LEVOTHYROXINE SODIUM 88 MCG PO TABS: ORAL_TABLET | ORAL | 1 refills | Status: AC

## 2024-09-30 ENCOUNTER — Encounter: Payer: Self-pay | Admitting: Family

## 2024-09-30 NOTE — Telephone Encounter (Signed)
 Type of forms received: FMLA   Routed to: [employer] pool   Paperwork received by : Randall     Individual made aware of 3-5 business day turn around (Y/N): Y   Form completed and patient made aware of charges(Y/N): Y     Faxed to : number listed in paperwork   Form location:  in provider's upfront box

## 2024-09-30 NOTE — Telephone Encounter (Signed)
 I have not seen them either. I'll send Q along with Erin as well.   If not, can we call the pt and ask her if she can get a paper copy or an email from them that she could share the FMLA forms with us  so we can get this going for heR?

## 2024-09-30 NOTE — Telephone Encounter (Signed)
 Received [employer] forms dropped off by the patient at the office.  Spoke with patient, she is waiting to speak with provider to discuss dates on when to return before completing and faxing forms to employer.   Holding forms at my desk, if needed please let me know.

## 2024-10-01 NOTE — Telephone Encounter (Signed)
Forms have been placed in your inbox

## 2024-10-05 ENCOUNTER — Encounter: Payer: Self-pay | Admitting: Family

## 2024-10-05 MED ORDER — QUETIAPINE FUMARATE 25 MG PO TABS: 25.0000 mg | ORAL_TABLET | Freq: Every day | ORAL | 2 refills | Status: DC

## 2024-10-05 NOTE — Telephone Encounter (Signed)
 She finds her mind is racing, only sleeping about 2-3 hours before she wakes up again and feels 'she is coming out of a bad dream' her heart is racing, she has to pace around prior to lying down to go back to sleep.   Up and down with emotional lability, angry, and then crying. Emotions all over the place. She is having a lot of panic attacks and very overwhelmed.   Denies any suicidal thoughts.  No suicidal plans.

## 2024-10-05 NOTE — Addendum Note (Signed)
 Addended by: CORWIN ANTU on: 10/05/2024 09:39 AM   Modules accepted: Orders

## 2024-10-05 NOTE — Telephone Encounter (Signed)
 Forms have been faxed to [employer].  Pt notified her copy of the forms along with fax received confirmation is available to pick up at the front desk when convenient for her.

## 2024-10-05 NOTE — Telephone Encounter (Signed)
 Carolyn Bennett   Was my recent office note attached to the forms along with the medication list? If not could we please refax.  I had sent this message to lindsay but didn't realize she had already brought to you

## 2024-10-05 NOTE — Telephone Encounter (Signed)
 This has been completed.  Manuelita would you mind making a copy and attaching a copy of my last office note as well as med list and faxing?

## 2024-10-08 ENCOUNTER — Ambulatory Visit (INDEPENDENT_AMBULATORY_CARE_PROVIDER_SITE_OTHER): Payer: Self-pay | Admitting: Family

## 2024-10-08 ENCOUNTER — Ambulatory Visit: Payer: Self-pay | Admitting: Family

## 2024-10-08 ENCOUNTER — Encounter: Payer: Self-pay | Admitting: Family

## 2024-10-08 VITALS — BP 110/60 | HR 68 | Temp 98.0°F | Ht 64.0 in | Wt 131.8 lb

## 2024-10-08 DIAGNOSIS — N760 Acute vaginitis: Secondary | ICD-10-CM

## 2024-10-08 DIAGNOSIS — R350 Frequency of micturition: Secondary | ICD-10-CM

## 2024-10-08 DIAGNOSIS — Z202 Contact with and (suspected) exposure to infections with a predominantly sexual mode of transmission: Secondary | ICD-10-CM

## 2024-10-08 DIAGNOSIS — N898 Other specified noninflammatory disorders of vagina: Secondary | ICD-10-CM

## 2024-10-08 DIAGNOSIS — K644 Residual hemorrhoidal skin tags: Secondary | ICD-10-CM

## 2024-10-08 LAB — POCT URINE DIPSTICK
Bilirubin, UA: NEGATIVE
Blood, UA: NEGATIVE
Glucose, UA: NEGATIVE mg/dL
Ketones, POC UA: NEGATIVE mg/dL
Nitrite, UA: NEGATIVE
POC PROTEIN,UA: NEGATIVE
Spec Grav, UA: 1.01 (ref 1.010–1.025)
Urobilinogen, UA: NEGATIVE U/dL — AB
pH, UA: 7.5 (ref 5.0–8.0)

## 2024-10-08 MED ORDER — HYDROCORTISONE 2.5 % EX CREA: TOPICAL_CREAM | Freq: Two times a day (BID) | CUTANEOUS | 0 refills | Status: AC

## 2024-10-08 MED ORDER — METRONIDAZOLE 500 MG PO TABS: 500.0000 mg | ORAL_TABLET | Freq: Two times a day (BID) | ORAL | 0 refills | Status: AC

## 2024-10-08 NOTE — Progress Notes (Signed)
 Established Patient Office Visit  Subjective:      CC:  Chief Complaint  Patient presents with   Acute Visit    Possible yeast infection - itching, burning, white thick discharge    HPI: Carolyn Bennett is a 33 y.o. female presenting on 10/08/2024 for Acute Visit (Possible yeast infection - itching, burning, white thick discharge) .  Discussed the use of AI scribe software for clinical note transcription with the patient, who gave verbal consent to proceed.  History of Present Illness Carolyn Bennett is a 33 year old female who presents with itching and discomfort in the vaginal area.  She has been experiencing intense itching and discomfort in the vaginal area since Tuesday night, two days prior to the visit. The itching is severe, accompanied by a burning sensation, and is associated with a white, thick, milky discharge without any odor. No yellow or green discharge is present. She has been sexually active recently, with the last encounter occurring on Sunday.  She has a history of frequent urinary tract infections but states that the current symptoms do not resemble a UTI, as there is no frequency or urgency of urination. She reports burning during urination but specifies that it is not like a UTI. She has not noticed any lesions or bumps.  She recently started taking Seroquel two days ago. She has not taken any antibiotics recently. She mentions using a new scented soap, which she suspects might have  contributed to her symptoms.  She also reports having hemorrhoids since childbirth, which occasionally flare up. She reports having hemorrhoids since childbirth, which occasionally flare up and have not gone away since her baby was born.  She has children and has been considering staying with her grandmother temporarily due to personal circumstances.         Social history:  Relevant past medical, surgical, family and social history reviewed and updated as indicated. Interim medical history since our last visit reviewed.  Allergies and medications reviewed and updated.  DATA REVIEWED: CHART IN EPIC     ROS: Negative unless specifically indicated above in HPI.    Current Outpatient Medications:    hydrocortisone  2.5 % cream, Apply topically 2 (two) times daily., Disp: 30 g, Rfl: 0   hydrOXYzine  (ATARAX ) 10 MG tablet, Take 1 tablet (10 mg total) by mouth 3 (three) times daily as needed., Disp: 30 tablet, Rfl: 0   levothyroxine  (SYNTHROID ) 88 MCG tablet, Take one tablet po qd M-Sat, take two tablets on Sunday and repeat weekly, Disp: 90 tablet, Rfl: 1   metroNIDAZOLE  (FLAGYL ) 500 MG tablet, Take 1 tablet (500 mg total) by mouth 2 (two) times daily for 7 days., Disp: 14 tablet, Rfl: 0   QUEtiapine (SEROQUEL) 25 MG tablet, Take 1 tablet (25 mg total) by mouth at bedtime., Disp: 30 tablet, Rfl: 2   sertraline  (ZOLOFT ) 50 MG tablet, Take 1 tablet (50 mg total) by mouth daily., Disp: 30 tablet, Rfl: 3        Objective:        BP 110/60 (BP Location: Right Arm, Patient Position: Sitting, Cuff Size: Normal)   Pulse 68   Temp 98 F (36.7 C) (Temporal)   Ht 5' 4 (1.626 m)   Wt 131 lb 12.8 oz (59.8 kg)   SpO2 100%   BMI 22.62 kg/m   Physical Exam GENITOURINARY: Vagina inflamed and erythematous. RECTAL: Hemorrhoids present.  Wt Readings from Last 3 Encounters:  10/08/24 131 lb 12.8 oz (59.8 kg)  09/16/24 132 lb 12.8 oz (60.2 kg)  12/26/23 155 lb 2 oz (70.4 kg)     Physical Exam Genitourinary:    Labia:        Right: Rash and tenderness present.        Left: Rash and tenderness present.      Urethra: No prolapse.     Vagina: Vaginal discharge (white thin millky) present.     Cervix: No erythema.     Comments: Swelling bil minora         Results LABS Urinalysis: elevated leukocytes, no nitrites  Assessment & Plan:   Assessment and Plan Assessment & Plan Bacterial vaginosis (acute vaginitis) Acute bacterial vaginosis presenting with itching, burning, and white, milky discharge, initiated two days ago post recent sexual activity. Examination shows inflammation and redness. Differential diagnosis includes yeast infection, but presentation aligns more with bacterial vaginosis. Not sexually transmitted but can be triggered by changes in vaginal flora. - Prescribe metronidazole  (Flagyl ) 500 mg orally twice a day for 7 days. - Perform STD screening including tests for chlamydia, gonorrhea, trichomonas, hepatitis, HIV, and syphilis. - Advise against alcohol consumption while on metronidazole . - Discuss potential triggers for bacterial vaginosis, including changes in personal hygiene products. - Advise to use unscented soap and avoid scented products in the genital area. - Order wet prep to confirm diagnosis and rule out yeast infection. - Advise to inform lab of self-pay status for potential discount.  Hemorrhoids Chronic hemorrhoids, likely exacerbated by recent childbirth, currently inflamed and causing discomfort. Hemorrhoids may decrease in size with treatment but may not completely resolve without surgical intervention. - Prescribe hydrocortisone  2% cream for application to hemorrhoids to reduce inflammation and discomfort.        Return if symptoms worsen or fail to improve.     Ginger Patrick, MSN, APRN, FNP-C [facility]

## 2024-10-09 ENCOUNTER — Ambulatory Visit: Payer: Self-pay | Admitting: Family

## 2024-10-09 LAB — WET PREP BY MOLECULAR PROBE
Candida species: DETECTED — AB
Gardnerella vaginalis: NOT DETECTED
MICRO NUMBER:: 17079107
SPECIMEN QUALITY:: ADEQUATE
Trichomonas vaginosis: NOT DETECTED

## 2024-10-09 LAB — HIV ANTIBODY (ROUTINE TESTING W REFLEX)
HIV 1&2 Ab, 4th Generation: NONREACTIVE
HIV FINAL INTERPRETATION: NEGATIVE

## 2024-10-09 LAB — HEPATITIS C ANTIBODY: Hepatitis C Ab: NONREACTIVE

## 2024-10-09 LAB — RPR: RPR Ser Ql: NONREACTIVE

## 2024-10-09 LAB — C. TRACHOMATIS/N. GONORRHOEAE RNA
C. trachomatis RNA, TMA: NOT DETECTED
N. gonorrhoeae RNA, TMA: NOT DETECTED

## 2024-10-10 ENCOUNTER — Encounter: Payer: Self-pay | Admitting: Family

## 2024-10-11 ENCOUNTER — Encounter: Payer: Self-pay | Admitting: Family

## 2024-10-12 MED ORDER — FLUCONAZOLE 150 MG PO TABS: 150.0000 mg | ORAL_TABLET | Freq: Once | ORAL | 0 refills | Status: AC

## 2024-10-12 NOTE — Telephone Encounter (Signed)
 Duplicate message. See message from 10/10/24.

## 2024-10-18 ENCOUNTER — Encounter: Payer: Self-pay | Admitting: Family

## 2024-10-18 DIAGNOSIS — N898 Other specified noninflammatory disorders of vagina: Secondary | ICD-10-CM

## 2024-10-20 MED ORDER — METRONIDAZOLE 500 MG PO TABS: 500.0000 mg | ORAL_TABLET | Freq: Three times a day (TID) | ORAL | 0 refills | Status: DC

## 2024-10-20 MED ORDER — METRONIDAZOLE 500 MG PO TABS: 500.0000 mg | ORAL_TABLET | Freq: Two times a day (BID) | ORAL | 0 refills | Status: AC

## 2024-10-29 ENCOUNTER — Encounter: Payer: Self-pay | Admitting: Family

## 2024-10-29 DIAGNOSIS — F4323 Adjustment disorder with mixed anxiety and depressed mood: Secondary | ICD-10-CM

## 2024-10-29 DIAGNOSIS — F411 Generalized anxiety disorder: Secondary | ICD-10-CM

## 2024-10-30 MED ORDER — HYDROXYZINE HCL 10 MG PO TABS: 10.0000 mg | ORAL_TABLET | Freq: Three times a day (TID) | ORAL | 0 refills | Status: DC | PRN

## 2024-10-30 NOTE — Telephone Encounter (Signed)
 Called in her hydroxyzine    Carolyn Bennett can you check if I put an anticipated return date on her fmla? And also if we put on there intermittent upon return?

## 2024-11-02 NOTE — Telephone Encounter (Signed)
 Manuelita can you print out [employer] fmla forms and put in my inbox? I have to fix the intermittent fmla appts

## 2024-11-03 ENCOUNTER — Other Ambulatory Visit: Payer: Self-pay | Admitting: Family

## 2024-11-03 DIAGNOSIS — F32A Depression, unspecified: Secondary | ICD-10-CM

## 2024-11-03 MED ORDER — QUETIAPINE FUMARATE 50 MG PO TABS: 50.0000 mg | ORAL_TABLET | Freq: Every day | ORAL | Status: DC

## 2024-11-18 ENCOUNTER — Encounter: Payer: Self-pay | Admitting: Family

## 2024-11-18 DIAGNOSIS — F4323 Adjustment disorder with mixed anxiety and depressed mood: Secondary | ICD-10-CM

## 2024-11-18 DIAGNOSIS — F411 Generalized anxiety disorder: Secondary | ICD-10-CM

## 2024-11-30 MED ORDER — HYDROXYZINE HCL 10 MG PO TABS: 10.0000 mg | ORAL_TABLET | Freq: Three times a day (TID) | ORAL | 0 refills | Status: AC | PRN

## 2024-11-30 NOTE — Addendum Note (Signed)
 Addended by: CORWIN ANTU on: 11/30/2024 09:25 AM   Modules accepted: Orders

## 2024-12-16 ENCOUNTER — Other Ambulatory Visit: Payer: Self-pay | Admitting: *Deleted

## 2024-12-16 DIAGNOSIS — F419 Anxiety disorder, unspecified: Secondary | ICD-10-CM

## 2024-12-16 MED ORDER — QUETIAPINE FUMARATE 50 MG PO TABS: 50.0000 mg | ORAL_TABLET | Freq: Every day | ORAL | 2 refills | Status: AC

## 2024-12-28 ENCOUNTER — Other Ambulatory Visit: Payer: Self-pay | Admitting: *Deleted

## 2024-12-28 DIAGNOSIS — F4323 Adjustment disorder with mixed anxiety and depressed mood: Secondary | ICD-10-CM

## 2024-12-28 MED ORDER — SERTRALINE HCL 50 MG PO TABS: 50.0000 mg | ORAL_TABLET | Freq: Every day | ORAL | 3 refills | Status: AC

## 2025-02-05 ENCOUNTER — Other Ambulatory Visit: Payer: Self-pay | Admitting: *Deleted

## 2025-02-05 DIAGNOSIS — F411 Generalized anxiety disorder: Secondary | ICD-10-CM

## 2025-02-05 DIAGNOSIS — F4323 Adjustment disorder with mixed anxiety and depressed mood: Secondary | ICD-10-CM
